# Patient Record
Sex: Female | Born: 1945
Health system: Southern US, Community
[De-identification: ages and names within clinical notes are randomized; demographics above are authoritative.]

## PROBLEM LIST (undated history)

## (undated) DIAGNOSIS — H409 Unspecified glaucoma: Secondary | ICD-10-CM

## (undated) DIAGNOSIS — T7840XA Allergy, unspecified, initial encounter: Secondary | ICD-10-CM

## (undated) DIAGNOSIS — E785 Hyperlipidemia, unspecified: Secondary | ICD-10-CM

## (undated) DIAGNOSIS — E119 Type 2 diabetes mellitus without complications: Secondary | ICD-10-CM

## (undated) DIAGNOSIS — Z0120 Encounter for dental examination and cleaning without abnormal findings: Secondary | ICD-10-CM

## (undated) DIAGNOSIS — I10 Essential (primary) hypertension: Secondary | ICD-10-CM

## (undated) HISTORY — DX: Unspecified glaucoma: H40.9

## (undated) HISTORY — DX: Type 2 diabetes mellitus without complications: E11.9

## (undated) HISTORY — DX: Hyperlipidemia, unspecified: E78.5

## (undated) HISTORY — DX: Essential (primary) hypertension: I10

## (undated) HISTORY — DX: Allergy, unspecified, initial encounter: T78.40XA

---
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
## 1986-07-29 HISTORY — PX: ABDOMINAL HYSTERECTOMY: SHX81

## 2007-03-02 ENCOUNTER — Ambulatory Visit: Payer: Self-pay

## 2008-03-10 ENCOUNTER — Ambulatory Visit: Payer: Self-pay

## 2009-01-26 HISTORY — PX: COLONOSCOPY: SHX174

## 2009-02-17 ENCOUNTER — Ambulatory Visit: Payer: Self-pay | Admitting: General Surgery

## 2009-03-16 ENCOUNTER — Ambulatory Visit: Payer: Self-pay

## 2010-03-08 ENCOUNTER — Ambulatory Visit: Payer: Self-pay

## 2011-02-28 ENCOUNTER — Ambulatory Visit: Payer: Self-pay

## 2012-03-05 ENCOUNTER — Ambulatory Visit: Payer: Self-pay | Admitting: Internal Medicine

## 2012-04-01 ENCOUNTER — Ambulatory Visit: Payer: Self-pay | Admitting: Family Medicine

## 2013-03-29 LAB — HM PAP SMEAR: HM Pap smear: NORMAL

## 2013-04-08 ENCOUNTER — Ambulatory Visit: Payer: Self-pay | Admitting: Internal Medicine

## 2013-04-27 LAB — HM MAMMOGRAPHY: HM MAMMO: NORMAL

## 2014-04-27 ENCOUNTER — Ambulatory Visit: Payer: Self-pay | Admitting: Internal Medicine

## 2015-01-11 DIAGNOSIS — T7840XA Allergy, unspecified, initial encounter: Secondary | ICD-10-CM | POA: Insufficient documentation

## 2015-01-11 DIAGNOSIS — R600 Localized edema: Secondary | ICD-10-CM | POA: Insufficient documentation

## 2015-01-11 DIAGNOSIS — I10 Essential (primary) hypertension: Secondary | ICD-10-CM | POA: Insufficient documentation

## 2015-01-13 ENCOUNTER — Encounter: Payer: Self-pay | Admitting: Internal Medicine

## 2015-01-13 ENCOUNTER — Ambulatory Visit (INDEPENDENT_AMBULATORY_CARE_PROVIDER_SITE_OTHER): Payer: 59 | Admitting: Internal Medicine

## 2015-01-13 ENCOUNTER — Encounter (INDEPENDENT_AMBULATORY_CARE_PROVIDER_SITE_OTHER): Payer: Self-pay

## 2015-01-13 VITALS — BP 142/88 | HR 64 | Ht 61.0 in | Wt 150.6 lb

## 2015-01-13 DIAGNOSIS — I10 Essential (primary) hypertension: Secondary | ICD-10-CM | POA: Diagnosis not present

## 2015-01-13 DIAGNOSIS — M5432 Sciatica, left side: Secondary | ICD-10-CM | POA: Diagnosis not present

## 2015-01-13 NOTE — Progress Notes (Signed)
Date:  01/13/2015   Name:  Bethany Martinez   DOB:  10-Mar-1946   MRN:  [id number]   Chief Complaint: Numbness HPI - had tingling in left foot and toes one day last week.  It started after lifting a pressure washer.  She had some low back discomfort as well but after taking an aspirin she has had no further issues.   Review of Systems:  Review of Systems  Respiratory: Negative for apnea, cough and shortness of breath.   Cardiovascular: Positive for leg swelling (for a few days). Negative for chest pain and palpitations.  Gastrointestinal: Negative for abdominal pain.  Genitourinary: Negative for dysuria, urgency and pelvic pain.  Musculoskeletal: Negative for back pain, joint swelling and gait problem.  Neurological: Negative for syncope, light-headedness, numbness and headaches.    Patient Active Problem List   Diagnosis Date Noted  . Dyslipidemia 01/11/2015  . Edema extremities 01/11/2015  . Allergic state 01/11/2015  . Essential (primary) hypertension 01/11/2015    Prior to Admission medications   Medication Sig Start Date End Date Taking? Authorizing Provider  amLODipine (NORVASC) 10 MG tablet Take 1 tablet by mouth daily. 06/08/14  Yes Historical Provider, MD  atorvastatin (LIPITOR) 20 MG tablet Take 1 tablet by mouth daily. 08/09/14  Yes Historical Provider, MD  hydrochlorothiazide (HYDRODIURIL) 25 MG tablet Take 1 tablet by mouth daily. 06/08/14  Yes Historical Provider, MD    No Known Allergies  Past Surgical History  Procedure Laterality Date  . Abdominal hysterectomy  1988    partial    History  Substance Use Topics  . Smoking status: Never Smoker   . Smokeless tobacco: Not on file  . Alcohol Use: No     Medication list has been reviewed and updated.  Physical Examination:  Physical Exam  Constitutional: She is oriented to person, place, and time. She appears well-developed and well-nourished.  Eyes: Pupils are equal, round, and reactive to light.   Neck: Neck supple. No thyromegaly present.  Cardiovascular: Normal rate, regular rhythm and normal heart sounds.   Pulses:      Dorsalis pedis pulses are 1+ on the right side, and 1+ on the left side.       Posterior tibial pulses are 1+ on the right side, and 1+ on the left side.  Pulmonary/Chest: Breath sounds normal. She has no wheezes.  Musculoskeletal: She exhibits no edema (trace ankle edema bilaterally).       Right hip: Normal.       Left hip: Normal.       Lumbar back: She exhibits no tenderness, no swelling and no edema.  Straight leg raise negative on left  Lymphadenopathy:    She has no cervical adenopathy.  Neurological: She is alert and oriented to person, place, and time. She has normal reflexes.  Skin: Skin is warm and dry.  Psychiatric: She has a normal mood and affect.    BP 142/88 mmHg  Pulse 64  Ht 5\' 1"  (1.549 m)  Wt 150 lb 9.6 oz (68.312 kg)  BMI 28.47 kg/m2  Assessment and Plan: 1. Essential (primary) hypertension Controlled - continue current therapy  2. Sciatica, left Now resolved - if recurrent may take aleve or aspirin Avoid heavy lifting   Halina Maidens, MD [facility]  01/13/2015

## 2015-04-12 ENCOUNTER — Encounter: Payer: Self-pay | Admitting: Internal Medicine

## 2015-04-12 ENCOUNTER — Ambulatory Visit (INDEPENDENT_AMBULATORY_CARE_PROVIDER_SITE_OTHER): Payer: 59 | Admitting: Internal Medicine

## 2015-04-12 VITALS — BP 142/78 | HR 76 | Ht 62.0 in | Wt 150.8 lb

## 2015-04-12 DIAGNOSIS — R21 Rash and other nonspecific skin eruption: Secondary | ICD-10-CM

## 2015-04-12 NOTE — Progress Notes (Signed)
Date:  04/12/2015   Name:  Bethany Martinez   DOB:  1946-02-17   MRN:  [id number]   Chief Complaint: Rash  patient developed a rash on the right side of her neck about 2 months ago. It was not symptomatic but she began applying cortisone cream. The rash is not improved or changed with regard to symptoms. The PA in urgent care told her to stop applying the cream and have it looked at. She denies any other rash on other parts of her body and no history of similar rash. She has not been seen by dermatology. Review of Systems:  Review of Systems  Constitutional: Negative.   Respiratory: Negative for chest tightness, shortness of breath and wheezing.   Cardiovascular: Negative for chest pain and palpitations.  Musculoskeletal: Negative for myalgias, joint swelling and arthralgias.  Skin: Positive for color change and rash.    Patient Active Problem List   Diagnosis Date Noted  . Dyslipidemia 01/11/2015  . Edema extremities 01/11/2015  . Allergic state 01/11/2015  . Essential (primary) hypertension 01/11/2015    Prior to Admission medications   Medication Sig Start Date End Date Taking? Authorizing Provider  amLODipine (NORVASC) 10 MG tablet Take 1 tablet by mouth daily. 06/08/14  Yes Historical Provider, MD  atorvastatin (LIPITOR) 20 MG tablet Take 1 tablet by mouth daily. 08/09/14  Yes Historical Provider, MD  brimonidine-timolol (COMBIGAN) 0.2-0.5 % ophthalmic solution Place 1 drop into both eyes every 12 (twelve) hours.   Yes Historical Provider, MD  hydrochlorothiazide (HYDRODIURIL) 25 MG tablet Take 1 tablet by mouth daily. 06/08/14  Yes Historical Provider, MD    No Known Allergies  Past Surgical History  Procedure Laterality Date  . Abdominal hysterectomy  1988    partial    Social History  Substance Use Topics  . Smoking status: Never Smoker   . Smokeless tobacco: None  . Alcohol Use: No     Medication list has been reviewed and updated.  Physical  Examination:  Physical Exam  Constitutional: She appears well-developed and well-nourished.  Neck: Normal range of motion. Neck supple. No thyromegaly present.  Cardiovascular: Normal rate, regular rhythm and normal heart sounds.   Pulmonary/Chest: Effort normal and breath sounds normal.  Lymphadenopathy:    She has no cervical adenopathy.  Skin: Skin is warm and dry. Rash noted. Rash is macular.  Multiple tiny hyperpigmented macules are noted over the right side of the neck and just anterior to the ear. These are nontender and noninflamed.  Nursing note and vitals reviewed.   BP 142/78 mmHg  Pulse 76  Ht 5\' 2"  (1.575 m)  Wt 150 lb 12.8 oz (68.402 kg)  BMI 27.57 kg/m2  Assessment and Plan: 1. Rash of neck Discontinue cortisone cream topically Schedule consultation with dermatology   Halina Maidens, MD [facility]  04/12/2015

## 2015-05-09 ENCOUNTER — Other Ambulatory Visit: Payer: Self-pay | Admitting: Internal Medicine

## 2015-05-09 DIAGNOSIS — Z1231 Encounter for screening mammogram for malignant neoplasm of breast: Secondary | ICD-10-CM

## 2015-05-23 ENCOUNTER — Ambulatory Visit
Admission: RE | Admit: 2015-05-23 | Discharge: 2015-05-23 | Disposition: A | Discharge status: Home / Self Care | Payer: 59 | Source: Ambulatory Visit | Attending: Internal Medicine | Admitting: Internal Medicine

## 2015-05-23 DIAGNOSIS — Z1231 Encounter for screening mammogram for malignant neoplasm of breast: Secondary | ICD-10-CM | POA: Insufficient documentation

## 2015-05-25 ENCOUNTER — Other Ambulatory Visit: Payer: Self-pay | Admitting: Internal Medicine

## 2015-06-07 ENCOUNTER — Ambulatory Visit (INDEPENDENT_AMBULATORY_CARE_PROVIDER_SITE_OTHER): Payer: 59 | Admitting: Internal Medicine

## 2015-06-07 ENCOUNTER — Encounter: Payer: Self-pay | Admitting: Internal Medicine

## 2015-06-07 VITALS — BP 136/76 | HR 68 | Ht 62.0 in | Wt 147.2 lb

## 2015-06-07 DIAGNOSIS — R7303 Prediabetes: Secondary | ICD-10-CM | POA: Diagnosis not present

## 2015-06-07 DIAGNOSIS — E785 Hyperlipidemia, unspecified: Secondary | ICD-10-CM

## 2015-06-07 DIAGNOSIS — Z23 Encounter for immunization: Secondary | ICD-10-CM | POA: Diagnosis not present

## 2015-06-07 DIAGNOSIS — R609 Edema, unspecified: Secondary | ICD-10-CM | POA: Diagnosis not present

## 2015-06-07 DIAGNOSIS — I10 Essential (primary) hypertension: Secondary | ICD-10-CM | POA: Diagnosis not present

## 2015-06-07 DIAGNOSIS — R600 Localized edema: Secondary | ICD-10-CM

## 2015-06-07 DIAGNOSIS — E118 Type 2 diabetes mellitus with unspecified complications: Secondary | ICD-10-CM | POA: Insufficient documentation

## 2015-06-07 DIAGNOSIS — E119 Type 2 diabetes mellitus without complications: Secondary | ICD-10-CM

## 2015-06-07 MED ORDER — ATORVASTATIN CALCIUM 20 MG PO TABS: 20.0000 mg | ORAL_TABLET | Freq: Every day | ORAL | Status: DC

## 2015-06-07 NOTE — Patient Instructions (Addendum)
Breast Self-Awareness Practicing breast self-awareness may pick up problems early, prevent significant medical complications, and possibly save your life. By practicing breast self-awareness, you can become familiar with how your breasts look and feel and if your breasts are changing. This allows you to notice changes early. It can also offer you some reassurance that your breast health is good. One way to learn what is normal for your breasts and whether your breasts are changing is to do a breast self-exam. If you find a lump or something that was not present in the past, it is best to contact your caregiver right away. Other findings that should be evaluated by your caregiver include nipple discharge, especially if it is bloody; skin changes or reddening; areas where the skin seems to be pulled in (retracted); or new lumps and bumps. Breast pain is seldom associated with cancer (malignancy), but should also be evaluated by a caregiver. HOW TO PERFORM A BREAST SELF-EXAM The best time to examine your breasts is 5-7 days after your menstrual period is over. During menstruation, the breasts are lumpier, and it may be more difficult to pick up changes. If you do not menstruate, have reached menopause, or had your uterus removed (hysterectomy), you should examine your breasts at regular intervals, such as monthly. If you are breastfeeding, examine your breasts after a feeding or after using a breast pump. Breast implants do not decrease the risk for lumps or tumors, so continue to perform breast self-exams as recommended. Talk to your caregiver about how to determine the difference between the implant and breast tissue. Also, talk about the amount of pressure you should use during the exam. Over time, you will become more familiar with the variations of your breasts and more comfortable with the exam. A breast self-exam requires you to remove all your clothes above the waist.  Look at your breasts and nipples.  Stand in front of a mirror in a room with good lighting. With your hands on your hips, push your hands firmly downward. Look for a difference in shape, contour, and size from one breast to the other (asymmetry). Asymmetry includes puckers, dips, or bumps. Also, look for skin changes, such as reddened or scaly areas on the breasts. Look for nipple changes, such as discharge, dimpling, repositioning, or redness.  Carefully feel your breasts. This is best done either in the shower or tub while using soapy water or when flat on your back. Place the arm (on the side of the breast you are examining) above your head. Use the pads (not the fingertips) of your three middle fingers on your opposite hand to feel your breasts. Start in the underarm area and use  inch (2 cm) overlapping circles to feel your breast. Use 3 different levels of pressure (light, medium, and firm pressure) at each circle before moving to the next circle. The light pressure is needed to feel the tissue closest to the skin. The medium pressure will help to feel breast tissue a little deeper, while the firm pressure is needed to feel the tissue close to the ribs. Continue the overlapping circles, moving downward over the breast until you feel your ribs below your breast. Then, move one finger-width towards the center of the body. Continue to use the  inch (2 cm) overlapping circles to feel your breast as you move slowly up toward the collar bone (clavicle) near the base of the neck. Continue the up and down exam using all 3 pressures until you reach the   middle of the chest. Do this with each breast, carefully feeling for lumps or changes.   Keep a written record with breast changes or normal findings for each breast. By writing this information down, you do not need to depend only on memory for size, tenderness, or location. Write down where you are in your menstrual cycle, if you are still menstruating. Breast tissue can have some lumps or thick  tissue. However, see your caregiver if you find anything that concerns you.  SEEK MEDICAL CARE IF:  You see a change in shape, contour, or size of your breasts or nipples.   You see skin changes, such as reddened or scaly areas on the breasts or nipples.   You have an unusual discharge from your nipples.   You feel a new lump or unusually thick areas.    This information is not intended to replace advice given to you by your health care provider. Make sure you discuss any questions you have with your health care provider.   Document Released: 07/15/2005 Document Revised: 07/01/2012 Document Reviewed: 10/30/2011 Elsevier Interactive Patient Education 2016 Elsevier Inc[employer] Pneumococcal Conjugate Vaccine (PCV13)  1. Why get vaccinated? Vaccination can protect both children and adults from pneumococcal disease. Pneumococcal disease is caused by bacteria that can spread from person to person through close contact. It can cause ear infections, and it can also lead to more serious infections of the:  Lungs (pneumonia),  Blood (bacteremia), and  Covering of the brain and spinal cord (meningitis). Pneumococcal pneumonia is most common among adults. Pneumococcal meningitis can cause deafness and brain damage, and it kills about 1 child in 10 who get it. Anyone can get pneumococcal disease, but children under 2 years of age and adults 65 years and older, people with certain medical conditions, and cigarette smokers are at the highest risk. Before there was a vaccine, the [place] saw:  more than 700 cases of meningitis,  about 13,000 blood infections,  about 5 million ear infections, and  about 200 deaths in children under 5 each year from pneumococcal disease. Since vaccine became available, severe pneumococcal disease in these children has fallen by 88%. About 18,000 older adults die of pneumococcal disease each year in the [place]. Treatment of pneumococcal infections with  penicillin and other drugs is not as effective as it used to be, because some strains of the disease have become resistant to these drugs. This makes prevention of the disease, through vaccination, even more important. 2. PCV13 vaccine Pneumococcal conjugate vaccine (called PCV13) protects against 13 types of pneumococcal bacteria. PCV13 is routinely given to children at 2, 4, 6, and 12-15 months of age. It is also recommended for children and adults 2 to 64 years of age with certain health conditions, and for all adults 65 years of age and older. Your doctor can give you details. 3. Some people should not get this vaccine Anyone who has ever had a life-threatening allergic reaction to a dose of this vaccine, to an earlier pneumococcal vaccine called PCV7, or to any vaccine containing diphtheria toxoid (for example, DTaP), should not get PCV13. Anyone with a severe allergy to any component of PCV13 should not get the vaccine. Tell your doctor if the person being vaccinated has any severe allergies. If the person scheduled for vaccination is not feeling well, your healthcare provider might decide to reschedule the shot on another day. 4. Risks of a vaccine reaction With any medicine, including vaccines, there is a chance of   reactions. These are usually mild and go away on their own, but serious reactions are also possible. Problems reported following PCV13 varied by age and dose in the series. The most common problems reported among children were:  About half became drowsy after the shot, had a temporary loss of appetite, or had redness or tenderness where the shot was given.  About 1 out of 3 had swelling where the shot was given.  About 1 out of 3 had a mild fever, and about 1 in 20 had a fever over 102.2F.  Up to about 8 out of 10 became fussy or irritable. Adults have reported pain, redness, and swelling where the shot was given; also mild fever, fatigue, headache, chills, or muscle  pain. Young children who get PCV13 along with inactivated flu vaccine at the same time may be at increased risk for seizures caused by fever. Ask your doctor for more information. Problems that could happen after any vaccine:  People sometimes faint after a medical procedure, including vaccination. Sitting or lying down for about 15 minutes can help prevent fainting, and injuries caused by a fall. Tell your doctor if you feel dizzy, or have vision changes or ringing in the ears.  Some older children and adults get severe pain in the shoulder and have difficulty moving the arm where a shot was given. This happens very rarely.  Any medication can cause a severe allergic reaction. Such reactions from a vaccine are very rare, estimated at about 1 in a million doses, and would happen within a few minutes to a few hours after the vaccination. As with any medicine, there is a very small chance of a vaccine causing a serious injury or death. The safety of vaccines is always being monitored. For more information, visit: www.cdc.gov/vaccinesafety/ 5. What if there is a serious reaction? What should I look for?  Look for anything that concerns you, such as signs of a severe allergic reaction, very high fever, or unusual behavior. Signs of a severe allergic reaction can include hives, swelling of the face and throat, difficulty breathing, a fast heartbeat, dizziness, and weakness-usually within a few minutes to a few hours after the vaccination. What should I do?  If you think it is a severe allergic reaction or other emergency that can't wait, call 9-1-1 or get the person to the nearest hospital. Otherwise, call your doctor. Reactions should be reported to the Vaccine Adverse Event Reporting System (VAERS). Your doctor should file this report, or you can do it yourself through the VAERS web site at www.vaers.hhs.gov, or by calling 1-800-822-7967. VAERS does not give medical advice. 6. The National Vaccine  Injury Compensation Program The National Vaccine Injury Compensation Program (VICP) is a federal program that was created to compensate people who may have been injured by certain vaccines. Persons who believe they may have been injured by a vaccine can learn about the program and about filing a claim by calling 1-800-338-2382 or visiting the VICP website at www.hrsa.gov/vaccinecompensation. There is a time limit to file a claim for compensation. 7. How can I learn more?  Ask your healthcare provider. He or she can give you the vaccine package insert or suggest other sources of information.  Call your local or state health department.  Contact the Centers for Disease Control and Prevention (CDC):  Call 1-800-232-4636 (1-800-CDC-INFO) or  Visit CDC's website at www.cdc.gov/vaccines Vaccine Information Statement PCV13 Vaccine (06/02/2014)   This information is not intended to replace advice given to you   by your health care provider. Make sure you discuss any questions you have with your health care provider.   Document Released: 05/12/2006 Document Revised: 08/05/2014 Document Reviewed: 06/09/2014 Elsevier Interactive Patient Education 2016 Elsevier Inc.  

## 2015-06-07 NOTE — Progress Notes (Signed)
Date:  06/07/2015   Name:  Bethany Martinez   DOB:  [birth year]   MRN:  [id number]   Chief Complaint: Hyperlipidemia and Hypertension Hyperlipidemia This is a chronic problem. The current episode started more than 1 year ago. The problem is controlled. Recent lipid tests were reviewed and are normal. Pertinent negatives include no chest pain or shortness of breath. Current antihyperlipidemic treatment includes statins. The current treatment provides significant improvement of lipids. There are no compliance problems.  Risk factors for coronary artery disease include dyslipidemia.  Hypertension This is a chronic problem. The current episode started more than 1 year ago. The problem is unchanged. The problem is controlled. Pertinent negatives include no blurred vision, chest pain, headaches, palpitations or shortness of breath. There are no associated agents to hypertension. The current treatment provides significant improvement. There are no compliance problems.   Diabetes She presents for her follow-up diabetic visit. Diabetes type: prediabetes. The initial diagnosis of diabetes was made 1 year ago. Disease course: A1C 6.3 last year. Pertinent negatives for hypoglycemia include no dizziness or headaches. Pertinent negatives for diabetes include no blurred vision, no chest pain, no fatigue, no polyphagia, no polyuria, no visual change and no weakness. Current diabetic treatment includes diet. Her weight is decreasing steadily (working with diet and exercise).     Review of Systems  Constitutional: Negative for chills, diaphoresis, fatigue and unexpected weight change.  HENT: Negative for sore throat, trouble swallowing and voice change.   Eyes: Negative for blurred vision.  Respiratory: Negative for cough, shortness of breath and wheezing.   Cardiovascular: Negative for chest pain, palpitations and leg swelling.  Gastrointestinal: Negative for abdominal pain.  Endocrine: Positive for cold  intolerance (has hx of anemia; not taking any vitamin or mineral supplements). Negative for polyphagia and polyuria.  Genitourinary: Negative for dysuria.  Musculoskeletal: Negative for back pain and joint swelling.  Neurological: Negative for dizziness, weakness, light-headedness and headaches.  Psychiatric/Behavioral: Negative for dysphoric mood.    Patient Active Problem List   Diagnosis Date Noted  . Dyslipidemia 01/11/2015  . Edema extremities 01/11/2015  . Allergic state 01/11/2015  . Essential (primary) hypertension 01/11/2015    Prior to Admission medications   Medication Sig Start Date End Date Taking? Authorizing Provider  amLODipine (NORVASC) 10 MG tablet TAKE 1 TABLET BY MOUTH ONCE DAILY 05/25/15  Yes Glean Hess, MD  atorvastatin (LIPITOR) 20 MG tablet Take 1 tablet by mouth daily. 08/09/14  Yes Historical Provider, MD  brimonidine-timolol (COMBIGAN) 0.2-0.5 % ophthalmic solution Place 1 drop into both eyes every 12 (twelve) hours.   Yes Historical Provider, MD  hydrochlorothiazide (HYDRODIURIL) 25 MG tablet TAKE 1 TABLET BY MOUTH ONCE DAILY 05/25/15  Yes Glean Hess, MD    No Known Allergies  Past Surgical History  Procedure Laterality Date  . Abdominal hysterectomy  1988    partial    Social History  Substance Use Topics  . Smoking status: Never Smoker   . Smokeless tobacco: None  . Alcohol Use: No      Medication list has been reviewed and updated.    Physical Exam  Constitutional: She is oriented to person, place, and time. She appears well-developed. No distress.  HENT:  Head: Normocephalic and atraumatic.  Eyes: Conjunctivae are normal. Right eye exhibits no discharge. Left eye exhibits no discharge. No scleral icterus.  Neck: Carotid bruit is not present.  Cardiovascular: Normal rate, regular rhythm and normal heart sounds.   Pulmonary/Chest: Effort normal and breath  sounds normal. No respiratory distress.  Abdominal: Soft. Normal  appearance and bowel sounds are normal.  Musculoskeletal: Normal range of motion.  Lymphadenopathy:    She has no cervical adenopathy.  Neurological: She is alert and oriented to person, place, and time.  Skin: Skin is warm and dry. No rash noted.  Psychiatric: She has a normal mood and affect. Her behavior is normal. Thought content normal.  Nursing note and vitals reviewed.   BP 136/76 mmHg  Pulse 68  Ht 5\' 2"  (1.575 m)  Wt 147 lb 3.2 oz (66.769 kg)  BMI 26.92 kg/m2  Assessment and Plan: 1. Essential (primary) hypertension Well-controlled on current regimen - CBC with Differential/Platelet - Comprehensive metabolic panel  2. Dyslipidemia Doing well on statin therapy - atorvastatin (LIPITOR) 20 MG tablet; Take 1 tablet (20 mg total) by mouth daily.  Dispense: 90 tablet; Refill: 3 - Lipid panel  3. Edema extremities Controlled with low dose diuretic - TSH  4. Pre-diabetes Continue slow weight loss with diet and exercise - Hemoglobin A1c  5. Need for pneumococcal vaccination - Pneumococcal conjugate vaccine 13-valent IM   Halina Maidens, MD [facility]  06/07/2015

## 2015-06-08 LAB — COMPREHENSIVE METABOLIC PANEL
ALBUMIN: 4.2 g/dL (ref 3.6–4.8)
ALK PHOS: 84 IU/L (ref 39–117)
ALT: 26 IU/L (ref 0–32)
AST: 23 IU/L (ref 0–40)
Albumin/Globulin Ratio: 1.3 (ref 1.1–2.5)
BUN / CREAT RATIO: 14 (ref 11–26)
BUN: 10 mg/dL (ref 8–27)
Bilirubin Total: 0.7 mg/dL (ref 0.0–1.2)
CO2: 26 mmol/L (ref 18–29)
CREATININE: 0.71 mg/dL (ref 0.57–1.00)
Calcium: 9.5 mg/dL (ref 8.7–10.3)
Chloride: 94 mmol/L — ABNORMAL LOW (ref 97–106)
GFR calc Af Amer: 100 mL/min/{1.73_m2} (ref >59)
GFR calc non Af Amer: 87 mL/min/{1.73_m2} (ref >59)
GLUCOSE: 106 mg/dL — AB (ref 65–99)
Globulin, Total: 3.2 g/dL (ref 1.5–4.5)
Potassium: 3.5 mmol/L (ref 3.5–5.2)
Sodium: 139 mmol/L (ref 136–144)
TOTAL PROTEIN: 7.4 g/dL (ref 6.0–8.5)

## 2015-06-08 LAB — CBC WITH DIFFERENTIAL/PLATELET
BASOS ABS: 0 10*3/uL (ref 0.0–0.2)
Basos: 0 %
EOS (ABSOLUTE): 0.1 10*3/uL (ref 0.0–0.4)
Eos: 1 %
HEMOGLOBIN: 12.7 g/dL (ref 11.1–15.9)
Hematocrit: 38.2 % (ref 34.0–46.6)
IMMATURE GRANS (ABS): 0 10*3/uL (ref 0.0–0.1)
Immature Granulocytes: 0 %
LYMPHS: 34 %
Lymphocytes Absolute: 1.9 10*3/uL (ref 0.7–3.1)
MCH: 27.7 pg (ref 26.6–33.0)
MCHC: 33.2 g/dL (ref 31.5–35.7)
MCV: 83 fL (ref 79–97)
MONOCYTES: 13 %
Monocytes Absolute: 0.7 10*3/uL (ref 0.1–0.9)
NEUTROS PCT: 52 %
Neutrophils Absolute: 2.9 10*3/uL (ref 1.4–7.0)
Platelets: 270 10*3/uL (ref 150–379)
RBC: 4.58 x10E6/uL (ref 3.77–5.28)
RDW: 15.3 % (ref 12.3–15.4)
WBC: 5.5 10*3/uL (ref 3.4–10.8)

## 2015-06-08 LAB — LIPID PANEL
CHOL/HDL RATIO: 3.6 ratio (ref 0.0–4.4)
CHOLESTEROL TOTAL: 171 mg/dL (ref 100–199)
HDL: 47 mg/dL (ref >39)
LDL Calculated: 103 mg/dL — ABNORMAL HIGH (ref 0–99)
TRIGLYCERIDES: 107 mg/dL (ref 0–149)
VLDL Cholesterol Cal: 21 mg/dL (ref 5–40)

## 2015-06-08 LAB — HEMOGLOBIN A1C
Est. average glucose Bld gHb Est-mCnc: 140 mg/dL
HEMOGLOBIN A1C: 6.5 % — AB (ref 4.8–5.6)

## 2015-06-08 LAB — TSH: TSH: 1.97 u[IU]/mL (ref 0.450–4.500)

## 2015-11-17 DIAGNOSIS — H40003 Preglaucoma, unspecified, bilateral: Secondary | ICD-10-CM | POA: Diagnosis not present

## 2015-12-05 ENCOUNTER — Encounter: Payer: Self-pay | Admitting: Internal Medicine

## 2015-12-05 ENCOUNTER — Other Ambulatory Visit: Payer: Self-pay | Admitting: Internal Medicine

## 2015-12-05 ENCOUNTER — Ambulatory Visit (INDEPENDENT_AMBULATORY_CARE_PROVIDER_SITE_OTHER): Payer: 59 | Admitting: Internal Medicine

## 2015-12-05 VITALS — BP 122/80 | HR 70 | Temp 97.7°F | Resp 16 | Ht 61.0 in | Wt 146.0 lb

## 2015-12-05 DIAGNOSIS — E785 Hyperlipidemia, unspecified: Secondary | ICD-10-CM

## 2015-12-05 DIAGNOSIS — E119 Type 2 diabetes mellitus without complications: Secondary | ICD-10-CM | POA: Diagnosis not present

## 2015-12-05 DIAGNOSIS — I10 Essential (primary) hypertension: Secondary | ICD-10-CM

## 2015-12-05 DIAGNOSIS — Z1159 Encounter for screening for other viral diseases: Secondary | ICD-10-CM | POA: Diagnosis not present

## 2015-12-05 DIAGNOSIS — E2839 Other primary ovarian failure: Secondary | ICD-10-CM | POA: Diagnosis not present

## 2015-12-05 DIAGNOSIS — E782 Mixed hyperlipidemia: Secondary | ICD-10-CM

## 2015-12-05 DIAGNOSIS — Z0000 Encounter for general adult medical examination without abnormal findings: Secondary | ICD-10-CM | POA: Diagnosis not present

## 2015-12-05 DIAGNOSIS — E1169 Type 2 diabetes mellitus with other specified complication: Secondary | ICD-10-CM | POA: Insufficient documentation

## 2015-12-05 LAB — POCT URINALYSIS DIPSTICK
Bilirubin, UA: NEGATIVE
Blood, UA: NEGATIVE
GLUCOSE UA: NEGATIVE
KETONES UA: NEGATIVE
Leukocytes, UA: NEGATIVE
Nitrite, UA: NEGATIVE
PROTEIN UA: NEGATIVE
SPEC GRAV UA: 1.01
Urobilinogen, UA: 0.2
pH, UA: 6

## 2015-12-05 MED ORDER — ATORVASTATIN CALCIUM 20 MG PO TABS: 20.0000 mg | ORAL_TABLET | Freq: Every day | ORAL | Status: DC

## 2015-12-05 NOTE — Patient Instructions (Signed)
DASH Eating Plan  DASH stands for "Dietary Approaches to Stop Hypertension." The DASH eating plan is a healthy eating plan that has been shown to reduce high blood pressure (hypertension). Additional health benefits may include reducing the risk of type 2 diabetes mellitus, heart disease, and stroke. The DASH eating plan may also help with weight loss.  WHAT DO I NEED TO KNOW ABOUT THE DASH EATING PLAN?  For the DASH eating plan, you will follow these general guidelines:  · Choose foods with a percent daily value for sodium of less than 5% (as listed on the food label).  · Use salt-free seasonings or herbs instead of table salt or sea salt.  · Check with your health care provider or pharmacist before using salt substitutes.  · Eat lower-sodium products, often labeled as "lower sodium" or "no salt added."  · Eat fresh foods.  · Eat more vegetables, fruits, and low-fat dairy products.  · Choose whole grains. Look for the word "whole" as the first word in the ingredient list.  · Choose fish and skinless chicken or turkey more often than red meat. Limit fish, poultry, and meat to 6 oz (170 g) each day.  · Limit sweets, desserts, sugars, and sugary drinks.  · Choose heart-healthy fats.  · Limit cheese to 1 oz (28 g) per day.  · Eat more home-cooked food and less restaurant, buffet, and fast food.  · Limit fried foods.  · Cook foods using methods other than frying.  · Limit canned vegetables. If you do use them, rinse them well to decrease the sodium.  · When eating at a restaurant, ask that your food be prepared with less salt, or no salt if possible.  WHAT FOODS CAN I EAT?  Seek help from a dietitian for individual calorie needs.  Grains  Whole grain or whole wheat bread. Brown rice. Whole grain or whole wheat pasta. Quinoa, bulgur, and whole grain cereals. Low-sodium cereals. Corn or whole wheat flour tortillas. Whole grain cornbread. Whole grain crackers. Low-sodium crackers.  Vegetables  Fresh or frozen vegetables  (raw, steamed, roasted, or grilled). Low-sodium or reduced-sodium tomato and vegetable juices. Low-sodium or reduced-sodium tomato sauce and paste. Low-sodium or reduced-sodium canned vegetables.   Fruits  All fresh, canned (in natural juice), or frozen fruits.  Meat and Other Protein Products  Ground beef (85% or leaner), grass-fed beef, or beef trimmed of fat. Skinless chicken or turkey. Ground chicken or turkey. Pork trimmed of fat. All fish and seafood. Eggs. Dried beans, peas, or lentils. Unsalted nuts and seeds. Unsalted canned beans.  Dairy  Low-fat dairy products, such as skim or 1% milk, 2% or reduced-fat cheeses, low-fat ricotta or cottage cheese, or plain low-fat yogurt. Low-sodium or reduced-sodium cheeses.  Fats and Oils  Tub margarines without trans fats. Light or reduced-fat mayonnaise and salad dressings (reduced sodium). Avocado. Safflower, olive, or canola oils. Natural peanut or almond butter.  Other  Unsalted popcorn and pretzels.  The items listed above may not be a complete list of recommended foods or beverages. Contact your dietitian for more options.  WHAT FOODS ARE NOT RECOMMENDED?  Grains  White bread. White pasta. White rice. Refined cornbread. Bagels and croissants. Crackers that contain trans fat.  Vegetables  Creamed or fried vegetables. Vegetables in a cheese sauce. Regular canned vegetables. Regular canned tomato sauce and paste. Regular tomato and vegetable juices.  Fruits  Dried fruits. Canned fruit in light or heavy syrup. Fruit juice.  Meat and Other Protein   Products  Fatty cuts of meat. Ribs, chicken wings, bacon, sausage, bologna, salami, chitterlings, fatback, hot dogs, bratwurst, and packaged luncheon meats. Salted nuts and seeds. Canned beans with salt.  Dairy  Whole or 2% milk, cream, half-and-half, and cream cheese. Whole-fat or sweetened yogurt. Full-fat cheeses or blue cheese. Nondairy creamers and whipped toppings. Processed cheese, cheese spreads, or cheese  curds.  Condiments  Onion and garlic salt, seasoned salt, table salt, and sea salt. Canned and packaged gravies. Worcestershire sauce. Tartar sauce. Barbecue sauce. Teriyaki sauce. Soy sauce, including reduced sodium. Steak sauce. Fish sauce. Oyster sauce. Cocktail sauce. Horseradish. Ketchup and mustard. Meat flavorings and tenderizers. Bouillon cubes. Hot sauce. Tabasco sauce. Marinades. Taco seasonings. Relishes.  Fats and Oils  Butter, stick margarine, lard, shortening, ghee, and bacon fat. Coconut, palm kernel, or palm oils. Regular salad dressings.  Other  Pickles and olives. Salted popcorn and pretzels.  The items listed above may not be a complete list of foods and beverages to avoid. Contact your dietitian for more information.  WHERE CAN I FIND MORE INFORMATION?  National Heart, Lung, and [employer] Institute: www.nhlbi.nih.gov/health/health-topics/topics/dash/     This information is not intended to replace advice given to you by your health care provider. Make sure you discuss any questions you have with your health care provider.     Document Released: 07/04/2011 Document Revised: 08/05/2014 Document Reviewed: 05/19/2013  Elsevier Interactive Patient Education ©2016 Else[employer] Inc.

## 2015-12-05 NOTE — Progress Notes (Signed)
Date:  12/05/2015   Name:  Bethany Martinez   DOB:  [birth year]   MRN:  [id number]   Chief Complaint: Annual Exam Bethany Martinez is a 70 y.o. female who presents today for her Complete Annual Exam. She feels well. She reports exercising walking daily. She reports she is sleeping fairly well.   Hypertension This is a chronic problem. The current episode started more than 1 year ago. The problem is unchanged. The problem is controlled. Pertinent negatives include no chest pain, headaches, palpitations or shortness of breath. Past treatments include calcium channel blockers and diuretics. The current treatment provides significant improvement. There are no compliance problems.   Hyperlipidemia This is a chronic problem. The current episode started more than 1 year ago. The problem is controlled. Recent lipid tests were reviewed and are normal. Pertinent negatives include no chest pain, focal weakness, myalgias or shortness of breath. Current antihyperlipidemic treatment includes statins. The current treatment provides significant improvement of lipids. There are no compliance problems.   Diabetes She presents for her follow-up diabetic visit. She has type 2 diabetes mellitus. The initial diagnosis of diabetes was made 6 months (last A1C 6.5) ago. Pertinent negatives for hypoglycemia include no dizziness, headaches, nervousness/anxiousness or tremors. Pertinent negatives for diabetes include no chest pain, no fatigue, no polydipsia and no polyuria. Symptoms are stable.  She was referred to [facility] but decided to work on diet and exercise instead.  Lab Results  Component Value Date   HGBA1C 6.5* 06/07/2015   Lab Results  Component Value Date   CREATININE 0.71 06/07/2015   Lab Results  Component Value Date   CHOL 171 06/07/2015   HDL 47 06/07/2015   LDLCALC 103* 06/07/2015   TRIG 107 06/07/2015   CHOLHDL 3.6 06/07/2015     Review of Systems  Constitutional: Negative  for fever, chills and fatigue.  HENT: Negative for congestion, hearing loss, tinnitus, trouble swallowing and voice change.   Eyes: Negative for visual disturbance.  Respiratory: Negative for cough, chest tightness, shortness of breath and wheezing.   Cardiovascular: Negative for chest pain, palpitations and leg swelling.  Gastrointestinal: Negative for vomiting, abdominal pain, diarrhea and constipation.  Endocrine: Negative for polydipsia and polyuria.  Genitourinary: Negative for dysuria, frequency, vaginal bleeding, vaginal discharge and genital sores.  Musculoskeletal: Negative for myalgias, joint swelling, arthralgias and gait problem.  Skin: Negative for color change and rash.  Neurological: Negative for dizziness, tremors, focal weakness, light-headedness and headaches.  Hematological: Negative for adenopathy. Does not bruise/bleed easily.  Psychiatric/Behavioral: Negative for sleep disturbance and dysphoric mood. The patient is not nervous/anxious.     Patient Active Problem List   Diagnosis Date Noted  . Type 2 diabetes mellitus without complication, without long-term current use of insulin ([place]) 12/05/2015  . Dyslipidemia 01/11/2015  . Edema extremities 01/11/2015  . Allergic state 01/11/2015  . Essential (primary) hypertension 01/11/2015    Prior to Admission medications   Medication Sig Start Date End Date Taking? Authorizing Provider  amLODipine (NORVASC) 10 MG tablet TAKE 1 TABLET BY MOUTH ONCE DAILY 05/25/15  Yes Glean Hess, MD  atorvastatin (LIPITOR) 20 MG tablet Take 1 tablet (20 mg total) by mouth daily. 06/07/15  Yes Glean Hess, MD  Calcium-Phosphorus-Vitamin D (CALCIUM/D3 ADULT GUMMIES PO) Take by mouth.   Yes Historical Provider, MD  hydrochlorothiazide (HYDRODIURIL) 25 MG tablet TAKE 1 TABLET BY MOUTH ONCE DAILY 05/25/15  Yes Glean Hess, MD    No Known Allergies  Past Surgical History  Procedure Laterality Date  . Abdominal hysterectomy   1988    partial    Social History  Substance Use Topics  . Smoking status: Never Smoker   . Smokeless tobacco: None  . Alcohol Use: No    Medication list has been reviewed and updated.  Physical Exam  Constitutional: She is oriented to person, place, and time. She appears well-developed and well-nourished. No distress.  HENT:  Head: Normocephalic and atraumatic.  Right Ear: Tympanic membrane and ear canal normal.  Left Ear: Tympanic membrane and ear canal normal.  Nose: Right sinus exhibits no maxillary sinus tenderness. Left sinus exhibits no maxillary sinus tenderness.  Mouth/Throat: Uvula is midline and oropharynx is clear and moist.  Eyes: Conjunctivae and EOM are normal. Right eye exhibits no discharge. Left eye exhibits no discharge. No scleral icterus.  Neck: Normal range of motion. Carotid bruit is not present. No erythema present. No thyromegaly present.  Cardiovascular: Normal rate, regular rhythm, normal heart sounds and normal pulses.   Pulmonary/Chest: Effort normal. No respiratory distress. She has no wheezes. Right breast exhibits no mass, no nipple discharge, no skin change and no tenderness. Left breast exhibits no mass, no nipple discharge, no skin change and no tenderness.  Abdominal: Soft. Bowel sounds are normal. There is no hepatosplenomegaly. There is no tenderness. There is no CVA tenderness.  Musculoskeletal: Normal range of motion.  Lymphadenopathy:    She has no cervical adenopathy.    She has no axillary adenopathy.  Neurological: She is alert and oriented to person, place, and time. She has normal reflexes. No cranial nerve deficit or sensory deficit.  Foot exam - normal pulses, sensation, skin and nails  Skin: Skin is warm, dry and intact. No rash noted.  Psychiatric: She has a normal mood and affect. Her speech is normal and behavior is normal. Thought content normal.  Nursing note and vitals reviewed.   BP 122/80 mmHg  Pulse 70  Temp(Src) 97.7 F  (36.5 C) (Oral)  Resp 16  Ht 5\' 1"  (1.549 m)  Wt 146 lb (66.225 kg)  BMI 27.60 kg/m2  SpO2 100%  Assessment and Plan: 1. Preventative health care Up to date  2. Essential (primary) hypertension controlled - POCT urinalysis dipstick - CBC with Differential/Platelet  3. Dyslipidemia On statin therapy - Lipid panel - atorvastatin (LIPITOR) 20 MG tablet; Take 1 tablet (20 mg total) by mouth daily.  Dispense: 90 tablet; Refill: 3  4. Type 2 diabetes mellitus without complication, without long-term current use of insulin (HCC) Check labs - if A1C higher, will refer to [facility] - Comprehensive metabolic panel - Hemoglobin A1c - Microalbumin / creatinine urine ratio - TSH  5. Need for hepatitis C screening test - Hepatitis C antibody  6. Menopause ovarian failure - DG Bone Density; Future   Halina Maidens, MD [facility]  12/05/2015

## 2015-12-06 LAB — HEMOGLOBIN A1C
Est. average glucose Bld gHb Est-mCnc: 140 mg/dL
HEMOGLOBIN A1C: 6.5 % — AB (ref 4.8–5.6)

## 2015-12-06 LAB — HEPATITIS C ANTIBODY: HEP C VIRUS AB: 0.1 {s_co_ratio} (ref 0.0–0.9)

## 2015-12-06 LAB — COMPREHENSIVE METABOLIC PANEL
A/G RATIO: 1.4 (ref 1.2–2.2)
ALBUMIN: 4.6 g/dL (ref 3.6–4.8)
ALK PHOS: 81 IU/L (ref 39–117)
ALT: 30 IU/L (ref 0–32)
AST: 25 IU/L (ref 0–40)
BILIRUBIN TOTAL: 0.9 mg/dL (ref 0.0–1.2)
BUN / CREAT RATIO: 17 (ref 12–28)
BUN: 11 mg/dL (ref 8–27)
CALCIUM: 10 mg/dL (ref 8.7–10.3)
CHLORIDE: 92 mmol/L — AB (ref 96–106)
CO2: 22 mmol/L (ref 18–29)
Creatinine, Ser: 0.66 mg/dL (ref 0.57–1.00)
GFR calc non Af Amer: 90 mL/min/{1.73_m2} (ref >59)
GFR, EST AFRICAN AMERICAN: 104 mL/min/{1.73_m2} (ref >59)
Globulin, Total: 3.2 g/dL (ref 1.5–4.5)
Glucose: 93 mg/dL (ref 65–99)
POTASSIUM: 3.2 mmol/L — AB (ref 3.5–5.2)
Sodium: 142 mmol/L (ref 134–144)
Total Protein: 7.8 g/dL (ref 6.0–8.5)

## 2015-12-06 LAB — CBC WITH DIFFERENTIAL/PLATELET
Basophils Absolute: 0 10*3/uL (ref 0.0–0.2)
Basos: 0 %
EOS (ABSOLUTE): 0 10*3/uL (ref 0.0–0.4)
EOS: 1 %
HEMATOCRIT: 38.2 % (ref 34.0–46.6)
HEMOGLOBIN: 13.3 g/dL (ref 11.1–15.9)
IMMATURE GRANS (ABS): 0 10*3/uL (ref 0.0–0.1)
IMMATURE GRANULOCYTES: 0 %
LYMPHS: 27 %
Lymphocytes Absolute: 1.7 10*3/uL (ref 0.7–3.1)
MCH: 28.6 pg (ref 26.6–33.0)
MCHC: 34.8 g/dL (ref 31.5–35.7)
MCV: 82 fL (ref 79–97)
MONOCYTES: 9 %
MONOS ABS: 0.6 10*3/uL (ref 0.1–0.9)
NEUTROS PCT: 63 %
Neutrophils Absolute: 4.1 10*3/uL (ref 1.4–7.0)
Platelets: 346 10*3/uL (ref 150–379)
RBC: 4.65 x10E6/uL (ref 3.77–5.28)
RDW: 14.7 % (ref 12.3–15.4)
WBC: 6.5 10*3/uL (ref 3.4–10.8)

## 2015-12-06 LAB — LIPID PANEL
Chol/HDL Ratio: 3.2 ratio units (ref 0.0–4.4)
Cholesterol, Total: 185 mg/dL (ref 100–199)
HDL: 58 mg/dL (ref >39)
LDL Calculated: 105 mg/dL — ABNORMAL HIGH (ref 0–99)
TRIGLYCERIDES: 112 mg/dL (ref 0–149)
VLDL Cholesterol Cal: 22 mg/dL (ref 5–40)

## 2015-12-06 LAB — TSH: TSH: 1.6 u[IU]/mL (ref 0.450–4.500)

## 2015-12-07 LAB — MICROALBUMIN / CREATININE URINE RATIO
Creatinine, Urine: 72.1 mg/dL
MICROALB/CREAT RATIO: 5.4 mg/g{creat} (ref 0.0–30.0)
Microalbumin, Urine: 3.9 ug/mL

## 2015-12-22 DIAGNOSIS — H40003 Preglaucoma, unspecified, bilateral: Secondary | ICD-10-CM | POA: Diagnosis not present

## 2016-04-15 ENCOUNTER — Ambulatory Visit
Admission: RE | Admit: 2016-04-15 | Discharge: 2016-04-15 | Disposition: A | Discharge status: Home / Self Care | Payer: 59 | Source: Ambulatory Visit | Attending: Internal Medicine | Admitting: Internal Medicine

## 2016-04-15 DIAGNOSIS — M810 Age-related osteoporosis without current pathological fracture: Secondary | ICD-10-CM | POA: Diagnosis not present

## 2016-04-15 DIAGNOSIS — E2839 Other primary ovarian failure: Secondary | ICD-10-CM | POA: Diagnosis not present

## 2016-04-15 DIAGNOSIS — M85852 Other specified disorders of bone density and structure, left thigh: Secondary | ICD-10-CM | POA: Diagnosis not present

## 2016-04-16 ENCOUNTER — Encounter: Payer: Self-pay | Admitting: Internal Medicine

## 2016-05-08 ENCOUNTER — Other Ambulatory Visit: Payer: Self-pay | Admitting: Internal Medicine

## 2016-05-08 DIAGNOSIS — Z1231 Encounter for screening mammogram for malignant neoplasm of breast: Secondary | ICD-10-CM

## 2016-05-21 DIAGNOSIS — H40003 Preglaucoma, unspecified, bilateral: Secondary | ICD-10-CM | POA: Diagnosis not present

## 2016-05-23 ENCOUNTER — Ambulatory Visit
Admission: RE | Admit: 2016-05-23 | Discharge: 2016-05-23 | Disposition: A | Discharge status: Home / Self Care | Payer: 59 | Source: Ambulatory Visit | Attending: Internal Medicine | Admitting: Internal Medicine

## 2016-05-23 DIAGNOSIS — Z1231 Encounter for screening mammogram for malignant neoplasm of breast: Secondary | ICD-10-CM | POA: Insufficient documentation

## 2016-08-23 ENCOUNTER — Other Ambulatory Visit: Payer: Self-pay | Admitting: Internal Medicine

## 2016-08-23 NOTE — Telephone Encounter (Signed)
pts coming in 01/16/17 for her cpe

## 2016-11-22 DIAGNOSIS — H40003 Preglaucoma, unspecified, bilateral: Secondary | ICD-10-CM | POA: Diagnosis not present

## 2016-11-22 LAB — HM DIABETES EYE EXAM

## 2017-01-16 ENCOUNTER — Ambulatory Visit (INDEPENDENT_AMBULATORY_CARE_PROVIDER_SITE_OTHER): Payer: Medicare Other | Admitting: Internal Medicine

## 2017-01-16 ENCOUNTER — Encounter: Payer: Self-pay | Admitting: Internal Medicine

## 2017-01-16 ENCOUNTER — Other Ambulatory Visit: Payer: Self-pay | Admitting: Internal Medicine

## 2017-01-16 VITALS — BP 132/66 | HR 88 | Ht 61.0 in | Wt 148.4 lb

## 2017-01-16 DIAGNOSIS — I10 Essential (primary) hypertension: Secondary | ICD-10-CM | POA: Diagnosis not present

## 2017-01-16 DIAGNOSIS — Z1231 Encounter for screening mammogram for malignant neoplasm of breast: Secondary | ICD-10-CM | POA: Diagnosis not present

## 2017-01-16 DIAGNOSIS — E119 Type 2 diabetes mellitus without complications: Secondary | ICD-10-CM

## 2017-01-16 DIAGNOSIS — Z0000 Encounter for general adult medical examination without abnormal findings: Secondary | ICD-10-CM | POA: Diagnosis not present

## 2017-01-16 DIAGNOSIS — E785 Hyperlipidemia, unspecified: Secondary | ICD-10-CM | POA: Diagnosis not present

## 2017-01-16 LAB — POCT URINALYSIS DIPSTICK
Glucose, UA: NEGATIVE
KETONES UA: NEGATIVE
Nitrite, UA: NEGATIVE
PH UA: 7 (ref 5.0–8.0)
Protein, UA: NEGATIVE
RBC UA: NEGATIVE
Urobilinogen, UA: 0.2 E.U./dL

## 2017-01-16 MED ORDER — AMLODIPINE BESYLATE 10 MG PO TABS: 10.0000 mg | ORAL_TABLET | Freq: Every day | ORAL | 3 refills | Status: DC

## 2017-01-16 MED ORDER — ATORVASTATIN CALCIUM 20 MG PO TABS: 20.0000 mg | ORAL_TABLET | Freq: Every day | ORAL | 3 refills | Status: DC

## 2017-01-16 MED ORDER — HYDROCHLOROTHIAZIDE 25 MG PO TABS: 25.0000 mg | ORAL_TABLET | Freq: Every day | ORAL | 3 refills | Status: DC

## 2017-01-16 MED ORDER — TRIAMCINOLONE ACETONIDE 0.1 % EX CREA: 1.0000 "application " | TOPICAL_CREAM | Freq: Two times a day (BID) | CUTANEOUS | 0 refills | Status: DC

## 2017-01-16 NOTE — Patient Instructions (Addendum)
Health Maintenance  Topic Date Due  . HEMOGLOBIN A1C  06/06/2016  . OPHTHALMOLOGY EXAM  11/19/2016  . FOOT EXAM  12/04/2016  . URINE MICROALBUMIN  12/04/2016  . INFLUENZA VACCINE  02/26/2017  . MAMMOGRAM  05/23/2018  . COLONOSCOPY  02/18/2019  . TETANUS/TDAP  11/28/2019  . DEXA SCAN  Completed  . Hepatitis C Screening  Completed  . PNA vac Low Risk Adult  Completed   Consider getting the new Shingles Vaccine - Shingrix

## 2017-01-16 NOTE — Progress Notes (Signed)
Patient: Bethany Martinez, Female    DOB: Mar 03, 1946, 72 y.o.   MRN: [id number] Visit Date: 01/16/2017  Today's Provider: Halina Maidens, MD   Chief Complaint  Patient presents with  . Medicare Wellness    pt is fasting. Breast Exam.    Subjective:    Annual wellness visit Bethany Martinez is a 71 y.o. female who presents today for her Subsequent Annual Wellness Visit. She feels well. She reports exercising regularly. She reports she is sleeping well. She denies breast problems.  Mammogram is due in October.  She retired in January and is enjoying doing yard work and helping care for her elderly sister.  ----------------------------------------------------------- Hypertension  This is a chronic problem. The problem is controlled (125/66). Pertinent negatives include no blurred vision, chest pain, headaches, palpitations or shortness of breath. Past treatments include calcium channel blockers and diuretics. The current treatment provides significant improvement.  Hyperlipidemia  This is a chronic problem. The problem is controlled. Pertinent negatives include no chest pain or shortness of breath. Current antihyperlipidemic treatment includes statins (taking 1/2 tablet daily).  Diabetes  She presents for her follow-up diabetic visit. She has type 2 diabetes mellitus. Her disease course has been stable. Pertinent negatives for hypoglycemia include no dizziness, headaches, nervousness/anxiousness or tremors. Pertinent negatives for diabetes include no blurred vision, no chest pain, no fatigue, no foot paresthesias, no polydipsia, no polyuria and no visual change. Current diabetic treatment includes diet. She is compliant with treatment most of the time (she does not check her BS).   Lab Results  Component Value Date   HGBA1C 6.5 (H) 12/05/2015   Lab Results  Component Value Date   CHOL 185 12/05/2015   HDL 58 12/05/2015   LDLCALC 105 (H) 12/05/2015   TRIG 112 12/05/2015   CHOLHDL 3.2 12/05/2015   Lab Results  Component Value Date   CREATININE 0.66 12/05/2015    Review of Systems  Constitutional: Negative for chills, fatigue and fever.  HENT: Negative for congestion, hearing loss, tinnitus, trouble swallowing and voice change.   Eyes: Negative for blurred vision and visual disturbance.  Respiratory: Negative for cough, chest tightness, shortness of breath and wheezing.   Cardiovascular: Negative for chest pain, palpitations and leg swelling.  Gastrointestinal: Negative for abdominal pain, constipation, diarrhea and vomiting.  Endocrine: Negative for polydipsia and polyuria.  Genitourinary: Negative for dysuria, frequency, genital sores, vaginal bleeding and vaginal discharge.  Musculoskeletal: Negative for arthralgias, gait problem and joint swelling.  Skin: Negative for color change and rash.  Neurological: Negative for dizziness, tremors, light-headedness and headaches.  Hematological: Negative for adenopathy. Does not bruise/bleed easily.  Psychiatric/Behavioral: Negative for dysphoric mood and sleep disturbance. The patient is not nervous/anxious.     Social History   Social History  . Marital status: Single    Spouse name: N/A  . Number of children: N/A  . Years of education: N/A   Occupational History  . Not on file.   Social History Main Topics  . Smoking status: Never Smoker  . Smokeless tobacco: Never Used  . Alcohol use No  . Drug use: No  . Sexual activity: Not on file   Other Topics Concern  . Not on file   Social History Narrative  . No narrative on file    Patient Active Problem List   Diagnosis Date Noted  . Type 2 diabetes mellitus without complication, without long-term current use of insulin ([place]) 12/05/2015  . Dyslipidemia 01/11/2015  . Edema extremities 01/11/2015  .  Allergic state 01/11/2015  . Essential (primary) hypertension 01/11/2015    Past Surgical History:  Procedure Laterality Date  . ABDOMINAL  HYSTERECTOMY  1988   partial  . COLONOSCOPY  01/2009   Dr. Jamal Collin    Her family history includes Breast cancer (age of onset: 32) in her paternal aunt; Breast cancer (age of onset: 55) in her sister.     Previous Medications   AMLODIPINE (NORVASC) 10 MG TABLET    TAKE 1 TABLET BY MOUTH ONCE DAILY   ASPIRIN EC 81 MG TABLET    Take 81 mg by mouth daily.   ATORVASTATIN (LIPITOR) 20 MG TABLET    Take 1 tablet (20 mg total) by mouth daily.   CALCIUM-PHOSPHORUS-VITAMIN D (CALCIUM/D3 ADULT GUMMIES PO)    Take by mouth.   HYDROCHLOROTHIAZIDE (HYDRODIURIL) 25 MG TABLET    TAKE 1 TABLET BY MOUTH ONCE DAILY   LATANOPROST (XALATAN) 0.005 % OPHTHALMIC SOLUTION    1 drop at bedtime.    Patient Care Team: Glean Hess, MD as PCP - General (Family Medicine)      Objective:   Vitals: BP 132/66   Pulse 88   Ht 5\' 1"  (1.549 m)   Wt 148 lb 6.4 oz (67.3 kg)   SpO2 100%   BMI 28.04 kg/m   Physical Exam  Constitutional: She is oriented to person, place, and time. She appears well-developed and well-nourished. No distress.  HENT:  Head: Normocephalic and atraumatic.  Right Ear: Tympanic membrane and ear canal normal.  Left Ear: Tympanic membrane and ear canal normal.  Nose: Right sinus exhibits no maxillary sinus tenderness. Left sinus exhibits no maxillary sinus tenderness.  Mouth/Throat: Uvula is midline and oropharynx is clear and moist.  Eyes: Conjunctivae and EOM are normal. Right eye exhibits no discharge. Left eye exhibits no discharge. No scleral icterus.  Neck: Normal range of motion. Carotid bruit is not present. No erythema present. No thyromegaly present.  Cardiovascular: Normal rate, regular rhythm, normal heart sounds and normal pulses.   Pulmonary/Chest: Effort normal. No respiratory distress. She has no wheezes. Right breast exhibits no mass, no nipple discharge, no skin change and no tenderness. Left breast exhibits no mass, no nipple discharge, no skin change and no  tenderness.  Abdominal: Soft. Bowel sounds are normal. There is no hepatosplenomegaly. There is no tenderness. There is no CVA tenderness.  Musculoskeletal: Normal range of motion. She exhibits no edema or tenderness.  Lymphadenopathy:    She has no cervical adenopathy.    She has no axillary adenopathy.  Neurological: She is alert and oriented to person, place, and time. She has normal reflexes. No cranial nerve deficit or sensory deficit.  Skin: Skin is warm, dry and intact. No rash noted.  Psychiatric: She has a normal mood and affect. Her speech is normal and behavior is normal. Thought content normal. Cognition and memory are normal.  Nursing note and vitals reviewed.   Activities of Daily Living In your present state of health, do you have any difficulty performing the following activities: 01/16/2017 01/16/2017  Hearing? N N  Vision? N N  Difficulty concentrating or making decisions? N N  Walking or climbing stairs? N N  Dressing or bathing? N N  Doing errands, shopping? N N  Preparing Food and eating ? N -  Using the Toilet? N -  In the past six months, have you accidently leaked urine? N -  Do you have problems with loss of bowel control? N -  Managing your Medications? N -  Managing your Finances? N -  Housekeeping or managing your Housekeeping? N -  Some recent data might be hidden    Fall Risk Assessment Fall Risk  01/16/2017 01/16/2017 12/05/2015 01/13/2015  Falls in the past year? No No No No    Depression Screen PHQ 2/9 Scores 01/16/2017 01/16/2017 12/05/2015 01/13/2015  PHQ - 2 Score 0 0 0 0   6CIT Screen 01/16/2017  What Year? 0 points  What month? 0 points  What time? 0 points  Count back from 20 0 points  Months in reverse 0 points  Repeat phrase 0 points  Total Score 0    Medicare Annual Wellness Visit Summary:  Reviewed patient's Family Medical History Reviewed and updated list of patient's medical providers Assessment of cognitive impairment was  done Assessed patient's functional ability Established a written schedule for health screening [place] Completed and Reviewed  Exercise Activities and Dietary recommendations Goals    None      Immunization History  Administered Date(s) Administered  . Influenza-Unspecified 05/11/2015  . Pneumococcal Conjugate-13 06/07/2015  . Pneumococcal-Unspecified 12/21/2012  . Tdap 11/27/2009  . Zoster 01/11/2011    Health Maintenance  Topic Date Due  . HEMOGLOBIN A1C  06/06/2016  . OPHTHALMOLOGY EXAM  11/19/2016  . FOOT EXAM  12/04/2016  . URINE MICROALBUMIN  12/04/2016  . INFLUENZA VACCINE  02/26/2017  . MAMMOGRAM  05/23/2018  . COLONOSCOPY  02/18/2019  . TETANUS/TDAP  11/28/2019  . DEXA SCAN  Completed  . Hepatitis C Screening  Completed  . PNA vac Low Risk Adult  Completed    Discussed health benefits of physical activity, and encouraged her to engage in regular exercise appropriate for her age and condition.    ------------------------------------------------------------------------------------------------------------  Assessment & Plan:   1. Medicare annual wellness visit, subsequent Measures satisfied  2. Essential (primary) hypertension controlled - hydrochlorothiazide (HYDRODIURIL) 25 MG tablet; Take 1 tablet (25 mg total) by mouth daily.  Dispense: 90 tablet; Refill: 3 - amLODipine (NORVASC) 10 MG tablet; Take 1 tablet (10 mg total) by mouth daily.  Dispense: 90 tablet; Refill: 3 - CBC with Differential/Platelet - POCT urinalysis dipstick  3. Type 2 diabetes mellitus without complication, without long-term current use of insulin (HCC) Continue carb restricted diet - Comprehensive metabolic panel - Hemoglobin A1c - Microalbumin / creatinine urine ratio - TSH  4. Dyslipidemia Continue 1/2 lipitor (10 mg) - atorvastatin (LIPITOR) 20 MG tablet; Take 1 tablet (20 mg total) by mouth daily.  Dispense: 90 tablet; Refill: 3 - Lipid panel  5.  Encounter for screening mammogram for breast cancer - MM DIGITAL SCREENING BILATERAL; Future   Meds ordered this encounter  Medications  . triamcinolone cream (KENALOG) 0.1 %    Sig: Apply 1 application topically 2 (two) times daily.    Dispense:  30 g    Refill:  0  . hydrochlorothiazide (HYDRODIURIL) 25 MG tablet    Sig: Take 1 tablet (25 mg total) by mouth daily.    Dispense:  90 tablet    Refill:  3  . atorvastatin (LIPITOR) 20 MG tablet    Sig: Take 1 tablet (20 mg total) by mouth daily.    Dispense:  90 tablet    Refill:  3  . amLODipine (NORVASC) 10 MG tablet    Sig: Take 1 tablet (10 mg total) by mouth daily.    Dispense:  90 tablet    Refill:  3    Mickel Baas  Army Melia, [facility]  01/16/2017

## 2017-01-17 ENCOUNTER — Encounter: Payer: Self-pay | Admitting: Internal Medicine

## 2017-01-17 LAB — HEMOGLOBIN A1C
Est. average glucose Bld gHb Est-mCnc: 128 mg/dL
Hgb A1c MFr Bld: 6.1 % — ABNORMAL HIGH (ref 4.8–5.6)

## 2017-01-17 LAB — MICROALBUMIN / CREATININE URINE RATIO
Creatinine, Urine: 133.7 mg/dL
MICROALB/CREAT RATIO: 4.6 mg/g{creat} (ref 0.0–30.0)
MICROALBUM., U, RANDOM: 6.2 ug/mL

## 2017-01-17 LAB — CBC WITH DIFFERENTIAL/PLATELET
BASOS ABS: 0 10*3/uL (ref 0.0–0.2)
BASOS: 0 %
EOS (ABSOLUTE): 0 10*3/uL (ref 0.0–0.4)
Eos: 1 %
HEMOGLOBIN: 13.5 g/dL (ref 11.1–15.9)
Hematocrit: 41.2 % (ref 34.0–46.6)
IMMATURE GRANS (ABS): 0 10*3/uL (ref 0.0–0.1)
Immature Granulocytes: 0 %
LYMPHS: 30 %
Lymphocytes Absolute: 1.8 10*3/uL (ref 0.7–3.1)
MCH: 27.9 pg (ref 26.6–33.0)
MCHC: 32.8 g/dL (ref 31.5–35.7)
MCV: 85 fL (ref 79–97)
Monocytes Absolute: 0.3 10*3/uL (ref 0.1–0.9)
Monocytes: 5 %
NEUTROS ABS: 4 10*3/uL (ref 1.4–7.0)
Neutrophils: 64 %
Platelets: 312 10*3/uL (ref 150–379)
RBC: 4.84 x10E6/uL (ref 3.77–5.28)
RDW: 15.4 % (ref 12.3–15.4)
WBC: 6.2 10*3/uL (ref 3.4–10.8)

## 2017-01-17 LAB — COMPREHENSIVE METABOLIC PANEL
ALBUMIN: 4.7 g/dL (ref 3.5–4.8)
ALT: 29 IU/L (ref 0–32)
AST: 25 IU/L (ref 0–40)
Albumin/Globulin Ratio: 1.5 (ref 1.2–2.2)
Alkaline Phosphatase: 81 IU/L (ref 39–117)
BILIRUBIN TOTAL: 0.8 mg/dL (ref 0.0–1.2)
BUN / CREAT RATIO: 13 (ref 12–28)
BUN: 11 mg/dL (ref 8–27)
CHLORIDE: 96 mmol/L (ref 96–106)
CO2: 26 mmol/L (ref 20–29)
Calcium: 10.3 mg/dL (ref 8.7–10.3)
Creatinine, Ser: 0.88 mg/dL (ref 0.57–1.00)
GFR calc Af Amer: 77 mL/min/{1.73_m2} (ref >59)
GFR calc non Af Amer: 67 mL/min/{1.73_m2} (ref >59)
GLUCOSE: 100 mg/dL — AB (ref 65–99)
Globulin, Total: 3.2 g/dL (ref 1.5–4.5)
Potassium: 3.4 mmol/L — ABNORMAL LOW (ref 3.5–5.2)
Sodium: 141 mmol/L (ref 134–144)
TOTAL PROTEIN: 7.9 g/dL (ref 6.0–8.5)

## 2017-01-17 LAB — LIPID PANEL
Chol/HDL Ratio: 3.5 ratio (ref 0.0–4.4)
Cholesterol, Total: 208 mg/dL — ABNORMAL HIGH (ref 100–199)
HDL: 60 mg/dL (ref >39)
LDL Calculated: 122 mg/dL — ABNORMAL HIGH (ref 0–99)
Triglycerides: 132 mg/dL (ref 0–149)
VLDL CHOLESTEROL CAL: 26 mg/dL (ref 5–40)

## 2017-01-17 LAB — TSH: TSH: 1.42 u[IU]/mL (ref 0.450–4.500)

## 2017-01-23 IMAGING — MG MM DIGITAL SCREENING BILAT W/ CAD
1 series · 4 of 4 positions shown · non-contrast
Comparison: Previous exam(s).

CLINICAL DATA: Screening.

EXAM:
DIGITAL SCREENING BILATERAL MAMMOGRAM WITH CAD

[R CC · right · 4 of 4 slices shown]
[im 1/4]
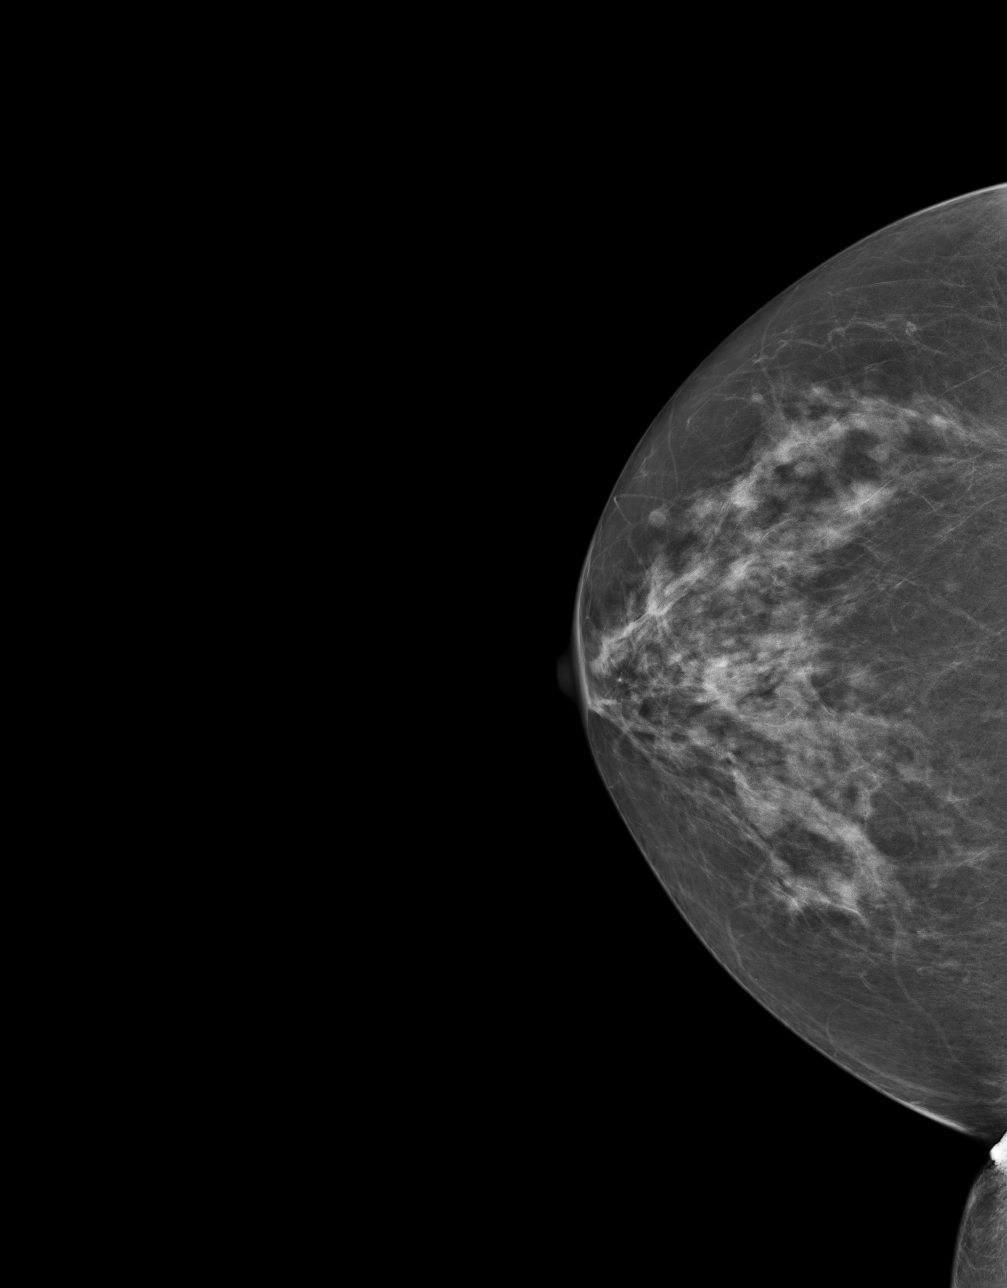
[im 2/4]
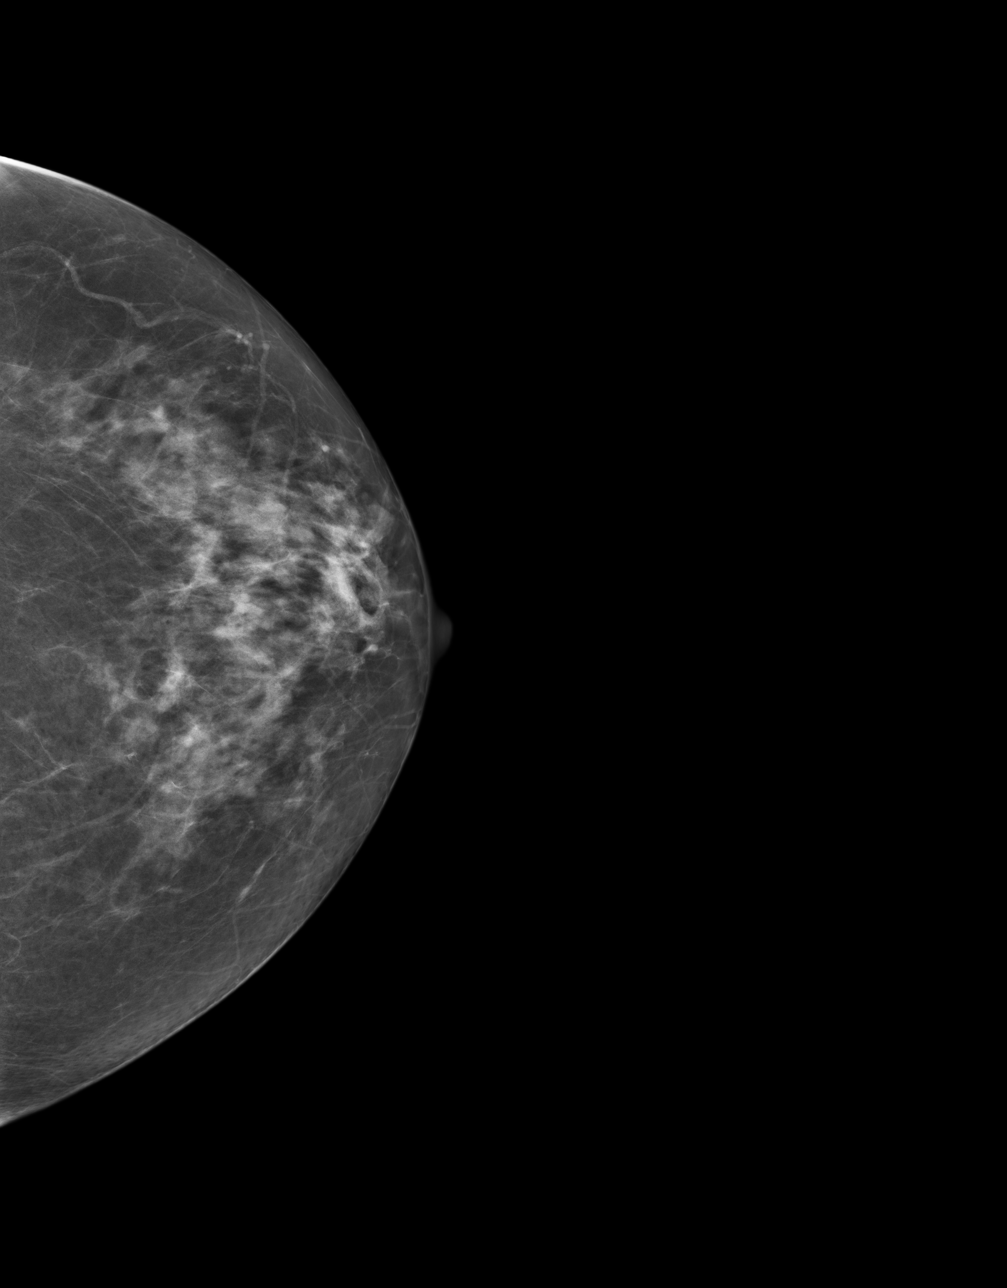
[im 3/4]
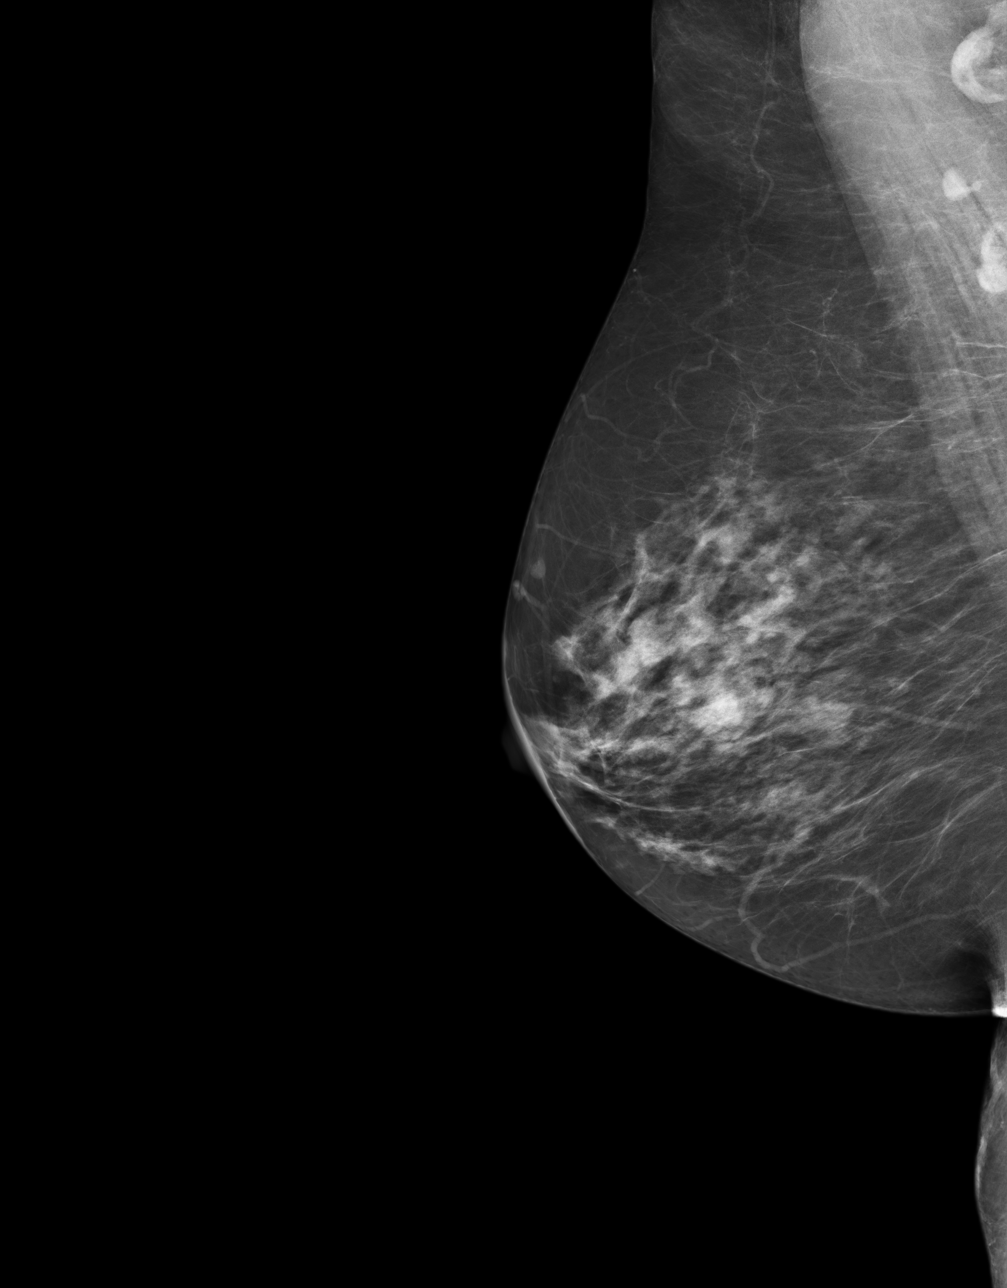
[im 4/4]
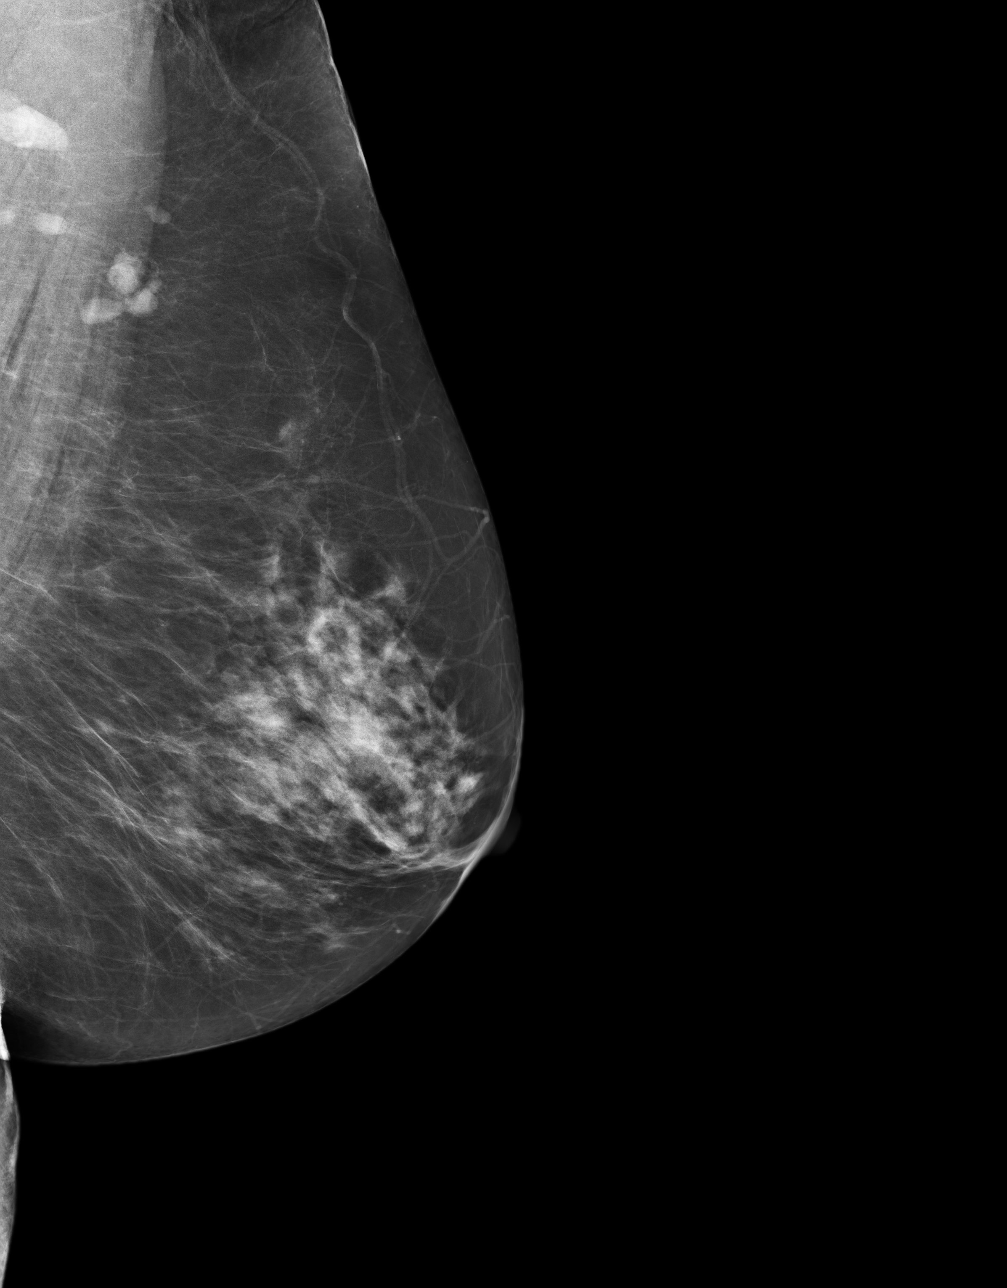

[4 of 4 positions shown; findings below may reference images not displayed]

ACR Breast Density Category c: The breast tissue is heterogeneously
dense, which may obscure small masses.
FINDINGS: There are no findings suspicious for malignancy. Images were
processed with CAD.
IMPRESSION: No mammographic evidence of malignancy. A result letter of this
screening mammogram will be mailed directly to the patient.

RECOMMENDATION:
Screening mammogram in one year. (Code:YJ-[id number])

BI-RADS CATEGORY  1: Negative.

## 2017-05-23 DIAGNOSIS — Z23 Encounter for immunization: Secondary | ICD-10-CM | POA: Diagnosis not present

## 2017-05-27 DIAGNOSIS — H40003 Preglaucoma, unspecified, bilateral: Secondary | ICD-10-CM | POA: Diagnosis not present

## 2017-05-28 ENCOUNTER — Ambulatory Visit
Admission: RE | Admit: 2017-05-28 | Discharge: 2017-05-28 | Disposition: A | Discharge status: Home / Self Care | Payer: Medicare Other | Source: Ambulatory Visit | Attending: Internal Medicine | Admitting: Internal Medicine

## 2017-05-28 DIAGNOSIS — Z1231 Encounter for screening mammogram for malignant neoplasm of breast: Secondary | ICD-10-CM | POA: Insufficient documentation

## 2017-05-30 DIAGNOSIS — H2513 Age-related nuclear cataract, bilateral: Secondary | ICD-10-CM | POA: Diagnosis not present

## 2017-07-18 ENCOUNTER — Encounter: Payer: Self-pay | Admitting: Internal Medicine

## 2017-07-18 ENCOUNTER — Ambulatory Visit (INDEPENDENT_AMBULATORY_CARE_PROVIDER_SITE_OTHER): Payer: Medicare Other | Admitting: Internal Medicine

## 2017-07-18 VITALS — BP 130/78 | HR 91 | Ht 61.0 in | Wt 147.0 lb

## 2017-07-18 DIAGNOSIS — L309 Dermatitis, unspecified: Secondary | ICD-10-CM | POA: Diagnosis not present

## 2017-07-18 DIAGNOSIS — I10 Essential (primary) hypertension: Secondary | ICD-10-CM | POA: Diagnosis not present

## 2017-07-18 DIAGNOSIS — E119 Type 2 diabetes mellitus without complications: Secondary | ICD-10-CM | POA: Diagnosis not present

## 2017-07-18 MED ORDER — TRIAMCINOLONE ACETONIDE 0.1 % EX OINT: 1.0000 "application " | TOPICAL_OINTMENT | Freq: Two times a day (BID) | CUTANEOUS | 0 refills | Status: DC

## 2017-07-18 NOTE — Progress Notes (Signed)
Date:  07/18/2017   Name:  Bethany Martinez   DOB:  11-Jul-1946   MRN:  [id number]   Chief Complaint: Hypertension and Diabetes Hypertension  This is a chronic problem. The problem is controlled. Pertinent negatives include no chest pain, headaches, palpitations or shortness of breath. The current treatment provides significant improvement.  Diabetes  She presents for her follow-up diabetic visit. She has type 2 diabetes mellitus. Her disease course has been stable. Pertinent negatives for hypoglycemia include no headaches or tremors. Pertinent negatives for diabetes include no chest pain, no fatigue, no polydipsia and no polyuria. Current diabetic treatment includes diet. She is compliant with treatment most of the time. An ACE inhibitor/angiotensin II receptor blocker is not being taken. Eye exam is current.  Rash  This is a recurrent problem. The problem has been waxing and waning since onset. The affected locations include the left elbow and right elbow. The rash is characterized by scaling, redness and itchiness. Pertinent negatives include no cough, fatigue, fever or shortness of breath.   Lab Results  Component Value Date   HGBA1C 6.1 (H) 01/16/2017    Review of Systems  Constitutional: Negative for appetite change, fatigue, fever and unexpected weight change.  HENT: Negative for tinnitus and trouble swallowing.   Eyes: Negative for visual disturbance.  Respiratory: Negative for cough, chest tightness and shortness of breath.   Cardiovascular: Negative for chest pain, palpitations and leg swelling.  Gastrointestinal: Negative for abdominal pain.  Endocrine: Negative for polydipsia and polyuria.  Genitourinary: Negative for dysuria and hematuria.  Musculoskeletal: Negative for arthralgias.  Skin: Positive for rash.  Neurological: Negative for tremors, numbness and headaches.  Psychiatric/Behavioral: Negative for dysphoric mood.    Patient Active Problem List   Diagnosis Date Noted  . Type 2 diabetes mellitus without complication, without long-term current use of insulin ([place]) 12/05/2015  . Dyslipidemia 01/11/2015  . Edema extremities 01/11/2015  . Allergic state 01/11/2015  . Essential (primary) hypertension 01/11/2015    Prior to Admission medications   Medication Sig Start Date End Date Taking? Authorizing Provider  amLODipine (NORVASC) 10 MG tablet Take 1 tablet (10 mg total) by mouth daily. 01/16/17  Yes Glean Hess, MD  aspirin EC 81 MG tablet Take 81 mg by mouth daily.   Yes [provider]  atorvastatin (LIPITOR) 20 MG tablet Take 1 tablet (20 mg total) by mouth daily. 01/16/17  Yes Glean Hess, MD  Calcium-Phosphorus-Vitamin D (CALCIUM/D3 ADULT GUMMIES PO) Take by mouth.   Yes [provider]  hydrochlorothiazide (HYDRODIURIL) 25 MG tablet Take 1 tablet (25 mg total) by mouth daily. 01/16/17  Yes Glean Hess, MD  latanoprost (XALATAN) 0.005 % ophthalmic solution 1 drop at bedtime.   Yes [provider]  triamcinolone cream (KENALOG) 0.1 % Apply 1 application topically 2 (two) times daily. 01/16/17  Yes Glean Hess, MD    No Known Allergies  Past Surgical History:  Procedure Laterality Date  . ABDOMINAL HYSTERECTOMY  1988   partial  . COLONOSCOPY  01/2009   Dr. Jamal Collin    Social History   Tobacco Use  . Smoking status: Never Smoker  . Smokeless tobacco: Never Used  Substance Use Topics  . Alcohol use: No    Alcohol/week: 0.0 oz  . Drug use: No     Medication list has been reviewed and updated.  PHQ 2/9 Scores 01/16/2017 01/16/2017 12/05/2015 01/13/2015  PHQ - 2 Score 0 0 0 0  Physical Exam  Constitutional: She is oriented to person, place, and time. She appears well-developed. No distress.  HENT:  Head: Normocephalic and atraumatic.  Neck: Normal range of motion. Neck supple. No thyromegaly present.  Cardiovascular: Normal rate, regular rhythm and normal heart sounds.    Pulmonary/Chest: Effort normal and breath sounds normal. No respiratory distress. She has no wheezes.  Musculoskeletal: Normal range of motion. She exhibits no edema or tenderness.  Neurological: She is alert and oriented to person, place, and time.  Skin: Skin is warm and dry. Rash noted.  Eczematous rash on elbows  Psychiatric: She has a normal mood and affect. Her behavior is normal. Thought content normal.  Nursing note and vitals reviewed.   BP 130/78   Pulse 91   Ht 5\' 1"  (1.549 m)   Wt 147 lb (66.7 kg)   SpO2 100%   BMI 27.78 kg/m   Assessment and Plan: 1. Essential (primary) hypertension controlled  2. Type 2 diabetes mellitus without complication, without long-term current use of insulin (H[facility]) Doing well on diet - Hemoglobin A1c  3. Eczema, unspecified type - triamcinolone ointment (KENALOG) 0.1 %; Apply 1 application topically 2 (two) times daily.  Dispense: 30 g; Refill: 0   Meds ordered this encounter  Medications  . triamcinolone ointment (KENALOG) 0.1 %    Sig: Apply 1 application topically 2 (two) times daily.    Dispense:  30 g    Refill:  0    Partially dictated using Editor, commissioning. Any errors are unintentional.  Halina Maidens, MD [facility]  07/18/2017

## 2017-07-19 LAB — HEMOGLOBIN A1C
ESTIMATED AVERAGE GLUCOSE: 128 mg/dL
Hgb A1c MFr Bld: 6.1 % — ABNORMAL HIGH (ref 4.8–5.6)

## 2017-09-09 DIAGNOSIS — D239 Other benign neoplasm of skin, unspecified: Secondary | ICD-10-CM | POA: Diagnosis not present

## 2017-09-09 DIAGNOSIS — L821 Other seborrheic keratosis: Secondary | ICD-10-CM | POA: Diagnosis not present

## 2017-11-28 DIAGNOSIS — H40003 Preglaucoma, unspecified, bilateral: Secondary | ICD-10-CM | POA: Diagnosis not present

## 2017-11-28 LAB — HM DIABETES EYE EXAM

## 2017-12-31 ENCOUNTER — Ambulatory Visit: Payer: Medicare Other

## 2018-01-05 ENCOUNTER — Ambulatory Visit: Payer: Medicare Other | Admitting: Internal Medicine

## 2018-01-19 ENCOUNTER — Ambulatory Visit (INDEPENDENT_AMBULATORY_CARE_PROVIDER_SITE_OTHER): Payer: Medicare Other

## 2018-01-19 VITALS — BP 112/62 | HR 73 | Temp 98.5°F | Resp 12 | Ht 61.0 in | Wt 143.2 lb

## 2018-01-19 DIAGNOSIS — E2839 Other primary ovarian failure: Secondary | ICD-10-CM | POA: Diagnosis not present

## 2018-01-19 DIAGNOSIS — Z0000 Encounter for general adult medical examination without abnormal findings: Secondary | ICD-10-CM

## 2018-01-19 DIAGNOSIS — M810 Age-related osteoporosis without current pathological fracture: Secondary | ICD-10-CM | POA: Diagnosis not present

## 2018-01-19 DIAGNOSIS — Z1231 Encounter for screening mammogram for malignant neoplasm of breast: Secondary | ICD-10-CM | POA: Diagnosis not present

## 2018-01-19 DIAGNOSIS — Z1239 Encounter for other screening for malignant neoplasm of breast: Secondary | ICD-10-CM

## 2018-01-19 NOTE — Progress Notes (Signed)
Subjective:   Bethany Martinez is a 72 y.o. female who presents for Medicare Annual (Subsequent) preventive examination.  Review of Systems:  N/A Cardiac Risk Factors include: advanced age (>68men, >41 women);dyslipidemia;hypertension;diabetes mellitus;sedentary lifestyle     Objective:     Vitals: BP 112/62 (BP Location: Right Arm, Patient Position: Sitting, Cuff Size: Normal)   Pulse 73   Temp 98.5 F (36.9 C) (Oral)   Resp 12   Ht 5\' 1"  (1.549 m)   Wt 143 lb 3.2 oz (65 kg)   SpO2 99%   BMI 27.06 kg/m   Body mass index is 27.06 kg/m.  Advanced Directives 01/19/2018 12/05/2015  Does Patient Have a Medical Advance Directive? Yes Yes  Type of Paramedic of [place];Living will -  Copy of [place] in Chart? No - copy requested No - copy requested    Tobacco Social History   Tobacco Use  Smoking Status Never Smoker  Smokeless Tobacco Never Used  Tobacco Comment   smoking cessation materials not required     Counseling given: No Comment: smoking cessation materials not required  Clinical Intake:  Pre-visit preparation completed: Yes  Pain : No/denies pain   BMI - recorded: 27.06 Nutritional Status: BMI 25 -29 Overweight Nutritional Risks: None  Nutrition Risk Assessment: Has the patient had any N/V/D within the last 2 months?  No Does the patient have any non-healing wounds?  No Has the patient had any unintentional weight loss or weight gain?  No  Is the patient diabetic?  Yes If diabetic, was a CBG obtained today?  No Did the patient bring in their glucometer from home?  No Comments: Pt does not monitor CBG's. Denies any financial strains with the device or supplies.  Diabetic Exams: Diabetic Eye Exam: Completed 11/28/17. Diabetic Foot Exam: Completed 01/16/17. Pt has been advised about the importance in completing this exam. Advised to keep appt as scheduled with Dr. Army Melia. Verbalized acceptance and  understanding.  How often do you need to have someone help you when you read instructions, pamphlets, or other written materials from your doctor or pharmacy?: 1 - Never  Interpreter Needed?: No  Information entered by :: [place], LPN  History reviewed. No pertinent past medical history. Past Surgical History:  Procedure Laterality Date  . ABDOMINAL HYSTERECTOMY  1988   partial  . COLONOSCOPY  01/2009   Dr. Jamal Collin   Family History  Problem Relation Age of Onset  . Breast cancer Sister 66  . Cancer Sister        colon  . Dementia Sister   . Breast cancer Paternal Aunt 62  . Hypertension Mother   . Hypertension Father   . Heart disease Brother        CABG   Social History   Socioeconomic History  . Marital status: Divorced    Spouse name: Not on file  . Number of children: 2  . Years of education: some college  . Highest education level: 12th grade  Occupational History  . Occupation: Retired  Scientific laboratory technician  . Financial resource strain: Not hard at all  . Food insecurity:    Worry: Never true    Inability: Never true  . Transportation needs:    Medical: No    Non-medical: No  Tobacco Use  . Smoking status: Never Smoker  . Smokeless tobacco: Never Used  . Tobacco comment: smoking cessation materials not required  Substance and Sexual Activity  . Alcohol use: No  Alcohol/week: 0.0 oz  . Drug use: No  . Sexual activity: Not Currently  Lifestyle  . Physical activity:    Days per week: 0 days    Minutes per session: 0 min  . Stress: Not at all  Relationships  . Social connections:    Talks on phone: Patient refused    Gets together: Patient refused    Attends religious service: Patient refused    Active member of club or organization: Patient refused    Attends meetings of clubs or organizations: Patient refused    Relationship status: Divorced  Other Topics Concern  . Not on file  Social History Narrative  . Not on file    Outpatient Encounter  Medications as of 01/19/2018  Medication Sig  . amLODipine (NORVASC) 10 MG tablet Take 1 tablet (10 mg total) by mouth daily.  Marland Kitchen aspirin EC 81 MG tablet Take 81 mg by mouth daily.  Marland Kitchen atorvastatin (LIPITOR) 20 MG tablet Take 1 tablet (20 mg total) by mouth daily.  . Calcium-Phosphorus-Vitamin D (CALCIUM/D3 ADULT GUMMIES PO) Take by mouth.  . hydrochlorothiazide (HYDRODIURIL) 25 MG tablet Take 1 tablet (25 mg total) by mouth daily.  Marland Kitchen latanoprost (XALATAN) 0.005 % ophthalmic solution 1 drop at bedtime.  . triamcinolone ointment (KENALOG) 0.1 % Apply 1 application topically 2 (two) times daily.   No facility-administered encounter medications on file as of 01/19/2018.     Activities of Daily Living In your present state of health, do you have any difficulty performing the following activities: 01/19/2018  Hearing? N  Comment denies hearing aids  Vision? N  Comment wears eyeglasses, glaucoma  Difficulty concentrating or making decisions? N  Walking or climbing stairs? N  Dressing or bathing? N  Doing errands, shopping? N  Preparing Food and eating ? N  Comment denies dentures  Using the Toilet? N  In the past six months, have you accidently leaked urine? N  Do you have problems with loss of bowel control? N  Managing your Medications? N  Managing your Finances? N  Housekeeping or managing your Housekeeping? N  Some recent data might be hidden    Patient Care Team: Glean Hess, MD as PCP - General (Family Medicine)    Assessment:   This is a routine wellness examination for [place].  Exercise Activities and Dietary recommendations Current Exercise Habits: The patient does not participate in regular exercise at present, Exercise limited by: None identified  Goals    . DIET - INCREASE WATER INTAKE     Recommend to drink at least 6-8 8oz glasses of water per day.       Fall Risk Fall Risk  01/19/2018 01/16/2017 01/16/2017 12/05/2015 01/13/2015  Falls in the past year? No No No  No No  Risk for fall due to : Impaired vision - - - -  Risk for fall due to: Comment wears eyeglasses, glaucoma - - - -   FALL RISK PREVENTION PERTAINING TO HOME: Is your home free of loose throw rugs in walkways, pet beds, electrical cords, etc? Yes Is there adequate lighting in your home to reduce risk of falls?  Yes Are there stairs in or around your home WITH handrails? Yes  ASSISTIVE DEVICES UTILIZED TO PREVENT FALLS: Use of a cane, walker or w/c? No Grab bars in the bathroom? Yes  Shower chair or a place to sit while bathing? Yes An elevated toilet seat or a handicapped toilet? Yes  Timed Get Up and Go Performed: Yes.  Pt ambulated 10 feet within 7 sec. Gait stead-fast and without the use of an assistive device. No intervention required at this time. Fall risk prevention has been discussed.  Community Resource Referral:  [place] Referral not required at this time.  Depression Screen PHQ 2/9 Scores 01/19/2018 01/16/2017 01/16/2017 12/05/2015  PHQ - 2 Score 0 0 0 0  PHQ- 9 Score 0 - - -     Cognitive Function     6CIT Screen 01/19/2018 01/16/2017  What Year? 0 points 0 points  What month? 0 points 0 points  What time? 0 points 0 points  Count back from 20 0 points 0 points  Months in reverse 0 points 0 points  Repeat phrase 2 points 0 points  Total Score 2 0    Immunization History  Administered Date(s) Administered  . Influenza, High Dose Seasonal PF 05/23/2017  . Influenza-Unspecified 05/11/2015, 05/23/2017  . Pneumococcal Conjugate-13 06/07/2015  . Pneumococcal Polysaccharide-23 12/01/2012  . Pneumococcal-Unspecified 12/21/2012  . Tdap 11/27/2009  . Zoster 01/11/2011    Qualifies for Shingles Vaccine? Yes. Zostavax completed 01/11/11. Due for Shingrix. Education has been provided regarding the importance of this vaccine. Pt has been advised to call her insurance company to determine her out of pocket expense. Advised she may also receive this vaccine at her  local pharmacy or Health Dept. Verbalized acceptance and understanding.  Screening Tests Health Maintenance  Topic Date Due  . FOOT EXAM  01/16/2018  . HEMOGLOBIN A1C  01/16/2018  . URINE MICROALBUMIN  01/16/2018  . INFLUENZA VACCINE  02/26/2018  . MAMMOGRAM  05/28/2018  . OPHTHALMOLOGY EXAM  11/29/2018  . COLONOSCOPY  02/18/2019  . TETANUS/TDAP  11/28/2019  . DEXA SCAN  Completed  . Hepatitis C Screening  Completed  . PNA vac Low Risk Adult  Completed    Cancer Screenings: Lung: Low Dose CT Chest recommended if Age 36-80 years, 30 pack-year currently smoking OR have quit w/in 15years. Patient does not qualify. Breast:  Up to date on Mammogram? Yes. Completed 05/28/17. Repeat every year. Ordered today. Message sent to referral coordinator for scheduling purposes AFTER 05/28/18.   Up to date of Bone Density/Dexa? No. Completed 04/15/16. Repeat every 2 years due to osteoporosis. Ordered today. Message sent to referral coordinator for scheduling purposes. Colorectal: Completed 02/17/09. Repeat every 10 years.  Additional Screenings: Hepatitis C Screening: Completed 12/05/15    Plan:  I have personally reviewed and addressed the Medicare Annual Wellness questionnaire and have noted the following in the patient's chart:  A. Medical and social history B. Use of alcohol, tobacco or illicit drugs  C. Current medications and supplements D. Functional ability and status E.  Nutritional status F.  Physical activity G. Advance directives H. List of other physicians I.  Hospitalizations, surgeries, and ER visits in previous 12 months J.  [place] such as hearing and vision if needed, cognitive and depression L. Referrals and appointments  In addition, I have reviewed and discussed with patient certain preventive protocols, quality metrics, and best practice recommendations. A written personalized care plan for preventive services as well as general preventive health  recommendations were provided to patient.  Signed,  Aleatha Borer, LPN Nurse Health Advisor  MD Recommendations: Zostavax completed 01/11/11. Due for Shingrix. Education has been provided regarding the importance of this vaccine. Pt has been advised to call her insurance company to determine her out of pocket expense. Advised she may also receive this vaccine at her local pharmacy or Health  Dept. Verbalized acceptance and understanding.  Diabetic Foot Exam: Completed 01/16/17. Pt has been advised about the importance in completing this exam. Advised to keep appt as scheduled with Dr. Army Melia. Verbalized acceptance and understanding.  Breast:  Up to date on Mammogram? Yes. Completed 05/28/17. Repeat every year. Ordered today. Message sent to referral coordinator for scheduling purposes AFTER 05/28/18.    Up to date of Bone Density/Dexa? No. Completed 04/15/16. Repeat every 2 years due to osteoporosis. Ordered today. Message sent to referral coordinator for scheduling purposes.

## 2018-01-19 NOTE — Patient Instructions (Signed)
Bethany Martinez , Thank you for taking time to come for your Medicare Wellness Visit. I appreciate your ongoing commitment to your health goals. Please review the following plan we discussed and let me know if I can assist you in the future.   Screening recommendations/referrals: Colorectal Screening: Up to date Mammogram: Up to date Bone Density: You will receive a call from our office  Vision and Dental Exams: Recommended annual ophthalmology exams for early detection of glaucoma and other disorders of the eye Recommended annual dental exams for proper oral hygiene  Diabetic Exams: Recommended annual diabetic eye exams for early detection of retinopathy Recommended annual diabetic foot exams for early detection of peripheral neuropathy.  Diabetic Eye Exam: Up to date Diabetic Foot Exam: Please keep your appointment as scheduled with Dr. Army Melia  Vaccinations: Influenza vaccine: Up to date Pneumococcal vaccine: Up to date Tdap vaccine: Up to date Shingles vaccine: Please call your insurance company to determine your out of pocket expense for the Shingrix vaccine. You may also receive this vaccine at your local pharmacy or Health Dept.   Advanced directives: Please bring a copy of your POA (Power of Attorney) and/or Living Will to your next appointment.  Goals: Recommend to drink at least 6-8 8oz glasses of water per day.  Next appointment: Please schedule your Annual Wellness Visit with your Nurse Health Advisor in one year.  Preventive Care 72 Years and Older, Female Preventive care refers to lifestyle choices and visits with your health care provider that can promote health and wellness. What does preventive care include?  A yearly physical exam. This is also called an annual well check.  Dental exams once or twice a year.  Routine eye exams. Ask your health care provider how often you should have your eyes checked.  Personal lifestyle choices, including:  Daily care of your  teeth and gums.  Regular physical activity.  Eating a healthy diet.  Avoiding tobacco and drug use.  Limiting alcohol use.  Practicing safe sex.  Taking low-dose aspirin every day.  Taking vitamin and mineral supplements as recommended by your health care provider. What happens during an annual well check? The services and screenings done by your health care provider during your annual well check will depend on your age, overall health, lifestyle risk factors, and family history of disease. Counseling  Your health care provider may ask you questions about your:  Alcohol use.  Tobacco use.  Drug use.  Emotional well-being.  Home and relationship well-being.  Sexual activity.  Eating habits.  History of falls.  Memory and ability to understand (cognition).  Work and work Statistician.  Reproductive health. Screening  You may have the following tests or measurements:  Height, weight, and BMI.  Blood pressure.  Lipid and cholesterol levels. These may be checked every 5 years, or more frequently if you are over 38 years old.  Skin check.  Lung cancer screening. You may have this screening every year starting at age 2 if you have a 30-pack-year history of smoking and currently smoke or have quit within the past 15 years.  Fecal occult blood test (FOBT) of the stool. You may have this test every year starting at age 88.  Flexible sigmoidoscopy or colonoscopy. You may have a sigmoidoscopy every 5 years or a colonoscopy every 10 years starting at age 54.  Hepatitis C blood test.  Hepatitis B blood test.  Sexually transmitted disease (STD) testing.  Diabetes screening. This is done by checking your blood  sugar (glucose) after you have not eaten for a while (fasting). You may have this done every 1-3 years.  Bone density scan. This is done to screen for osteoporosis. You may have this done starting at age 29.  Mammogram. This may be done every 1-2 years. Talk  to your health care provider about how often you should have regular mammograms. Talk with your health care provider about your test results, treatment options, and if necessary, the need for more tests. Vaccines  Your health care provider may recommend certain vaccines, such as:  Influenza vaccine. This is recommended every year.  Tetanus, diphtheria, and acellular pertussis (Tdap, Td) vaccine. You may need a Td booster every 10 years.  Zoster vaccine. You may need this after age 33.  Pneumococcal 13-valent conjugate (PCV13) vaccine. One dose is recommended after age 39.  Pneumococcal polysaccharide (PPSV23) vaccine. One dose is recommended after age 46. Talk to your health care provider about which screenings and vaccines you need and how often you need them. This information is not intended to replace advice given to you by your health care provider. Make sure you discuss any questions you have with your health care provider. Document Released: 08/11/2015 Document Revised: 04/03/2016 Document Reviewed: 05/16/2015 Elsevier Interactive Patient Education  2017 [place] Prevention in the Home Falls can cause injuries. They can happen to people of all ages. There are many things you can do to make your home safe and to help prevent falls. What can I do on the outside of my home?  Regularly fix the edges of walkways and driveways and fix any cracks.  Remove anything that might make you trip as you walk through a door, such as a raised step or threshold.  Trim any bushes or trees on the path to your home.  Use bright outdoor lighting.  Clear any walking paths of anything that might make someone trip, such as rocks or tools.  Regularly check to see if handrails are loose or broken. Make sure that both sides of any steps have handrails.  Any raised decks and porches should have guardrails on the edges.  Have any leaves, snow, or ice cleared regularly.  Use sand or salt on  walking paths during winter.  Clean up any spills in your garage right away. This includes oil or grease spills. What can I do in the bathroom?  Use night lights.  Install grab bars by the toilet and in the tub and shower. Do not use towel bars as grab bars.  Use non-skid mats or decals in the tub or shower.  If you need to sit down in the shower, use a plastic, non-slip stool.  Keep the floor dry. Clean up any water that spills on the floor as soon as it happens.  Remove soap buildup in the tub or shower regularly.  Attach bath mats securely with double-sided non-slip rug tape.  Do not have throw rugs and other things on the floor that can make you trip. What can I do in the bedroom?  Use night lights.  Make sure that you have a light by your bed that is easy to reach.  Do not use any sheets or blankets that are too big for your bed. They should not hang down onto the floor.  Have a firm chair that has side arms. You can use this for support while you get dressed.  Do not have throw rugs and other things on the floor that can  make you trip. What can I do in the kitchen?  Clean up any spills right away.  Avoid walking on wet floors.  Keep items that you use a lot in easy-to-reach places.  If you need to reach something above you, use a strong step stool that has a grab bar.  Keep electrical cords out of the way.  Do not use floor polish or wax that makes floors slippery. If you must use wax, use non-skid floor wax.  Do not have throw rugs and other things on the floor that can make you trip. What can I do with my stairs?  Do not leave any items on the stairs.  Make sure that there are handrails on both sides of the stairs and use them. Fix handrails that are broken or loose. Make sure that handrails are as long as the stairways.  Check any carpeting to make sure that it is firmly attached to the stairs. Fix any carpet that is loose or worn.  Avoid having throw  rugs at the top or bottom of the stairs. If you do have throw rugs, attach them to the floor with carpet tape.  Make sure that you have a light switch at the top of the stairs and the bottom of the stairs. If you do not have them, ask someone to add them for you. What else can I do to help prevent falls?  Wear shoes that:  Do not have high heels.  Have rubber bottoms.  Are comfortable and fit you well.  Are closed at the toe. Do not wear sandals.  If you use a stepladder:  Make sure that it is fully opened. Do not climb a closed stepladder.  Make sure that both sides of the stepladder are locked into place.  Ask someone to hold it for you, if possible.  Clearly mark and make sure that you can see:  Any grab bars or handrails.  First and last steps.  Where the edge of each step is.  Use tools that help you move around (mobility aids) if they are needed. These include:  Canes.  Walkers.  Scooters.  Crutches.  Turn on the lights when you go into a dark area. Replace any light bulbs as soon as they burn out.  Set up your furniture so you have a clear path. Avoid moving your furniture around.  If any of your floors are uneven, fix them.  If there are any pets around you, be aware of where they are.  Review your medicines with your doctor. Some medicines can make you feel dizzy. This can increase your chance of falling. Ask your doctor what other things that you can do to help prevent falls. This information is not intended to replace advice given to you by your health care provider. Make sure you discuss any questions you have with your health care provider. Document Released: 05/11/2009 Document Revised: 12/21/2015 Document Reviewed: 08/19/2014 Elsevier Interactive Patient Education  2017 [employer].

## 2018-02-04 ENCOUNTER — Ambulatory Visit: Payer: Medicare Other

## 2018-02-04 ENCOUNTER — Other Ambulatory Visit: Payer: Medicare Other

## 2018-02-11 ENCOUNTER — Other Ambulatory Visit: Payer: Self-pay | Admitting: Internal Medicine

## 2018-02-11 DIAGNOSIS — I10 Essential (primary) hypertension: Secondary | ICD-10-CM

## 2018-03-31 DIAGNOSIS — L239 Allergic contact dermatitis, unspecified cause: Secondary | ICD-10-CM | POA: Diagnosis not present

## 2018-04-17 ENCOUNTER — Ambulatory Visit (INDEPENDENT_AMBULATORY_CARE_PROVIDER_SITE_OTHER): Payer: Medicare Other | Admitting: Internal Medicine

## 2018-04-17 ENCOUNTER — Other Ambulatory Visit: Payer: Self-pay | Admitting: Internal Medicine

## 2018-04-17 ENCOUNTER — Encounter: Payer: Self-pay | Admitting: Internal Medicine

## 2018-04-17 VITALS — BP 104/62 | HR 72 | Ht 61.0 in | Wt 142.0 lb

## 2018-04-17 DIAGNOSIS — E782 Mixed hyperlipidemia: Secondary | ICD-10-CM | POA: Diagnosis not present

## 2018-04-17 DIAGNOSIS — Z23 Encounter for immunization: Secondary | ICD-10-CM | POA: Diagnosis not present

## 2018-04-17 DIAGNOSIS — Z1239 Encounter for other screening for malignant neoplasm of breast: Secondary | ICD-10-CM

## 2018-04-17 DIAGNOSIS — Z0000 Encounter for general adult medical examination without abnormal findings: Secondary | ICD-10-CM

## 2018-04-17 DIAGNOSIS — I10 Essential (primary) hypertension: Secondary | ICD-10-CM | POA: Diagnosis not present

## 2018-04-17 DIAGNOSIS — R600 Localized edema: Secondary | ICD-10-CM | POA: Diagnosis not present

## 2018-04-17 DIAGNOSIS — E1169 Type 2 diabetes mellitus with other specified complication: Secondary | ICD-10-CM | POA: Diagnosis not present

## 2018-04-17 DIAGNOSIS — Z1231 Encounter for screening mammogram for malignant neoplasm of breast: Secondary | ICD-10-CM | POA: Diagnosis not present

## 2018-04-17 DIAGNOSIS — E785 Hyperlipidemia, unspecified: Secondary | ICD-10-CM | POA: Diagnosis not present

## 2018-04-17 LAB — POCT URINALYSIS DIPSTICK
Bilirubin, UA: NEGATIVE
GLUCOSE UA: NEGATIVE
Ketones, UA: NEGATIVE
LEUKOCYTES UA: NEGATIVE
Nitrite, UA: NEGATIVE
PH UA: 7 (ref 5.0–8.0)
Protein, UA: NEGATIVE
RBC UA: NEGATIVE
Spec Grav, UA: <=1.005 — AB (ref 1.010–1.025)
UROBILINOGEN UA: 0.2 U/dL

## 2018-04-17 MED ORDER — AMLODIPINE BESYLATE 10 MG PO TABS: 10.0000 mg | ORAL_TABLET | ORAL | 3 refills | Status: DC

## 2018-04-17 MED ORDER — HYDROCHLOROTHIAZIDE 25 MG PO TABS: 25.0000 mg | ORAL_TABLET | Freq: Every day | ORAL | 3 refills | Status: DC

## 2018-04-17 MED ORDER — ATORVASTATIN CALCIUM 10 MG PO TABS: 10.0000 mg | ORAL_TABLET | ORAL | 1 refills | Status: DC

## 2018-04-17 NOTE — Progress Notes (Signed)
Date:  04/17/2018   Name:  Bethany Martinez   DOB:  04-Sep-1945   MRN:  [id number]   Chief Complaint: Hypertension; Diabetes; and Immunizations (High Dose Flu) Bethany Martinez is a 72 y.o. female who presents today for her Complete Annual Exam. She feels well. She reports exercising regularly walking at least 10K steps per day. She reports she is sleeping well.  Mammogram is scheduled for next month. DEXA is scheduled as well.  Diabetes  She presents for her follow-up diabetic visit. She has type 2 diabetes mellitus. Pertinent negatives for hypoglycemia include no dizziness, headaches, nervousness/anxiousness or tremors. Pertinent negatives for diabetes include no chest pain, no fatigue, no polydipsia and no polyuria. Current diabetic treatment includes diet. She participates in exercise daily. An ACE inhibitor/angiotensin II receptor blocker is not being taken. Eye exam is current.  Hyperlipidemia  This is a chronic problem. Pertinent negatives include no chest pain or shortness of breath. Compliance problems include medication side effects (muscular back pain from lipitor).   Hypertension  Pertinent negatives include no chest pain, headaches, palpitations or shortness of breath. Past treatments include calcium channel blockers and diuretics. The current treatment provides significant improvement.   Lab Results  Component Value Date   HGBA1C 6.1 (H) 07/18/2017   Lab Results  Component Value Date   CREATININE 0.88 01/16/2017   BUN 11 01/16/2017   NA 141 01/16/2017   K 3.4 (L) 01/16/2017   CL 96 01/16/2017   CO2 26 01/16/2017   Lab Results  Component Value Date   CHOL 208 (H) 01/16/2017   HDL 60 01/16/2017   LDLCALC 122 (H) 01/16/2017   TRIG 132 01/16/2017   CHOLHDL 3.5 01/16/2017     Review of Systems  Constitutional: Negative for chills, fatigue and fever.  HENT: Negative for congestion, hearing loss, tinnitus, trouble swallowing and voice change.   Eyes:  Positive for visual disturbance.  Respiratory: Negative for cough, chest tightness, shortness of breath and wheezing.   Cardiovascular: Negative for chest pain, palpitations and leg swelling.  Gastrointestinal: Negative for abdominal pain, constipation, diarrhea and vomiting.  Endocrine: Negative for polydipsia and polyuria.  Genitourinary: Negative for dysuria, frequency, genital sores, vaginal bleeding and vaginal discharge.  Musculoskeletal: Negative for arthralgias, gait problem and joint swelling.  Skin: Negative for color change and rash.  Allergic/Immunologic: Positive for environmental allergies.  Neurological: Negative for dizziness, tremors, light-headedness and headaches.  Hematological: Negative for adenopathy. Does not bruise/bleed easily.  Psychiatric/Behavioral: Negative for dysphoric mood and sleep disturbance. The patient is not nervous/anxious.     Patient Active Problem List   Diagnosis Date Noted  . Eczema 07/18/2017  . DM type 2 with diabetic mixed hyperlipidemia ([place]) 12/05/2015  . Edema extremities 01/11/2015  . Allergic state 01/11/2015  . Essential (primary) hypertension 01/11/2015    No Known Allergies  Past Surgical History:  Procedure Laterality Date  . ABDOMINAL HYSTERECTOMY  1988   partial  . COLONOSCOPY  01/2009   Dr. Jamal Collin    Social History   Tobacco Use  . Smoking status: Never Smoker  . Smokeless tobacco: Never Used  . Tobacco comment: smoking cessation materials not required  Substance Use Topics  . Alcohol use: No    Alcohol/week: 0.0 standard drinks  . Drug use: No     Medication list has been reviewed and updated.  Current Meds  Medication Sig  . amLODipine (NORVASC) 10 MG tablet TAKE 1 TABLET BY MOUTH EVERY DAY  . aspirin  EC 81 MG tablet Take 81 mg by mouth daily.  Marland Kitchen atorvastatin (LIPITOR) 20 MG tablet Take 1 tablet (20 mg total) by mouth daily.  . Calcium-Phosphorus-Vitamin D (CALCIUM/D3 ADULT GUMMIES PO) Take by mouth.  .  hydrochlorothiazide (HYDRODIURIL) 25 MG tablet TAKE 1 TABLET BY MOUTH EVERY DAY  . latanoprost (XALATAN) 0.005 % ophthalmic solution 1 drop at bedtime.  . triamcinolone ointment (KENALOG) 0.1 % Apply 1 application topically 2 (two) times daily.    PHQ 2/9 Scores 04/17/2018 01/19/2018 01/16/2017 01/16/2017  PHQ - 2 Score 0 0 0 0  PHQ- 9 Score - 0 - -    Physical Exam  Constitutional: She is oriented to person, place, and time. She appears well-developed and well-nourished. No distress.  HENT:  Head: Normocephalic and atraumatic.  Right Ear: Tympanic membrane and ear canal normal.  Left Ear: Tympanic membrane and ear canal normal.  Nose: Right sinus exhibits no maxillary sinus tenderness. Left sinus exhibits no maxillary sinus tenderness.  Mouth/Throat: Uvula is midline and oropharynx is clear and moist.  Eyes: Conjunctivae and EOM are normal. Right eye exhibits no discharge. Left eye exhibits no discharge. No scleral icterus.  Neck: Normal range of motion. Carotid bruit is not present. No erythema present. No thyromegaly present.  Cardiovascular: Normal rate, regular rhythm, normal heart sounds and normal pulses.  Pulmonary/Chest: Effort normal. No respiratory distress. She has no wheezes. Right breast exhibits no mass, no nipple discharge, no skin change and no tenderness. Left breast exhibits no mass, no nipple discharge, no skin change and no tenderness.  Abdominal: Soft. Bowel sounds are normal. There is no hepatosplenomegaly. There is no tenderness. There is no CVA tenderness.  Musculoskeletal: Normal range of motion.  Lymphadenopathy:    She has no cervical adenopathy.    She has no axillary adenopathy.  Neurological: She is alert and oriented to person, place, and time. She has normal strength and normal reflexes. No cranial nerve deficit or sensory deficit. She displays a negative Romberg sign.  Skin: Skin is warm, dry and intact. No rash noted.  Psychiatric: She has a normal mood and  affect. Her speech is normal and behavior is normal. Judgment and thought content normal. Cognition and memory are normal.  Nursing note and vitals reviewed.   BP 104/62 (BP Location: Right Arm, Patient Position: Sitting, Cuff Size: Normal)   Pulse 72   Ht 5\' 1"  (1.549 m)   Wt 142 lb (64.4 kg)   SpO2 98%   BMI 26.83 kg/m   Assessment and Plan: 1. Annual physical exam Continue healthy diet and exercise Vaccinations up to date She will inquire at pharmacy about Shingrix DEXA scheduled - POCT urinalysis dipstick  2. Breast cancer screening Mammogram scheduled  3. Essential (primary) hypertension Controlled on CCB and diuretic - CBC with Differential/Platelet - TSH - amLODipine (NORVASC) 10 MG tablet; Take 1 tablet (10 mg total) by mouth 3 (three) times a week.  Dispense: 90 tablet; Refill: 3 - hydrochlorothiazide (HYDRODIURIL) 25 MG tablet; Take 1 tablet (25 mg total) by mouth daily.  Dispense: 90 tablet; Refill: 3  4. DM type 2 with diabetic mixed hyperlipidemia ([place]) Doing well on diet - check urine microalbumin Schedule DM eye exam - Comprehensive metabolic panel - Hemoglobin A1c - Lipid panel - Microalbumin / creatinine urine ratio  5. Edema extremities controlled - TSH  6. Dyslipidemia Try lower dose tiw - atorvastatin (LIPITOR) 10 MG tablet; Take 1 tablet (10 mg total) by mouth 3 (three) times a  week.  Dispense: 90 tablet; Refill: 1  7. Need for influenza vaccination - Flu vaccine HIGH DOSE PF   Partially dictated using Editor, commissioning. Any errors are unintentional.  Halina Maidens, MD [facility]  04/17/2018

## 2018-04-18 LAB — COMPREHENSIVE METABOLIC PANEL
A/G RATIO: 1.5 (ref 1.2–2.2)
ALT: 22 IU/L (ref 0–32)
AST: 18 IU/L (ref 0–40)
Albumin: 5 g/dL — ABNORMAL HIGH (ref 3.5–4.8)
Alkaline Phosphatase: 89 IU/L (ref 39–117)
BUN/Creatinine Ratio: 14 (ref 12–28)
BUN: 11 mg/dL (ref 8–27)
Bilirubin Total: 1 mg/dL (ref 0.0–1.2)
CALCIUM: 10 mg/dL (ref 8.7–10.3)
CHLORIDE: 94 mmol/L — AB (ref 96–106)
CO2: 23 mmol/L (ref 20–29)
Creatinine, Ser: 0.8 mg/dL (ref 0.57–1.00)
GFR calc Af Amer: 86 mL/min/{1.73_m2} (ref >59)
GFR, EST NON AFRICAN AMERICAN: 74 mL/min/{1.73_m2} (ref >59)
Globulin, Total: 3.3 g/dL (ref 1.5–4.5)
Glucose: 92 mg/dL (ref 65–99)
POTASSIUM: 3.4 mmol/L — AB (ref 3.5–5.2)
Sodium: 140 mmol/L (ref 134–144)
Total Protein: 8.3 g/dL (ref 6.0–8.5)

## 2018-04-18 LAB — CBC WITH DIFFERENTIAL/PLATELET
BASOS ABS: 0 10*3/uL (ref 0.0–0.2)
BASOS: 0 %
EOS (ABSOLUTE): 0 10*3/uL (ref 0.0–0.4)
Eos: 0 %
Hematocrit: 41.8 % (ref 34.0–46.6)
Hemoglobin: 13.8 g/dL (ref 11.1–15.9)
IMMATURE GRANULOCYTES: 1 %
Immature Grans (Abs): 0 10*3/uL (ref 0.0–0.1)
Lymphocytes Absolute: 1.9 10*3/uL (ref 0.7–3.1)
Lymphs: 29 %
MCH: 27.3 pg (ref 26.6–33.0)
MCHC: 33 g/dL (ref 31.5–35.7)
MCV: 83 fL (ref 79–97)
MONOS ABS: 0.5 10*3/uL (ref 0.1–0.9)
Monocytes: 8 %
NEUTROS PCT: 62 %
Neutrophils Absolute: 4 10*3/uL (ref 1.4–7.0)
PLATELETS: 333 10*3/uL (ref 150–450)
RBC: 5.05 x10E6/uL (ref 3.77–5.28)
RDW: 14.5 % (ref 12.3–15.4)
WBC: 6.4 10*3/uL (ref 3.4–10.8)

## 2018-04-18 LAB — LIPID PANEL
CHOLESTEROL TOTAL: 281 mg/dL — AB (ref 100–199)
Chol/HDL Ratio: 4.5 ratio — ABNORMAL HIGH (ref 0.0–4.4)
HDL: 63 mg/dL (ref >39)
LDL Calculated: 191 mg/dL — ABNORMAL HIGH (ref 0–99)
Triglycerides: 133 mg/dL (ref 0–149)
VLDL CHOLESTEROL CAL: 27 mg/dL (ref 5–40)

## 2018-04-18 LAB — TSH: TSH: 1.68 u[IU]/mL (ref 0.450–4.500)

## 2018-04-18 LAB — HEMOGLOBIN A1C
ESTIMATED AVERAGE GLUCOSE: 126 mg/dL
Hgb A1c MFr Bld: 6 % — ABNORMAL HIGH (ref 4.8–5.6)

## 2018-04-21 LAB — MICROALBUMIN / CREATININE URINE RATIO
Creatinine, Urine: 118.6 mg/dL
MICROALB/CREAT RATIO: 5.6 mg/g{creat} (ref 0.0–30.0)
MICROALBUM., U, RANDOM: 6.7 ug/mL

## 2018-05-18 DIAGNOSIS — Z0120 Encounter for dental examination and cleaning without abnormal findings: Secondary | ICD-10-CM | POA: Insufficient documentation

## 2018-05-18 HISTORY — DX: Encounter for dental examination and cleaning without abnormal findings: Z01.20

## 2018-05-19 DIAGNOSIS — H40003 Preglaucoma, unspecified, bilateral: Secondary | ICD-10-CM | POA: Diagnosis not present

## 2018-05-29 DIAGNOSIS — H40003 Preglaucoma, unspecified, bilateral: Secondary | ICD-10-CM | POA: Diagnosis not present

## 2018-06-01 ENCOUNTER — Ambulatory Visit
Admission: RE | Admit: 2018-06-01 | Discharge: 2018-06-01 | Disposition: A | Discharge status: Home / Self Care | Payer: Medicare Other | Source: Ambulatory Visit | Attending: Internal Medicine | Admitting: Internal Medicine

## 2018-06-01 DIAGNOSIS — Z1231 Encounter for screening mammogram for malignant neoplasm of breast: Secondary | ICD-10-CM | POA: Diagnosis not present

## 2018-06-01 DIAGNOSIS — Z780 Asymptomatic menopausal state: Secondary | ICD-10-CM | POA: Diagnosis not present

## 2018-06-01 DIAGNOSIS — Z1239 Encounter for other screening for malignant neoplasm of breast: Secondary | ICD-10-CM

## 2018-06-01 DIAGNOSIS — M810 Age-related osteoporosis without current pathological fracture: Secondary | ICD-10-CM | POA: Diagnosis not present

## 2018-06-01 DIAGNOSIS — Z13820 Encounter for screening for osteoporosis: Secondary | ICD-10-CM | POA: Diagnosis not present

## 2018-06-01 DIAGNOSIS — E2839 Other primary ovarian failure: Secondary | ICD-10-CM

## 2018-06-01 DIAGNOSIS — M85832 Other specified disorders of bone density and structure, left forearm: Secondary | ICD-10-CM | POA: Insufficient documentation

## 2018-06-01 DIAGNOSIS — M85852 Other specified disorders of bone density and structure, left thigh: Secondary | ICD-10-CM | POA: Diagnosis not present

## 2018-06-01 DIAGNOSIS — M8589 Other specified disorders of bone density and structure, multiple sites: Secondary | ICD-10-CM | POA: Diagnosis not present

## 2018-06-03 ENCOUNTER — Encounter: Payer: Self-pay | Admitting: Internal Medicine

## 2018-06-03 ENCOUNTER — Ambulatory Visit: Payer: Medicare Other | Admitting: Internal Medicine

## 2018-06-03 VITALS — BP 124/66 | HR 83 | Ht 61.0 in | Wt 144.0 lb

## 2018-06-03 DIAGNOSIS — L309 Dermatitis, unspecified: Secondary | ICD-10-CM

## 2018-06-03 DIAGNOSIS — B029 Zoster without complications: Secondary | ICD-10-CM

## 2018-06-03 MED ORDER — TRIAMCINOLONE ACETONIDE 0.1 % EX OINT: 1.0000 "application " | TOPICAL_OINTMENT | Freq: Two times a day (BID) | CUTANEOUS | 0 refills | Status: DC

## 2018-06-03 MED ORDER — VALACYCLOVIR HCL 1 G PO TABS: 1000.0000 mg | ORAL_TABLET | Freq: Three times a day (TID) | ORAL | 0 refills | Status: DC

## 2018-06-03 NOTE — Progress Notes (Signed)
Date:  06/03/2018   Name:  Bethany Martinez   DOB:  [birth year]   MRN:  [id number]   Chief Complaint: Cyst (Knot on the back of the neck. Noticed it over the weekend. Slightly sore. Does not move. Unsure where knot came from. )  Rash  This is a new problem. The current episode started in the past 7 days. The problem has been gradually improving since onset. The affected locations include the neck. The rash is characterized by blistering, burning, peeling and itchiness. Associated symptoms include congestion and fatigue. Pertinent negatives include no cough, diarrhea, fever, shortness of breath or vomiting. Past treatments include nothing.    Review of Systems  Constitutional: Positive for fatigue. Negative for chills, fever and unexpected weight change.  HENT: Positive for congestion.   Respiratory: Negative for cough, chest tightness and shortness of breath.   Cardiovascular: Negative for chest pain and palpitations.  Gastrointestinal: Negative for diarrhea and vomiting.  Skin: Positive for rash.  Allergic/Immunologic: Negative for environmental allergies.  Neurological: Negative for dizziness and headaches.    Patient Active Problem List   Diagnosis Date Noted  . Eczema 07/18/2017  . DM type 2 with diabetic mixed hyperlipidemia ([place]) 12/05/2015  . Edema extremities 01/11/2015  . Allergic state 01/11/2015  . Essential (primary) hypertension 01/11/2015    No Known Allergies  Past Surgical History:  Procedure Laterality Date  . ABDOMINAL HYSTERECTOMY  1988   partial  . COLONOSCOPY  01/2009   Dr. Jamal Collin    Social History   Tobacco Use  . Smoking status: Never Smoker  . Smokeless tobacco: Never Used  . Tobacco comment: smoking cessation materials not required  Substance Use Topics  . Alcohol use: No    Alcohol/week: 0.0 standard drinks  . Drug use: No     Medication list has been reviewed and updated.  Current Meds  Medication Sig  . amLODipine (NORVASC)  10 MG tablet Take 1 tablet (10 mg total) by mouth 3 (three) times a week.  Marland Kitchen aspirin EC 81 MG tablet Take 81 mg by mouth daily.  Marland Kitchen atorvastatin (LIPITOR) 10 MG tablet Take 1 tablet (10 mg total) by mouth 3 (three) times a week.  . Calcium-Phosphorus-Vitamin D (CALCIUM/D3 ADULT GUMMIES PO) Take by mouth.  . hydrochlorothiazide (HYDRODIURIL) 25 MG tablet Take 1 tablet (25 mg total) by mouth daily.  Marland Kitchen latanoprost (XALATAN) 0.005 % ophthalmic solution 1 drop at bedtime.  . triamcinolone ointment (KENALOG) 0.1 % Apply 1 application topically 2 (two) times daily.    PHQ 2/9 Scores 04/17/2018 01/19/2018 01/16/2017 01/16/2017  PHQ - 2 Score 0 0 0 0  PHQ- 9 Score - 0 - -    Physical Exam  Constitutional: She is oriented to person, place, and time. She appears well-developed. No distress.  HENT:  Head: Normocephalic and atraumatic.  Cardiovascular: Normal rate, regular rhythm and normal heart sounds.  Pulmonary/Chest: Effort normal and breath sounds normal. No respiratory distress.  Musculoskeletal: Normal range of motion.  Neurological: She is alert and oriented to person, place, and time.  Skin: Skin is warm and dry. No rash noted.     Psychiatric: She has a normal mood and affect. Her behavior is normal. Thought content normal.  Nursing note and vitals reviewed.   BP 124/66 (BP Location: Right Arm, Patient Position: Sitting, Cuff Size: Normal)   Pulse 83   Ht 5\' 1"  (1.549 m)   Wt 144 lb (65.3 kg)   SpO2 100%  BMI 27.21 kg/m   Assessment and Plan: 1. Eczema, unspecified type - triamcinolone ointment (KENALOG) 0.1 %; Apply 1 application topically 2 (two) times daily.  Dispense: 80 g; Refill: 0  2. Herpes zoster without complication Suspect mild case of zoster - will treat with Valtrex Shingrix vaccine next visit - valACYclovir (VALTREX) 1000 MG tablet; Take 1 tablet (1,000 mg total) by mouth 3 (three) times daily.  Dispense: 21 tablet; Refill: 0   Partially dictated using Radio producer. Any errors are unintentional.  Halina Maidens, MD [facility]  06/03/2018

## 2018-06-10 ENCOUNTER — Other Ambulatory Visit: Payer: Self-pay | Admitting: Internal Medicine

## 2018-06-10 DIAGNOSIS — E785 Hyperlipidemia, unspecified: Secondary | ICD-10-CM

## 2018-10-16 ENCOUNTER — Ambulatory Visit: Payer: Medicare Other | Admitting: Internal Medicine

## 2019-02-01 ENCOUNTER — Other Ambulatory Visit: Payer: Self-pay | Admitting: Internal Medicine

## 2019-02-01 DIAGNOSIS — I10 Essential (primary) hypertension: Secondary | ICD-10-CM

## 2019-04-13 ENCOUNTER — Other Ambulatory Visit: Payer: Self-pay | Admitting: Internal Medicine

## 2019-04-13 ENCOUNTER — Ambulatory Visit: Payer: Medicare Other | Admitting: Internal Medicine

## 2019-04-13 DIAGNOSIS — Z1231 Encounter for screening mammogram for malignant neoplasm of breast: Secondary | ICD-10-CM

## 2019-04-14 ENCOUNTER — Telehealth: Payer: Self-pay | Admitting: Internal Medicine

## 2019-04-14 NOTE — Telephone Encounter (Signed)
°  Called to schedule Medicare Annual Wellness Visit with Nurse Health Advisor, [place]. If patient returns call, please schedule AWV with [facility] Questions regarding scheduling, please call  [phone] or Skype > [email or web address]   [place]  ??[email or web address]   ??[id number]   1Skype

## 2019-04-29 DIAGNOSIS — H401131 Primary open-angle glaucoma, bilateral, mild stage: Secondary | ICD-10-CM | POA: Diagnosis not present

## 2019-04-29 LAB — HM DIABETES EYE EXAM

## 2019-05-10 ENCOUNTER — Ambulatory Visit (INDEPENDENT_AMBULATORY_CARE_PROVIDER_SITE_OTHER): Payer: Medicare Other

## 2019-05-10 ENCOUNTER — Other Ambulatory Visit: Payer: Self-pay

## 2019-05-10 VITALS — BP 142/86 | HR 99 | Temp 98.4°F | Resp 16 | Ht 61.0 in | Wt 153.8 lb

## 2019-05-10 DIAGNOSIS — Z23 Encounter for immunization: Secondary | ICD-10-CM | POA: Diagnosis not present

## 2019-05-10 DIAGNOSIS — Z0000 Encounter for general adult medical examination without abnormal findings: Secondary | ICD-10-CM | POA: Diagnosis not present

## 2019-05-10 NOTE — Patient Instructions (Signed)
[name] , Thank you for taking time to come for your Medicare Wellness Visit. I appreciate your ongoing commitment to your health goals. Please review the following plan we discussed and let me know if I can assist you in the future.   Screening recommendations/referrals: Colonoscopy: done 02/17/09. Pt requests to postpone. Mammogram: done 06/01/18. Scheduled for 06/03/19 Bone Density: done 06/01/18 Recommended yearly ophthalmology/optometry visit for glaucoma screening and checkup Recommended yearly dental visit for hygiene and checkup  Vaccinations: Influenza vaccine: done today Pneumococcal vaccine: done 06/07/15 Tdap vaccine: done 11/27/09 Shingles vaccine: Shingrix discussed. Please contact your pharmacy for coverage information.   Advanced directives: Please bring a copy of your health care power of attorney and living will to the office at your convenience once you have completed those documents.   Conditions/risks identified: Keep up the great work!  Next appointment: Please follow up in one year for your Medicare Annual Wellness visit.     Preventive Care 71 Years and Older, Female Preventive care refers to lifestyle choices and visits with your health care provider that can promote health and wellness. What does preventive care include?  A yearly physical exam. This is also called an annual well check.  Dental exams once or twice a year.  Routine eye exams. Ask your health care provider how often you should have your eyes checked.  Personal lifestyle choices, including:  Daily care of your teeth and gums.  Regular physical activity.  Eating a healthy diet.  Avoiding tobacco and drug use.  Limiting alcohol use.  Practicing safe sex.  Taking low-dose aspirin every day.  Taking vitamin and mineral supplements as recommended by your health care provider. What happens during an annual well check? The services and screenings done by your health care provider during  your annual well check will depend on your age, overall health, lifestyle risk factors, and family history of disease. Counseling  Your health care provider may ask you questions about your:  Alcohol use.  Tobacco use.  Drug use.  Emotional well-being.  Home and relationship well-being.  Sexual activity.  Eating habits.  History of falls.  Memory and ability to understand (cognition).  Work and work Statistician.  Reproductive health. Screening  You may have the following tests or measurements:  Height, weight, and BMI.  Blood pressure.  Lipid and cholesterol levels. These may be checked every 5 years, or more frequently if you are over 50 years old.  Skin check.  Lung cancer screening. You may have this screening every year starting at age 55 if you have a 30-pack-year history of smoking and currently smoke or have quit within the past 15 years.  Fecal occult blood test (FOBT) of the stool. You may have this test every year starting at age 4.  Flexible sigmoidoscopy or colonoscopy. You may have a sigmoidoscopy every 5 years or a colonoscopy every 10 years starting at age 8.  Hepatitis C blood test.  Hepatitis B blood test.  Sexually transmitted disease (STD) testing.  Diabetes screening. This is done by checking your blood sugar (glucose) after you have not eaten for a while (fasting). You may have this done every 1-3 years.  Bone density scan. This is done to screen for osteoporosis. You may have this done starting at age 25.  Mammogram. This may be done every 1-2 years. Talk to your health care provider about how often you should have regular mammograms. Talk with your health care provider about your test results, treatment options,  and if necessary, the need for more tests. Vaccines  Your health care provider may recommend certain vaccines, such as:  Influenza vaccine. This is recommended every year.  Tetanus, diphtheria, and acellular pertussis (Tdap,  Td) vaccine. You may need a Td booster every 10 years.  Zoster vaccine. You may need this after age 38.  Pneumococcal 13-valent conjugate (PCV13) vaccine. One dose is recommended after age 49.  Pneumococcal polysaccharide (PPSV23) vaccine. One dose is recommended after age 36. Talk to your health care provider about which screenings and vaccines you need and how often you need them. This information is not intended to replace advice given to you by your health care provider. Make sure you discuss any questions you have with your health care provider. Document Released: 08/11/2015 Document Revised: 04/03/2016 Document Reviewed: 05/16/2015 Elsevier Interactive Patient Education  2017 [employer] Prevention in the Home Falls can cause injuries. They can happen to people of all ages. There are many things you can do to make your home safe and to help prevent falls. What can I do on the outside of my home?  Regularly fix the edges of walkways and driveways and fix any cracks.  Remove anything that might make you trip as you walk through a door, such as a raised step or threshold.  Trim any bushes or trees on the path to your home.  Use bright outdoor lighting.  Clear any walking paths of anything that might make someone trip, such as rocks or tools.  Regularly check to see if handrails are loose or broken. Make sure that both sides of any steps have handrails.  Any raised decks and porches should have guardrails on the edges.  Have any leaves, snow, or ice cleared regularly.  Use sand or salt on walking paths during winter.  Clean up any spills in your garage right away. This includes oil or grease spills. What can I do in the bathroom?  Use night lights.  Install grab bars by the toilet and in the tub and shower. Do not use towel bars as grab bars.  Use non-skid mats or decals in the tub or shower.  If you need to sit down in the shower, use a plastic, non-slip stool.   Keep the floor dry. Clean up any water that spills on the floor as soon as it happens.  Remove soap buildup in the tub or shower regularly.  Attach bath mats securely with double-sided non-slip rug tape.  Do not have throw rugs and other things on the floor that can make you trip. What can I do in the bedroom?  Use night lights.  Make sure that you have a light by your bed that is easy to reach.  Do not use any sheets or blankets that are too big for your bed. They should not hang down onto the floor.  Have a firm chair that has side arms. You can use this for support while you get dressed.  Do not have throw rugs and other things on the floor that can make you trip. What can I do in the kitchen?  Clean up any spills right away.  Avoid walking on wet floors.  Keep items that you use a lot in easy-to-reach places.  If you need to reach something above you, use a strong step stool that has a grab bar.  Keep electrical cords out of the way.  Do not use floor polish or wax that makes floors slippery. If  you must use wax, use non-skid floor wax.  Do not have throw rugs and other things on the floor that can make you trip. What can I do with my stairs?  Do not leave any items on the stairs.  Make sure that there are handrails on both sides of the stairs and use them. Fix handrails that are broken or loose. Make sure that handrails are as long as the stairways.  Check any carpeting to make sure that it is firmly attached to the stairs. Fix any carpet that is loose or worn.  Avoid having throw rugs at the top or bottom of the stairs. If you do have throw rugs, attach them to the floor with carpet tape.  Make sure that you have a light switch at the top of the stairs and the bottom of the stairs. If you do not have them, ask someone to add them for you. What else can I do to help prevent falls?  Wear shoes that:  Do not have high heels.  Have rubber bottoms.  Are  comfortable and fit you well.  Are closed at the toe. Do not wear sandals.  If you use a stepladder:  Make sure that it is fully opened. Do not climb a closed stepladder.  Make sure that both sides of the stepladder are locked into place.  Ask someone to hold it for you, if possible.  Clearly mark and make sure that you can see:  Any grab bars or handrails.  First and last steps.  Where the edge of each step is.  Use tools that help you move around (mobility aids) if they are needed. These include:  Canes.  Walkers.  Scooters.  Crutches.  Turn on the lights when you go into a dark area. Replace any light bulbs as soon as they burn out.  Set up your furniture so you have a clear path. Avoid moving your furniture around.  If any of your floors are uneven, fix them.  If there are any pets around you, be aware of where they are.  Review your medicines with your doctor. Some medicines can make you feel dizzy. This can increase your chance of falling. Ask your doctor what other things that you can do to help prevent falls. This information is not intended to replace advice given to you by your health care provider. Make sure you discuss any questions you have with your health care provider. Document Released: 05/11/2009 Document Revised: 12/21/2015 Document Reviewed: 08/19/2014 Elsevier Interactive Patient Education  2017 [employer].

## 2019-05-10 NOTE — Progress Notes (Signed)
Subjective:   Bethany Martinez is a 73 y.o. female who presents for Medicare Annual (Subsequent) preventive examination.  Review of Systems:   Cardiac Risk Factors include: advanced age (>54men, >55 women);diabetes mellitus;dyslipidemia;hypertension     Objective:     Vitals: BP (!) 142/86 (BP Location: Left Arm, Patient Position: Sitting, Cuff Size: Normal)   Pulse 99   Temp 98.4 F (36.9 C) (Oral)   Resp 16   Ht 5\' 1"  (1.549 m)   Wt 153 lb 12.8 oz (69.8 kg)   SpO2 99%   BMI 29.06 kg/m   Body mass index is 29.06 kg/m.  Advanced Directives 05/10/2019 01/19/2018 12/05/2015  Does Patient Have a Medical Advance Directive? No Yes Yes  Type of Advance Directive - [place];Living will -  Copy of [place] in Chart? - No - copy requested No - copy requested  Would patient like information on creating a medical advance directive? No - Patient declined - -    Tobacco Social History   Tobacco Use  Smoking Status Never Smoker  Smokeless Tobacco Never Used  Tobacco Comment   smoking cessation materials not required     Counseling given: Not Answered Comment: smoking cessation materials not required   Clinical Intake:  Pre-visit preparation completed: Yes  Pain : No/denies pain     BMI - recorded: 29.06 Nutritional Status: BMI 25 -29 Overweight Nutritional Risks: None Diabetes: Yes CBG done?: No Did pt. bring in CBG monitor from home?: No   Nutrition Risk Assessment:  Has the patient had any N/V/D within the last 2 months?  No  Does the patient have any non-healing wounds?  No  Has the patient had any unintentional weight loss or weight gain?  No   Diabetes:  Is the patient diabetic?  Yes  If diabetic, was a CBG obtained today?  No  Did the patient bring in their glucometer from home?  No  How often do you monitor your CBG's? Pt does not check her blood sugar.   Financial Strains and Diabetes Management:  Are you  having any financial strains with the device, your supplies or your medication? No .  Does the patient want to be seen by Chronic Care Management for management of their diabetes?  No  Would the patient like to be referred to a Nutritionist or for Diabetic Management?  No   Diabetic Exams:  Diabetic Eye Exam: Completed 04/29/19 negative retinopathy, pt to request report when picking up glasses today.   Diabetic Foot Exam: Completed 04/17/18. Pt has been advised about the importance in completing this exam. Pt is scheduled for diabetic foot exam on 05/18/19.   How often do you need to have someone help you when you read instructions, pamphlets, or other written materials from your doctor or pharmacy?: 1 - Never  Interpreter Needed?: No  Information entered by :: Clemetine Marker LPN  Past Medical History:  Diagnosis Date  . Allergy   . Diabetes mellitus without complication ([place])   . Glaucoma   . Hyperlipidemia   . Hypertension    Past Surgical History:  Procedure Laterality Date  . ABDOMINAL HYSTERECTOMY  1988   partial  . COLONOSCOPY  01/2009   Dr. Jamal Collin   Family History  Problem Relation Age of Onset  . Breast cancer Sister 33  . Cancer Sister        colon  . Dementia Sister   . Breast cancer Paternal Aunt 29  .  Hypertension Mother   . Hypertension Father   . Heart disease Brother        CABG   Social History   Socioeconomic History  . Marital status: Divorced    Spouse name: Not on file  . Number of children: 2  . Years of education: some college  . Highest education level: 12th grade  Occupational History  . Occupation: Retired  Scientific laboratory technician  . Financial resource strain: Not hard at all  . Food insecurity    Worry: Never true    Inability: Never true  . Transportation needs    Medical: No    Non-medical: No  Tobacco Use  . Smoking status: Never Smoker  . Smokeless tobacco: Never Used  . Tobacco comment: smoking cessation materials not required  Substance  and Sexual Activity  . Alcohol use: No    Alcohol/week: 0.0 standard drinks  . Drug use: No  . Sexual activity: Not Currently  Lifestyle  . Physical activity    Days per week: 4 days    Minutes per session: 30 min  . Stress: Only a little  Relationships  . Social Herbalist on phone: Patient refused    Gets together: Patient refused    Attends religious service: Patient refused    Active member of club or organization: Patient refused    Attends meetings of clubs or organizations: Patient refused    Relationship status: Divorced  Other Topics Concern  . Not on file  Social History Narrative   Pt lives with her sister who has dementia and is her primary caregiver    Outpatient Encounter Medications as of 05/10/2019  Medication Sig  . amLODipine (NORVASC) 10 MG tablet TAKE 1 TABLET BY MOUTH EVERY DAY  . aspirin EC 81 MG tablet Take 81 mg by mouth daily. Patient taking 3 times per week  . atorvastatin (LIPITOR) 10 MG tablet TAKE 1 TABLET (10 MG TOTAL) BY MOUTH 3 (THREE) TIMES A WEEK.  Marland Kitchen azelastine (OPTIVAR) 0.05 % ophthalmic solution Place 1 drop into both eyes 2 (two) times daily.  . Calcium-Phosphorus-Vitamin D (CALCIUM/D3 ADULT GUMMIES PO) Take by mouth.  . hydrochlorothiazide (HYDRODIURIL) 25 MG tablet Take 1 tablet (25 mg total) by mouth daily.  Marland Kitchen latanoprost (XALATAN) 0.005 % ophthalmic solution 1 drop at bedtime.  . triamcinolone ointment (KENALOG) 0.1 % Apply 1 application topically 2 (two) times daily.  . TURMERIC CURCUMIN PO Take by mouth.  . [DISCONTINUED] valACYclovir (VALTREX) 1000 MG tablet Take 1 tablet (1,000 mg total) by mouth 3 (three) times daily.   No facility-administered encounter medications on file as of 05/10/2019.     Activities of Daily Living In your present state of health, do you have any difficulty performing the following activities: 05/10/2019  Hearing? N  Comment declines hearing aids  Vision? N  Difficulty concentrating or making  decisions? N  Walking or climbing stairs? N  Dressing or bathing? N  Doing errands, shopping? N  Preparing Food and eating ? N  Using the Toilet? N  In the past six months, have you accidently leaked urine? N  Do you have problems with loss of bowel control? N  Managing your Medications? N  Managing your Finances? N  Housekeeping or managing your Housekeeping? N  Some recent data might be hidden    Patient Care Team: Glean Hess, MD as PCP - General (Internal Medicine) Eulogio Bear, MD as Consulting Physician (Ophthalmology)    Assessment:  This is a routine wellness examination for [place].  Exercise Activities and Dietary recommendations Current Exercise Habits: Home exercise routine, Type of exercise: walking, Time (Minutes): 40, Frequency (Times/Week): 4, Weekly Exercise (Minutes/Week): 160, Intensity: Mild, Exercise limited by: None identified  Goals    . DIET - INCREASE WATER INTAKE     Recommend to drink at least 6-8 8oz glasses of water per day.    . Increase physical activity     Recommend increasing physical activity to 150 minutes per week       Fall Risk Fall Risk  05/10/2019 01/19/2018 01/16/2017 01/16/2017 12/05/2015  Falls in the past year? 0 No No No No  Number falls in past yr: 0 - - - -  Injury with Fall? 0 - - - -  Risk for fall due to : - Impaired vision - - -  Risk for fall due to: Comment - wears eyeglasses, glaucoma - - -  Follow up Falls prevention discussed - - - -   FALL RISK PREVENTION PERTAINING TO THE HOME:  Any stairs in or around the home? Yes  If so, do they handrails? Yes   Home free of loose throw rugs in walkways, pet beds, electrical cords, etc? Yes  Adequate lighting in your home to reduce risk of falls? Yes   ASSISTIVE DEVICES UTILIZED TO PREVENT FALLS:  Life alert? No  Use of a cane, walker or w/c? No  Grab bars in the bathroom? Yes  Shower chair or bench in shower? Yes  Elevated toilet seat or a handicapped toilet?  Yes   DME ORDERS:  DME order needed?  No   TIMED UP AND GO:  Was the test performed? Yes .  Length of time to ambulate 10 feet: 5 sec.   GAIT:  Appearance of gait: Gait stead-fast and without the use of an assistive device.    Education: Fall risk prevention has been discussed.  Intervention(s) required? No   Depression Screen PHQ 2/9 Scores 05/10/2019 04/17/2018 01/19/2018 01/16/2017  PHQ - 2 Score 0 0 0 0  PHQ- 9 Score - - 0 -     Cognitive Function     6CIT Screen 05/10/2019 01/19/2018 01/16/2017  What Year? 0 points 0 points 0 points  What month? 0 points 0 points 0 points  What time? 0 points 0 points 0 points  Count back from 20 0 points 0 points 0 points  Months in reverse 2 points 0 points 0 points  Repeat phrase 4 points 2 points 0 points  Total Score 6 2 0    Immunization History  Administered Date(s) Administered  . Fluad Quad(high Dose 65+) 05/10/2019  . Influenza, High Dose Seasonal PF 05/23/2017, 04/17/2018  . Influenza-Unspecified 05/11/2015, 05/23/2017  . Pneumococcal Conjugate-13 06/07/2015  . Pneumococcal Polysaccharide-23 12/01/2012  . Tdap 11/27/2009  . Zoster 01/11/2011    Qualifies for Shingles Vaccine? Yes  Zostavax completed 2012. Due for Shingrix. Education has been provided regarding the importance of this vaccine. Pt has been advised to call insurance company to determine out of pocket expense. Advised may also receive vaccine at local pharmacy or Health Dept. Verbalized acceptance and understanding.  Tdap: Up to date  Flu Vaccine: Due for Flu vaccine. Does the patient want to receive this vaccine today?  Yes . Education has been provided regarding the importance of this vaccine but still declined. Advised may receive this vaccine at local pharmacy or Health Dept. Aware to provide a copy of the vaccination record  if obtained from local pharmacy or Health Dept. Verbalized acceptance and understanding.  Pneumococcal Vaccine: Up to date    Screening Tests Health Maintenance  Topic Date Due  . HEMOGLOBIN A1C  10/16/2018  . OPHTHALMOLOGY EXAM  11/29/2018  . COLONOSCOPY  02/18/2019  . INFLUENZA VACCINE  02/27/2019  . FOOT EXAM  04/18/2019  . URINE MICROALBUMIN  04/18/2019  . MAMMOGRAM  06/02/2019  . TETANUS/TDAP  11/28/2019  . DEXA SCAN  Completed  . Hepatitis C Screening  Completed  . PNA vac Low Risk Adult  Completed    Cancer Screenings:  Colorectal Screening: Completed 02/17/09. Repeat every 10 years; Requested to postpone until next year.   Mammogram: Completed 06/01/18. Repeat every year; Scheduled for 06/03/19   Bone Density: Completed 06/01/18. Results reflect OSTEOPOROSIS. Repeat every 2 years.   Lung Cancer Screening: (Low Dose CT Chest recommended if Age 89-80 years, 30 pack-year currently smoking OR have quit w/in 15years.) does not qualify.   Additional Screening:  Hepatitis C Screening: does qualify; Completed 12/05/15  Vision Screening: Recommended annual ophthalmology exams for early detection of glaucoma and other disorders of the eye. Is the patient up to date with their annual eye exam?  Yes  Who is the provider or what is the name of the office in which the pt attends annual eye exams? Dr. Ellin Mayhew  Dental Screening: Recommended annual dental exams for proper oral hygiene  Community Resource Referral:  CRR required this visit?  No      Plan:     I have personally reviewed and addressed the Medicare Annual Wellness questionnaire and have noted the following in the patient's chart:  A. Medical and social history B. Use of alcohol, tobacco or illicit drugs  C. Current medications and supplements D. Functional ability and status E.  Nutritional status F.  Physical activity G. Advance directives H. List of other physicians I.  Hospitalizations, surgeries, and ER visits in previous 12 months J.  [place] such as hearing and vision if needed, cognitive and depression L. Referrals  and appointments   In addition, I have reviewed and discussed with patient certain preventive protocols, quality metrics, and best practice recommendations. A written personalized care plan for preventive services as well as general preventive health recommendations were provided to patient.   Signed,  Clemetine Marker, LPN Nurse Health Advisor   Nurse Notes: pt c/o swelling in ankles off an on during the summer but has recently started taking turmeric and thinks it may be helping with inflammation. Advised to discuss at CPE next week with Dr. Army Melia

## 2019-05-16 ENCOUNTER — Other Ambulatory Visit: Payer: Self-pay | Admitting: Internal Medicine

## 2019-05-16 DIAGNOSIS — I10 Essential (primary) hypertension: Secondary | ICD-10-CM

## 2019-05-18 ENCOUNTER — Other Ambulatory Visit: Payer: Self-pay

## 2019-05-18 ENCOUNTER — Encounter: Payer: Self-pay | Admitting: Internal Medicine

## 2019-05-18 ENCOUNTER — Other Ambulatory Visit: Payer: Self-pay | Admitting: Internal Medicine

## 2019-05-18 ENCOUNTER — Ambulatory Visit (INDEPENDENT_AMBULATORY_CARE_PROVIDER_SITE_OTHER): Payer: Medicare Other | Admitting: Internal Medicine

## 2019-05-18 VITALS — BP 132/80 | HR 103 | Ht 61.0 in | Wt 150.4 lb

## 2019-05-18 DIAGNOSIS — I10 Essential (primary) hypertension: Secondary | ICD-10-CM

## 2019-05-18 DIAGNOSIS — Z1231 Encounter for screening mammogram for malignant neoplasm of breast: Secondary | ICD-10-CM | POA: Diagnosis not present

## 2019-05-18 DIAGNOSIS — E782 Mixed hyperlipidemia: Secondary | ICD-10-CM | POA: Insufficient documentation

## 2019-05-18 DIAGNOSIS — Z23 Encounter for immunization: Secondary | ICD-10-CM

## 2019-05-18 DIAGNOSIS — E785 Hyperlipidemia, unspecified: Secondary | ICD-10-CM | POA: Diagnosis not present

## 2019-05-18 DIAGNOSIS — L309 Dermatitis, unspecified: Secondary | ICD-10-CM

## 2019-05-18 DIAGNOSIS — E1169 Type 2 diabetes mellitus with other specified complication: Secondary | ICD-10-CM

## 2019-05-18 DIAGNOSIS — Z0000 Encounter for general adult medical examination without abnormal findings: Secondary | ICD-10-CM

## 2019-05-18 DIAGNOSIS — Z1211 Encounter for screening for malignant neoplasm of colon: Secondary | ICD-10-CM | POA: Diagnosis not present

## 2019-05-18 DIAGNOSIS — E118 Type 2 diabetes mellitus with unspecified complications: Secondary | ICD-10-CM

## 2019-05-18 LAB — POCT URINALYSIS DIPSTICK
Bilirubin, UA: NEGATIVE
Blood, UA: NEGATIVE
Glucose, UA: NEGATIVE
Ketones, UA: NEGATIVE
Leukocytes, UA: NEGATIVE
Nitrite, UA: NEGATIVE
Protein, UA: NEGATIVE
Spec Grav, UA: 1.01 (ref 1.010–1.025)
Urobilinogen, UA: 0.2 E.U./dL
pH, UA: 6.5 (ref 5.0–8.0)

## 2019-05-18 MED ORDER — SHINGRIX 50 MCG/0.5ML IM SUSR: 0.5000 mL | Freq: Once | INTRAMUSCULAR | 1 refills | Status: AC

## 2019-05-18 MED ORDER — TRIAMCINOLONE ACETONIDE 0.1 % EX OINT: 1.0000 "application " | TOPICAL_OINTMENT | Freq: Two times a day (BID) | CUTANEOUS | 0 refills | Status: DC

## 2019-05-18 MED ORDER — IRBESARTAN 300 MG PO TABS: 300.0000 mg | ORAL_TABLET | Freq: Every day | ORAL | 1 refills | Status: DC

## 2019-05-18 MED ORDER — ATORVASTATIN CALCIUM 10 MG PO TABS: 10.0000 mg | ORAL_TABLET | ORAL | 3 refills | Status: DC

## 2019-05-18 NOTE — Patient Instructions (Signed)
Take Irbesartan 300 mg once a day Continue HCTZ daily Cut Amlodipine in half and take 5 mg daily - monitor BP and may be able stop after month.

## 2019-05-18 NOTE — Progress Notes (Signed)
Date:  05/18/2019   Name:  Bethany Martinez   DOB:  1946/03/17   MRN:  [id number]   Chief Complaint: Annual Exam (Just had MAW on 05/10/19 with [place] [facility].) and Diabetes (Document E11.69 for [facility]. Foot exam and micro.) Bethany Martinez is a 73 y.o. female who presents today for her Complete Annual Exam. She feels well. She reports exercising walking 4 days a week. She reports she is sleeping well.   Mammogram - 04/2018 - scheduled DEXA  2019 Immunizations UTD Colonoscopy - 2010 Dr. Jamal Collin  Diabetes She presents for her follow-up diabetic visit. She has type 2 diabetes mellitus. Her disease course has been stable. Pertinent negatives for hypoglycemia include no dizziness, headaches, nervousness/anxiousness or tremors. Pertinent negatives for diabetes include no chest pain, no fatigue, no polydipsia and no polyuria. Current diabetic treatment includes diet. Her weight is stable. She is following a diabetic diet. When asked about meal planning, she reported none. An ACE inhibitor/angiotensin II receptor blocker is not being taken. Eye exam is not current.  Hypertension This is a chronic problem. The problem is controlled (140/90 at home). Associated symptoms include peripheral edema. Pertinent negatives include no chest pain, headaches, palpitations or shortness of breath. There are no associated agents to hypertension. Past treatments include calcium channel blockers and diuretics. The current treatment provides significant improvement. There are no compliance problems.   Hyperlipidemia This is a chronic problem. The problem is uncontrolled. Pertinent negatives include no chest pain or shortness of breath. Current antihyperlipidemic treatment includes statins. The current treatment provides significant improvement of lipids. Compliance problems include medication side effects (last year suggested three times a week rather than daily dosing and tolerating it well).    Lab Results  Component  Value Date   HGBA1C 6.0 (H) 04/17/2018   Lab Results  Component Value Date   CREATININE 0.80 04/17/2018   BUN 11 04/17/2018   NA 140 04/17/2018   K 3.4 (L) 04/17/2018   CL 94 (L) 04/17/2018   CO2 23 04/17/2018   Lab Results  Component Value Date   CHOL 281 (H) 04/17/2018   HDL 63 04/17/2018   LDLCALC 191 (H) 04/17/2018   TRIG 133 04/17/2018   CHOLHDL 4.5 (H) 04/17/2018     Review of Systems  Constitutional: Negative for chills, fatigue and fever.  HENT: Negative for congestion, hearing loss, tinnitus, trouble swallowing and voice change.   Eyes: Negative for visual disturbance.  Respiratory: Negative for cough, chest tightness, shortness of breath and wheezing.   Cardiovascular: Positive for leg swelling. Negative for chest pain and palpitations.  Gastrointestinal: Negative for abdominal pain, constipation, diarrhea and vomiting.  Endocrine: Negative for polydipsia and polyuria.  Genitourinary: Negative for dysuria, frequency, genital sores, vaginal bleeding and vaginal discharge.  Musculoskeletal: Negative for arthralgias, gait problem and joint swelling.  Skin: Negative for color change and rash.  Neurological: Negative for dizziness, tremors, light-headedness and headaches.  Hematological: Negative for adenopathy. Does not bruise/bleed easily.  Psychiatric/Behavioral: Negative for dysphoric mood and sleep disturbance. The patient is not nervous/anxious.     Patient Active Problem List   Diagnosis Date Noted  . Hyperlipidemia associated with type 2 diabetes mellitus ([place]) 05/18/2019  . Eczema 07/18/2017  . Type II diabetes mellitus with complication ([place]) 99991111  . Edema extremities 01/11/2015  . Allergic state 01/11/2015  . Essential (primary) hypertension 01/11/2015    No Known Allergies  Past Surgical History:  Procedure Laterality Date  . ABDOMINAL HYSTERECTOMY  1988  partial  . COLONOSCOPY  01/2009   Dr. Jamal Collin    Social History   Tobacco Use  .  Smoking status: Never Smoker  . Smokeless tobacco: Never Used  . Tobacco comment: smoking cessation materials not required  Substance Use Topics  . Alcohol use: No    Alcohol/week: 0.0 standard drinks  . Drug use: No     Medication list has been reviewed and updated.  Current Meds  Medication Sig  . amLODipine (NORVASC) 10 MG tablet TAKE 1 TABLET BY MOUTH EVERY DAY  . aspirin EC 81 MG tablet Take 81 mg by mouth daily. Patient taking 3 times per week  . [START ON 05/19/2019] atorvastatin (LIPITOR) 10 MG tablet Take 1 tablet (10 mg total) by mouth 3 (three) times a week.  Marland Kitchen azelastine (OPTIVAR) 0.05 % ophthalmic solution Place 1 drop into both eyes 2 (two) times daily.  . Calcium-Phosphorus-Vitamin D (CALCIUM/D3 ADULT GUMMIES PO) Take by mouth.  . hydrochlorothiazide (HYDRODIURIL) 25 MG tablet TAKE 1 TABLET BY MOUTH EVERY DAY  . latanoprost (XALATAN) 0.005 % ophthalmic solution 1 drop at bedtime.  . triamcinolone ointment (KENALOG) 0.1 % Apply 1 application topically 2 (two) times daily. To knees and elbows  . TURMERIC CURCUMIN PO Take by mouth.  . [DISCONTINUED] atorvastatin (LIPITOR) 10 MG tablet TAKE 1 TABLET (10 MG TOTAL) BY MOUTH 3 (THREE) TIMES A WEEK.  . [DISCONTINUED] triamcinolone ointment (KENALOG) 0.1 % Apply 1 application topically 2 (two) times daily.    PHQ 2/9 Scores 05/10/2019 04/17/2018 01/19/2018 01/16/2017  PHQ - 2 Score 0 0 0 0  PHQ- 9 Score - - 0 -    BP Readings from Last 3 Encounters:  05/18/19 132/80  05/10/19 (!) 142/86  06/03/18 124/66    Physical Exam Vitals signs and nursing note reviewed.  Constitutional:      General: She is not in acute distress.    Appearance: She is well-developed.  HENT:     Head: Normocephalic and atraumatic.     Right Ear: Tympanic membrane and ear canal normal.     Left Ear: Tympanic membrane and ear canal normal.     Nose:     Right Sinus: No maxillary sinus tenderness.     Left Sinus: No maxillary sinus tenderness.   Eyes:     General: No scleral icterus.       Right eye: No discharge.        Left eye: No discharge.     Conjunctiva/sclera: Conjunctivae normal.  Neck:     Musculoskeletal: Normal range of motion. No erythema.     Thyroid: No thyromegaly.     Vascular: No carotid bruit.  Cardiovascular:     Rate and Rhythm: Normal rate and regular rhythm.     Pulses: Normal pulses.     Heart sounds: Normal heart sounds. No murmur.  Pulmonary:     Effort: Pulmonary effort is normal. No respiratory distress.     Breath sounds: No wheezing.  Chest:     Breasts:        Right: No mass, nipple discharge, skin change or tenderness.        Left: No mass, nipple discharge, skin change or tenderness.  Abdominal:     General: Bowel sounds are normal.     Palpations: Abdomen is soft.     Tenderness: There is no abdominal tenderness.  Musculoskeletal: Normal range of motion.     Right lower leg: 1+ Pitting Edema present.  Left lower leg: 1+ Pitting Edema present.  Lymphadenopathy:     Cervical: No cervical adenopathy.  Skin:    General: Skin is warm and dry.     Capillary Refill: Capillary refill takes less than 2 seconds.     Findings: No rash.  Neurological:     General: No focal deficit present.     Mental Status: She is alert and oriented to person, place, and time.     Cranial Nerves: No cranial nerve deficit.     Sensory: No sensory deficit.     Deep Tendon Reflexes: Reflexes are normal and symmetric.  Psychiatric:        Attention and Perception: Attention normal.        Mood and Affect: Mood normal.        Speech: Speech normal.        Behavior: Behavior normal.        Thought Content: Thought content normal.        Cognition and Memory: Cognition normal.     Wt Readings from Last 3 Encounters:  05/18/19 150 lb 6.4 oz (68.2 kg)  05/10/19 153 lb 12.8 oz (69.8 kg)  06/03/18 144 lb (65.3 kg)    BP 132/80   Pulse (!) 103   Ht 5\' 1"  (1.549 m)   Wt 150 lb 6.4 oz (68.2 kg)   SpO2  100%   BMI 28.42 kg/m   Assessment and Plan: 1. Annual physical exam Normal exam Continue healthy diet and exercise - POCT urinalysis dipstick  2. Encounter for screening mammogram for breast cancer scheduled  3. Hyperlipidemia associated with type 2 diabetes mellitus ([place]) Previously not controlled but now on statin three times a week Will check labs and advise - Lipid panel  4. Type II diabetes mellitus with complication (HCC) Clinically stable by exam and report without s/s of hypoglycemia. DM complicated by HTN, lipids. Controlled with diet alone - Comprehensive metabolic panel - Hemoglobin A1c - Microalbumin / creatinine urine ratio  5. Essential (primary) hypertension Clinically stable exam with well controlled BP.   However, having edema likely secondary to amlodipine 10 mg. Will reduce amlodipine to 5 mg per day, add Avapro and continue HCTZ May be able to stop amlodipine completely after a month depending on BP readings. Pt to continue current regimen and low sodium diet; benefits of regular exercise as able discussed. - CBC with Differential/Platelet - TSH - irbesartan (AVAPRO) 300 MG tablet; Take 1 tablet (300 mg total) by mouth daily.  Dispense: 90 tablet; Refill: 1  6. Colon cancer screening Pt to schedule with General surgery  7. Need for shingles vaccine - Zoster Vaccine Adjuvanted [facility]) injection; Inject 0.5 mLs into the muscle once for 1 dose.  Dispense: 0.5 mL; Refill: 1  8. Eczema, unspecified type - triamcinolone ointment (KENALOG) 0.1 %; Apply 1 application topically 2 (two) times daily. To knees and elbows  Dispense: 80 g; Refill: 0   Partially dictated using Editor, commissioning. Any errors are unintentional.  Halina Maidens, MD [facility]  05/18/2019

## 2019-05-19 LAB — COMPREHENSIVE METABOLIC PANEL
ALT: 29 IU/L (ref 0–32)
AST: 23 IU/L (ref 0–40)
Albumin/Globulin Ratio: 1.4 (ref 1.2–2.2)
Albumin: 4.6 g/dL (ref 3.7–4.7)
Alkaline Phosphatase: 91 IU/L (ref 39–117)
BUN/Creatinine Ratio: 13 (ref 12–28)
BUN: 11 mg/dL (ref 8–27)
Bilirubin Total: 0.7 mg/dL (ref 0.0–1.2)
CO2: 29 mmol/L (ref 20–29)
Calcium: 9.8 mg/dL (ref 8.7–10.3)
Chloride: 95 mmol/L — ABNORMAL LOW (ref 96–106)
Creatinine, Ser: 0.87 mg/dL (ref 0.57–1.00)
GFR calc Af Amer: 77 mL/min/{1.73_m2} (ref >59)
GFR calc non Af Amer: 67 mL/min/{1.73_m2} (ref >59)
Globulin, Total: 3.2 g/dL (ref 1.5–4.5)
Glucose: 123 mg/dL — ABNORMAL HIGH (ref 65–99)
Potassium: 3 mmol/L — ABNORMAL LOW (ref 3.5–5.2)
Sodium: 137 mmol/L (ref 134–144)
Total Protein: 7.8 g/dL (ref 6.0–8.5)

## 2019-05-19 LAB — CBC WITH DIFFERENTIAL/PLATELET
Basophils Absolute: 0 10*3/uL (ref 0.0–0.2)
Basos: 0 %
EOS (ABSOLUTE): 0.1 10*3/uL (ref 0.0–0.4)
Eos: 1 %
Hematocrit: 40.2 % (ref 34.0–46.6)
Hemoglobin: 13.4 g/dL (ref 11.1–15.9)
Immature Grans (Abs): 0.1 10*3/uL (ref 0.0–0.1)
Immature Granulocytes: 1 %
Lymphocytes Absolute: 1.4 10*3/uL (ref 0.7–3.1)
Lymphs: 22 %
MCH: 27.4 pg (ref 26.6–33.0)
MCHC: 33.3 g/dL (ref 31.5–35.7)
MCV: 82 fL (ref 79–97)
Monocytes Absolute: 0.7 10*3/uL (ref 0.1–0.9)
Monocytes: 10 %
Neutrophils Absolute: 4.3 10*3/uL (ref 1.4–7.0)
Neutrophils: 66 %
Platelets: 335 10*3/uL (ref 150–450)
RBC: 4.89 x10E6/uL (ref 3.77–5.28)
RDW: 14.5 % (ref 11.7–15.4)
WBC: 6.5 10*3/uL (ref 3.4–10.8)

## 2019-05-19 LAB — LIPID PANEL
Chol/HDL Ratio: 4 ratio (ref 0.0–4.4)
Cholesterol, Total: 217 mg/dL — ABNORMAL HIGH (ref 100–199)
HDL: 54 mg/dL (ref >39)
LDL Chol Calc (NIH): 141 mg/dL — ABNORMAL HIGH (ref 0–99)
Triglycerides: 122 mg/dL (ref 0–149)
VLDL Cholesterol Cal: 22 mg/dL (ref 5–40)

## 2019-05-19 LAB — TSH: TSH: 2.18 u[IU]/mL (ref 0.450–4.500)

## 2019-05-19 LAB — HEMOGLOBIN A1C
Est. average glucose Bld gHb Est-mCnc: 126 mg/dL
Hgb A1c MFr Bld: 6 % — ABNORMAL HIGH (ref 4.8–5.6)

## 2019-05-20 LAB — MICROALBUMIN / CREATININE URINE RATIO
Creatinine, Urine: 61.8 mg/dL
Microalb/Creat Ratio: 14 mg/g creat (ref 0–29)
Microalbumin, Urine: 8.8 ug/mL

## 2019-05-25 ENCOUNTER — Encounter: Payer: Self-pay | Admitting: Internal Medicine

## 2019-06-03 ENCOUNTER — Other Ambulatory Visit: Payer: Self-pay

## 2019-06-03 ENCOUNTER — Ambulatory Visit
Admission: RE | Admit: 2019-06-03 | Discharge: 2019-06-03 | Disposition: A | Discharge status: Home / Self Care | Payer: Medicare Other | Source: Ambulatory Visit | Attending: Internal Medicine | Admitting: Internal Medicine

## 2019-06-03 DIAGNOSIS — Z1231 Encounter for screening mammogram for malignant neoplasm of breast: Secondary | ICD-10-CM | POA: Insufficient documentation

## 2019-06-09 ENCOUNTER — Telehealth: Payer: Self-pay

## 2019-06-09 NOTE — Telephone Encounter (Signed)
Pt called saying she was changed from amlodipine 10 mg to 5 mg, continue hctz, and added new medication for Irbesartan 300 mg daily starting 1 month ago.   She is experiencing symptoms of dizziness, lightheadedness, heartburn, and diarrhea.  Spoke with Dr. Army Melia and told patient to stop Irbesartan for 1 week. Continue HCTZ and 5 mg of amlodipine. Call me back in 1 week and report if she is feeling better or not.  Patient verbalized understanding and will call back in 1 week.  Benedict Needy, CMA

## 2019-06-17 ENCOUNTER — Telehealth: Payer: Self-pay

## 2019-06-17 NOTE — Telephone Encounter (Signed)
Patient called to follow up after 1 week without her BP medication Irbesartan, as told by our office. .   She WAS experiencing symptoms of dizziness, lightheadedness, heartburn, and diarrhea. Pt sxs have now gone away after stopping the medication.   Wants to know what she needs to do about blood pressure medication.  - Please advise.

## 2019-06-17 NOTE — Telephone Encounter (Signed)
If she is cutting the amlodipine in half for 5 mg and taking the HCTZ then try adding back 1/2 of the Avapro 300 mg.

## 2019-06-18 NOTE — Telephone Encounter (Signed)
Patient will do this.

## 2019-06-18 NOTE — Telephone Encounter (Signed)
Patient will callback in 1-2 weeks to follow up.

## 2019-07-14 ENCOUNTER — Telehealth: Payer: Self-pay

## 2019-07-14 NOTE — Telephone Encounter (Signed)
Called and spoke with pt to follow up and see if she has had a colonoscopy. She said she spoke with her insurance and they are mailing her out a FIT test and she will get this done as soon as possible.  Benedict Needy, CMA

## 2019-09-01 ENCOUNTER — Telehealth: Payer: Self-pay | Admitting: Internal Medicine

## 2019-09-01 NOTE — Telephone Encounter (Signed)
FIT test is negative.

## 2019-09-01 NOTE — Telephone Encounter (Signed)
Patient informed. 

## 2019-09-23 ENCOUNTER — Encounter: Payer: Self-pay | Admitting: Internal Medicine

## 2019-09-23 ENCOUNTER — Ambulatory Visit (INDEPENDENT_AMBULATORY_CARE_PROVIDER_SITE_OTHER): Payer: Medicare Other | Admitting: Internal Medicine

## 2019-09-23 ENCOUNTER — Other Ambulatory Visit: Payer: Self-pay

## 2019-09-23 VITALS — BP 132/78 | HR 84 | Temp 97.1°F | Ht 61.0 in | Wt 157.0 lb

## 2019-09-23 DIAGNOSIS — I10 Essential (primary) hypertension: Secondary | ICD-10-CM

## 2019-09-23 DIAGNOSIS — E118 Type 2 diabetes mellitus with unspecified complications: Secondary | ICD-10-CM

## 2019-09-23 MED ORDER — IRBESARTAN 150 MG PO TABS: 150.0000 mg | ORAL_TABLET | Freq: Every day | ORAL | 1 refills | Status: DC

## 2019-09-23 MED ORDER — AMLODIPINE BESYLATE 5 MG PO TABS: 5.0000 mg | ORAL_TABLET | Freq: Every day | ORAL | 3 refills | Status: DC

## 2019-09-23 NOTE — Progress Notes (Signed)
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  She has some help from her daughter but otherwise it is full time.  Diabetes She presents for her follow-up diabetic visit. She has type 2 diabetes mellitus. Her disease course has been stable. Pertinent negatives for hypoglycemia include no dizziness, headaches or tremors. Pertinent negatives for diabetes include no chest pain, no fatigue, no polydipsia and no polyuria. Current diabetic treatment includes diet. Her weight is stable. There is no compliance with monitoring of blood glucose. An ACE inhibitor/angiotensin II receptor blocker is being taken.  Hypertension This is a chronic problem. The problem is controlled (at home 130/80). Pertinent negatives include no chest pain, headaches, palpitations or shortness of breath. Past treatments include calcium channel blockers, angiotensin blockers and diuretics. The current treatment provides significant improvement.    Lab Results  Component Value Date   CREATININE 0.87 05/18/2019   BUN 11 05/18/2019   NA 137 05/18/2019   K 3.0 (L) 05/18/2019   CL 95 (L) 05/18/2019   CO2 29 05/18/2019   Lab Results  Component Value Date   CHOL 217 (H) 05/18/2019   HDL 54 05/18/2019   LDLCALC 141 (H) 05/18/2019   TRIG 122 05/18/2019   CHOLHDL 4.0 05/18/2019   Lab Results  Component Value Date   TSH 2.180 05/18/2019   Lab Results  Component Value Date   HGBA1C 6.0 (H) 05/18/2019     Review of Systems  Constitutional: Negative for appetite change, fatigue, fever and unexpected weight change (gained a few pounds).  HENT: Negative for tinnitus and trouble swallowing.   Eyes: Negative for visual disturbance.  Respiratory: Negative for cough, chest tightness and shortness of breath.   Cardiovascular: Negative for chest pain, palpitations and leg  swelling.  Gastrointestinal: Positive for diarrhea (three soft stools per day). Negative for abdominal pain.  Endocrine: Negative for polydipsia and polyuria.  Genitourinary: Negative for dysuria and hematuria.  Musculoskeletal: Negative for arthralgias.  Neurological: Negative for dizziness, tremors, numbness and headaches.  Psychiatric/Behavioral: Negative for dysphoric mood.    Patient Active Problem List   Diagnosis Date Noted  . Hyperlipidemia associated with type 2 diabetes mellitus ([place]) 05/18/2019  . Eczema 07/18/2017  . Type II diabetes mellitus with complication ([place]) 99991111  . Edema extremities 01/11/2015  . Allergic state 01/11/2015  . Essential (primary) hypertension 01/11/2015    No Known Allergies  Past Surgical History:  Procedure Laterality Date  . ABDOMINAL HYSTERECTOMY  1988   partial  . COLONOSCOPY  01/2009   Dr. Jamal Collin    Social History   Tobacco Use  . Smoking status: Never Smoker  . Smokeless tobacco: Never Used  . Tobacco comment: smoking cessation materials not required  Substance Use Topics  . Alcohol use: No    Alcohol/week: 0.0 standard drinks  . Drug use: No     Medication list has been reviewed and updated.  Current Meds  Medication Sig  . amLODipine (NORVASC) 10 MG tablet TAKE 1 TABLET BY MOUTH EVERY DAY  . aspirin EC 81 MG tablet Take 81 mg by mouth daily. Patient taking 3 times per week  . atorvastatin (LIPITOR) 10 MG tablet Take 1 tablet (10 mg total) by mouth 3 (three) times a week.  Marland Kitchen azelastine (OPTIVAR) 0.05 % ophthalmic solution Place 1 drop into both  eyes 2 (two) times daily.  . Calcium-Phosphorus-Vitamin D (CALCIUM/D3 ADULT GUMMIES PO) Take by mouth.  . hydrochlorothiazide (HYDRODIURIL) 25 MG tablet TAKE 1 TABLET BY MOUTH EVERY DAY  . irbesartan (AVAPRO) 300 MG tablet Take 1 tablet (300 mg total) by mouth daily. (Patient taking differently: Take 150 mg by mouth daily. )  . latanoprost (XALATAN) 0.005 % ophthalmic  solution 1 drop at bedtime.  . triamcinolone ointment (KENALOG) 0.1 % Apply 1 application topically 2 (two) times daily. To knees and elbows  . TURMERIC CURCUMIN PO Take by mouth.    PHQ 2/9 Scores 09/23/2019 05/10/2019 04/17/2018 01/19/2018  PHQ - 2 Score 0 0 0 0  PHQ- 9 Score 4 - - 0    BP Readings from Last 3 Encounters:  09/23/19 132/78  05/18/19 132/80  05/10/19 (!) 142/86    Physical Exam Vitals and nursing note reviewed.  Constitutional:      General: She is not in acute distress.    Appearance: Normal appearance. She is well-developed.  HENT:     Head: Normocephalic and atraumatic.  Neck:     Vascular: No carotid bruit.  Cardiovascular:     Rate and Rhythm: Normal rate and regular rhythm.     Pulses: Normal pulses.     Heart sounds: No murmur.  Pulmonary:     Effort: Pulmonary effort is normal. No respiratory distress.     Breath sounds: No wheezing or rhonchi.  Musculoskeletal:        General: Normal range of motion.     Cervical back: Normal range of motion.     Right lower leg: No edema.     Left lower leg: No edema.  Lymphadenopathy:     Cervical: No cervical adenopathy.  Skin:    General: Skin is warm and dry.     Capillary Refill: Capillary refill takes less than 2 seconds.     Findings: No rash.  Neurological:     General: No focal deficit present.     Mental Status: She is alert and oriented to person, place, and time.  Psychiatric:        Attention and Perception: Attention normal.        Mood and Affect: Mood normal.        Behavior: Behavior normal.        Thought Content: Thought content normal.     Wt Readings from Last 3 Encounters:  09/23/19 157 lb (71.2 kg)  05/18/19 150 lb 6.4 oz (68.2 kg)  05/10/19 153 lb 12.8 oz (69.8 kg)    BP 132/78   Pulse 84   Temp (!) 97.1 F (36.2 C) (Oral)   Ht 5\' 1"  (1.549 m)   Wt 157 lb (71.2 kg)   SpO2 100%   BMI 29.66 kg/m   Assessment and Plan: 1. Type II diabetes mellitus with complication  (HCC) Clinically stable by exam and report without s/s of hypoglycemia. DM complicated by htn. Resuming exercise; continue healthy diet without medications at this time - Hemoglobin A1c  2. Essential (primary) hypertension Clinically stable exam with well controlled BP on amlodipine 5 mg and Avapro 150 mg.. Tolerating medications without side effects at this time. Pt to continue current regimen and low sodium diet; benefits of regular exercise as able discussed. - irbesartan (AVAPRO) 150 MG tablet; Take 1 tablet (150 mg total) by mouth daily.  Dispense: 90 tablet; Refill: 1 - amLODipine (NORVASC) 5 MG tablet; Take 1 tablet (5 mg total) by mouth daily.  Dispense: 90 tablet; Refill: 3 - Basic metabolic panel   Partially dictated using Editor, commissioning. Any errors are unintentional.  Halina Maidens, MD [facility]  09/23/2019

## 2019-09-24 LAB — BASIC METABOLIC PANEL
BUN/Creatinine Ratio: 12 (ref 12–28)
BUN: 11 mg/dL (ref 8–27)
CO2: 25 mmol/L (ref 20–29)
Calcium: 9.8 mg/dL (ref 8.7–10.3)
Chloride: 99 mmol/L (ref 96–106)
Creatinine, Ser: 0.91 mg/dL (ref 0.57–1.00)
GFR calc Af Amer: 72 mL/min/{1.73_m2} (ref >59)
GFR calc non Af Amer: 63 mL/min/{1.73_m2} (ref >59)
Glucose: 82 mg/dL (ref 65–99)
Potassium: 3.8 mmol/L (ref 3.5–5.2)
Sodium: 140 mmol/L (ref 134–144)

## 2019-09-24 LAB — HEMOGLOBIN A1C
Est. average glucose Bld gHb Est-mCnc: 126 mg/dL
Hgb A1c MFr Bld: 6 % — ABNORMAL HIGH (ref 4.8–5.6)

## 2020-03-18 ENCOUNTER — Other Ambulatory Visit: Payer: Self-pay | Admitting: Internal Medicine

## 2020-03-18 DIAGNOSIS — I10 Essential (primary) hypertension: Secondary | ICD-10-CM

## 2020-03-18 NOTE — Telephone Encounter (Signed)
Requested Prescriptions  Pending Prescriptions Disp Refills   irbesartan (AVAPRO) 150 MG tablet [Pharmacy Med Name: IRBESARTAN 150 MG TABLET] 90 tablet 0    Sig: TAKE 1 TABLET BY MOUTH EVERY DAY     Cardiovascular:  Angiotensin Receptor Blockers Passed - 03/18/2020 12:46 AM      Passed - Cr in normal range and within 180 days    Creatinine, Ser  Date Value Ref Range Status  09/23/2019 0.91 0.57 - 1.00 mg/dL Final         Passed - K in normal range and within 180 days    Potassium  Date Value Ref Range Status  09/23/2019 3.8 3.5 - 5.2 mmol/L Final         Passed - Patient is not pregnant      Passed - Last BP in normal range    BP Readings from Last 1 Encounters:  09/23/19 132/78         Passed - Valid encounter within last 6 months    Recent Outpatient Visits          5 months ago Type II diabetes mellitus with complication [facility])   [facility] Glean Hess, MD   10 months ago Annual physical exam   [facility] Glean Hess, MD   1 year ago Herpes zoster without complication   [facility] Glean Hess, MD   1 year ago Annual physical exam   [facility] Glean Hess, MD   2 years ago Essential (primary) hypertension   [facility] Glean Hess, MD      Future Appointments            In 2 months Army Melia Jesse Sans, MD [facility], [facility]

## 2020-05-02 ENCOUNTER — Telehealth: Payer: Self-pay | Admitting: Internal Medicine

## 2020-05-02 DIAGNOSIS — H401131 Primary open-angle glaucoma, bilateral, mild stage: Secondary | ICD-10-CM | POA: Diagnosis not present

## 2020-05-02 DIAGNOSIS — H2513 Age-related nuclear cataract, bilateral: Secondary | ICD-10-CM | POA: Diagnosis not present

## 2020-05-02 DIAGNOSIS — H1045 Other chronic allergic conjunctivitis: Secondary | ICD-10-CM | POA: Diagnosis not present

## 2020-05-02 DIAGNOSIS — I10 Essential (primary) hypertension: Secondary | ICD-10-CM

## 2020-05-02 DIAGNOSIS — H40013 Open angle with borderline findings, low risk, bilateral: Secondary | ICD-10-CM | POA: Diagnosis not present

## 2020-05-02 NOTE — Telephone Encounter (Signed)
Requested Prescriptions  Pending Prescriptions Disp Refills  . hydrochlorothiazide (HYDRODIURIL) 25 MG tablet [Pharmacy Med Name: HYDROCHLOROTHIAZIDE 25 MG TAB] 30 tablet 0    Sig: TAKE 1 TABLET BY MOUTH EVERY DAY     Cardiovascular: Diuretics - Thiazide Failed - 05/02/2020  1:24 AM      Failed - Valid encounter within last 6 months    Recent Outpatient Visits          7 months ago Type II diabetes mellitus with complication [facility])   [facility] Glean Hess, MD   11 months ago Annual physical exam   [facility] Glean Hess, MD   1 year ago Herpes zoster without complication   [facility] Glean Hess, MD   2 years ago Annual physical exam   [facility] Glean Hess, MD   2 years ago Essential (primary) hypertension   [place], [place]      Future Appointments            In 2 weeks Glean Hess, MD [facility] [place] in normal range and within 360 days    Calcium  Date Value Ref Range Status  09/23/2019 9.8 8.7 - 10.3 mg/dL Final         Passed - Cr in normal range and within 360 days    Creatinine, Ser  Date Value Ref Range Status  09/23/2019 0.91 0.57 - 1.00 mg/dL Final         Passed - K in normal range and within 360 days    Potassium  Date Value Ref Range Status  09/23/2019 3.8 3.5 - 5.2 mmol/L Final         Passed - Na in normal range and within 360 days    Sodium  Date Value Ref Range Status  09/23/2019 140 134 - 144 mmol/L Final         Passed - Last BP in normal range    BP Readings from Last 1 Encounters:  09/23/19 132/78         One month courtesy refill provided along with a reminder for patient to schedule an appointment for an office visit.

## 2020-05-10 ENCOUNTER — Ambulatory Visit: Payer: Medicare Other | Admitting: Internal Medicine

## 2020-05-10 ENCOUNTER — Other Ambulatory Visit: Payer: Self-pay

## 2020-05-10 ENCOUNTER — Ambulatory Visit (INDEPENDENT_AMBULATORY_CARE_PROVIDER_SITE_OTHER): Payer: Medicare Other

## 2020-05-10 VITALS — BP 124/82 | HR 84 | Temp 98.1°F | Resp 16 | Ht 61.0 in | Wt 155.6 lb

## 2020-05-10 DIAGNOSIS — Z0000 Encounter for general adult medical examination without abnormal findings: Secondary | ICD-10-CM | POA: Diagnosis not present

## 2020-05-10 DIAGNOSIS — Z23 Encounter for immunization: Secondary | ICD-10-CM

## 2020-05-10 DIAGNOSIS — Z780 Asymptomatic menopausal state: Secondary | ICD-10-CM | POA: Diagnosis not present

## 2020-05-10 DIAGNOSIS — Z1231 Encounter for screening mammogram for malignant neoplasm of breast: Secondary | ICD-10-CM | POA: Diagnosis not present

## 2020-05-10 NOTE — Patient Instructions (Signed)
Bethany Martinez , Thank you for taking time to come for your Medicare Wellness Visit. I appreciate your ongoing commitment to your health goals. Please review the following plan we discussed and let me know if I can assist you in the future.   Screening recommendations/referrals: Colonoscopy: declined Mammogram: done 06/03/19. Please call [phone] to schedule your mammogram and bone density screening.  Bone Density: done 06/01/18 Recommended yearly ophthalmology/optometry visit for glaucoma screening and checkup Recommended yearly dental visit for hygiene and checkup  Vaccinations: Influenza vaccine: done today Pneumococcal vaccine: done 06/07/15 Tdap vaccine: due Shingles vaccine: done 06/22/19 & 01/27/20   Covid-19:done 09/20/19 & 10/18/19  Advanced directives: Please bring a copy of your health care power of attorney and living will to the office at your convenience.  Conditions/risks identified: Recommend increasing physical activity to 3 days per week  Next appointment: Follow up in one year for your annual wellness visit    Preventive Care 65 Years and Older, Female Preventive care refers to lifestyle choices and visits with your health care provider that can promote health and wellness. What does preventive care include?  A yearly physical exam. This is also called an annual well check.  Dental exams once or twice a year.  Routine eye exams. Ask your health care provider how often you should have your eyes checked.  Personal lifestyle choices, including:  Daily care of your teeth and gums.  Regular physical activity.  Eating a healthy diet.  Avoiding tobacco and drug use.  Limiting alcohol use.  Practicing safe sex.  Taking low-dose aspirin every day.  Taking vitamin and mineral supplements as recommended by your health care provider. What happens during an annual well check? The services and screenings done by your health care provider during your annual well check  will depend on your age, overall health, lifestyle risk factors, and family history of disease. Counseling  Your health care provider may ask you questions about your:  Alcohol use.  Tobacco use.  Drug use.  Emotional well-being.  Home and relationship well-being.  Sexual activity.  Eating habits.  History of falls.  Memory and ability to understand (cognition).  Work and work Statistician.  Reproductive health. Screening  You may have the following tests or measurements:  Height, weight, and BMI.  Blood pressure.  Lipid and cholesterol levels. These may be checked every 5 years, or more frequently if you are over 96 years old.  Skin check.  Lung cancer screening. You may have this screening every year starting at age 78 if you have a 30-pack-year history of smoking and currently smoke or have quit within the past 15 years.  Fecal occult blood test (FOBT) of the stool. You may have this test every year starting at age 33.  Flexible sigmoidoscopy or colonoscopy. You may have a sigmoidoscopy every 5 years or a colonoscopy every 10 years starting at age 52.  Hepatitis C blood test.  Hepatitis B blood test.  Sexually transmitted disease (STD) testing.  Diabetes screening. This is done by checking your blood sugar (glucose) after you have not eaten for a while (fasting). You may have this done every 1-3 years.  Bone density scan. This is done to screen for osteoporosis. You may have this done starting at age 37.  Mammogram. This may be done every 1-2 years. Talk to your health care provider about how often you should have regular mammograms. Talk with your health care provider about your test results, treatment options, and if necessary,  the need for more tests. Vaccines  Your health care provider may recommend certain vaccines, such as:  Influenza vaccine. This is recommended every year.  Tetanus, diphtheria, and acellular pertussis (Tdap, Td) vaccine. You may  need a Td booster every 10 years.  Zoster vaccine. You may need this after age 13.  Pneumococcal 13-valent conjugate (PCV13) vaccine. One dose is recommended after age 29.  Pneumococcal polysaccharide (PPSV23) vaccine. One dose is recommended after age 7. Talk to your health care provider about which screenings and vaccines you need and how often you need them. This information is not intended to replace advice given to you by your health care provider. Make sure you discuss any questions you have with your health care provider. Document Released: 08/11/2015 Document Revised: 04/03/2016 Document Reviewed: 05/16/2015 Elsevier Interactive Patient Education  2017 [employer] Prevention in the Home Falls can cause injuries. They can happen to people of all ages. There are many things you can do to make your home safe and to help prevent falls. What can I do on the outside of my home?  Regularly fix the edges of walkways and driveways and fix any cracks.  Remove anything that might make you trip as you walk through a door, such as a raised step or threshold.  Trim any bushes or trees on the path to your home.  Use bright outdoor lighting.  Clear any walking paths of anything that might make someone trip, such as rocks or tools.  Regularly check to see if handrails are loose or broken. Make sure that both sides of any steps have handrails.  Any raised decks and porches should have guardrails on the edges.  Have any leaves, snow, or ice cleared regularly.  Use sand or salt on walking paths during winter.  Clean up any spills in your garage right away. This includes oil or grease spills. What can I do in the bathroom?  Use night lights.  Install grab bars by the toilet and in the tub and shower. Do not use towel bars as grab bars.  Use non-skid mats or decals in the tub or shower.  If you need to sit down in the shower, use a plastic, non-slip stool.  Keep the floor  dry. Clean up any water that spills on the floor as soon as it happens.  Remove soap buildup in the tub or shower regularly.  Attach bath mats securely with double-sided non-slip rug tape.  Do not have throw rugs and other things on the floor that can make you trip. What can I do in the bedroom?  Use night lights.  Make sure that you have a light by your bed that is easy to reach.  Do not use any sheets or blankets that are too big for your bed. They should not hang down onto the floor.  Have a firm chair that has side arms. You can use this for support while you get dressed.  Do not have throw rugs and other things on the floor that can make you trip. What can I do in the kitchen?  Clean up any spills right away.  Avoid walking on wet floors.  Keep items that you use a lot in easy-to-reach places.  If you need to reach something above you, use a strong step stool that has a grab bar.  Keep electrical cords out of the way.  Do not use floor polish or wax that makes floors slippery. If you must use  wax, use non-skid floor wax.  Do not have throw rugs and other things on the floor that can make you trip. What can I do with my stairs?  Do not leave any items on the stairs.  Make sure that there are handrails on both sides of the stairs and use them. Fix handrails that are broken or loose. Make sure that handrails are as long as the stairways.  Check any carpeting to make sure that it is firmly attached to the stairs. Fix any carpet that is loose or worn.  Avoid having throw rugs at the top or bottom of the stairs. If you do have throw rugs, attach them to the floor with carpet tape.  Make sure that you have a light switch at the top of the stairs and the bottom of the stairs. If you do not have them, ask someone to add them for you. What else can I do to help prevent falls?  Wear shoes that:  Do not have high heels.  Have rubber bottoms.  Are comfortable and fit you  well.  Are closed at the toe. Do not wear sandals.  If you use a stepladder:  Make sure that it is fully opened. Do not climb a closed stepladder.  Make sure that both sides of the stepladder are locked into place.  Ask someone to hold it for you, if possible.  Clearly mark and make sure that you can see:  Any grab bars or handrails.  First and last steps.  Where the edge of each step is.  Use tools that help you move around (mobility aids) if they are needed. These include:  Canes.  Walkers.  Scooters.  Crutches.  Turn on the lights when you go into a dark area. Replace any light bulbs as soon as they burn out.  Set up your furniture so you have a clear path. Avoid moving your furniture around.  If any of your floors are uneven, fix them.  If there are any pets around you, be aware of where they are.  Review your medicines with your doctor. Some medicines can make you feel dizzy. This can increase your chance of falling. Ask your doctor what other things that you can do to help prevent falls. This information is not intended to replace advice given to you by your health care provider. Make sure you discuss any questions you have with your health care provider. Document Released: 05/11/2009 Document Revised: 12/21/2015 Document Reviewed: 08/19/2014 Elsevier Interactive Patient Education  2017 [employer].

## 2020-05-10 NOTE — Progress Notes (Deleted)
Date:  05/10/2020   Name:  Bethany Martinez   DOB:  1945/12/12   MRN:  [id number]   Chief Complaint: No chief complaint on file.  Bethany Martinez is a 74 y.o. female who presents today for her Complete Annual Exam. She feels {DESC; WELL/FAIRLY WELL/POORLY:18703}. She reports exercising ***. She reports she is sleeping {DESC; WELL/FAIRLY WELL/POORLY:18703}. Breast complaints ***.  Mammogram: 05/2019 DEXA: 05/2018 Pap smear: discontinued Colonoscopy: FIT 06/2019   Immunization History  Administered Date(s) Administered  . Fluad Quad(high Dose 65+) 05/10/2019  . Influenza, High Dose Seasonal PF 05/23/2017, 04/17/2018  . Influenza-Unspecified 05/11/2015, 05/23/2017, 06/22/2019  . Moderna SARS-COVID-2 Vaccination 09/20/2019  . Pneumococcal Conjugate-13 06/07/2015  . Pneumococcal Polysaccharide-23 12/01/2012  . Tdap 11/27/2009  . Zoster 01/11/2011  . Zoster Recombinat (Shingrix) 06/22/2019, 01/27/2020    Diabetes She presents for her follow-up diabetic visit. She has type 2 diabetes mellitus. Her disease course has been stable.  Hypertension This is a chronic problem. Past treatments include diuretics, angiotensin blockers and calcium channel blockers. The current treatment provides significant improvement. There are no compliance problems.   Hyperlipidemia This is a chronic problem. The problem is controlled. Current antihyperlipidemic treatment includes statins. The current treatment provides significant improvement of lipids.    Lab Results  Component Value Date   CREATININE 0.91 09/23/2019   BUN 11 09/23/2019   NA 140 09/23/2019   K 3.8 09/23/2019   CL 99 09/23/2019   CO2 25 09/23/2019   Lab Results  Component Value Date   CHOL 217 (H) 05/18/2019   HDL 54 05/18/2019   LDLCALC 141 (H) 05/18/2019   TRIG 122 05/18/2019   CHOLHDL 4.0 05/18/2019   Lab Results  Component Value Date   TSH 2.180 05/18/2019   Lab Results  Component Value Date   HGBA1C  6.0 (H) 09/23/2019   Lab Results  Component Value Date   WBC 6.5 05/18/2019   HGB 13.4 05/18/2019   HCT 40.2 05/18/2019   MCV 82 05/18/2019   PLT 335 05/18/2019   Lab Results  Component Value Date   ALT 29 05/18/2019   AST 23 05/18/2019   ALKPHOS 91 05/18/2019   BILITOT 0.7 05/18/2019     Review of Systems  Patient Active Problem List   Diagnosis Date Noted  . Hyperlipidemia associated with type 2 diabetes mellitus ([place]) 05/18/2019  . Eczema 07/18/2017  . Type II diabetes mellitus with complication ([place]) 62/83/1517  . Edema extremities 01/11/2015  . Allergic state 01/11/2015  . Essential (primary) hypertension 01/11/2015    No Known Allergies  Past Surgical History:  Procedure Laterality Date  . ABDOMINAL HYSTERECTOMY  1988   partial  . COLONOSCOPY  01/2009   Dr. Jamal Collin    Social History   Tobacco Use  . Smoking status: Never Smoker  . Smokeless tobacco: Never Used  . Tobacco comment: smoking cessation materials not required  Vaping Use  . Vaping Use: Never used  Substance Use Topics  . Alcohol use: No    Alcohol/week: 0.0 standard drinks  . Drug use: No     Medication list has been reviewed and updated.  No outpatient medications have been marked as taking for the 05/10/20 encounter (Appointment) with Glean Hess, MD.    [facility] 2/9 Scores 09/23/2019 05/10/2019 04/17/2018 01/19/2018  PHQ - 2 Score 0 0 0 0  PHQ- 9 Score 4 - - 0    No flowsheet data found.  BP Readings from Last 3 Encounters:  09/23/19 132/78  05/18/19 132/80  05/10/19 (!) 142/86    Physical Exam  Wt Readings from Last 3 Encounters:  09/23/19 157 lb (71.2 kg)  05/18/19 150 lb 6.4 oz (68.2 kg)  05/10/19 153 lb 12.8 oz (69.8 kg)    There were no vitals taken for this visit.  Assessment and Plan:

## 2020-05-10 NOTE — Progress Notes (Signed)
Subjective:   Bethany Martinez is a 74 y.o. female who presents for Medicare Annual (Subsequent) preventive examination.  Review of Systems     Cardiac Risk Factors include: advanced age (>12men, >8 women);diabetes mellitus;dyslipidemia;hypertension     Objective:    Today's Vitals   05/10/20 1053  BP: 124/82  Pulse: 84  Resp: 16  Temp: 98.1 F (36.7 C)  TempSrc: Oral  SpO2: 98%  Weight: 155 lb 9.6 oz (70.6 kg)  Height: 5\' 1"  (1.549 m)   Body mass index is 29.4 kg/m.  Advanced Directives 05/10/2020 05/10/2019 01/19/2018 12/05/2015  Does Patient Have a Medical Advance Directive? Yes No Yes Yes  Type of Paramedic of [place];Living will - [place];Living will -  Copy of [place] in Chart? No - copy requested - No - copy requested No - copy requested  Would patient like information on creating a medical advance directive? - No - Patient declined - -    Current Medications (verified) Outpatient Encounter Medications as of 05/10/2020  Medication Sig  . amLODipine (NORVASC) 5 MG tablet Take 1 tablet (5 mg total) by mouth daily.  Marland Kitchen aspirin EC 81 MG tablet Take 81 mg by mouth daily. Patient taking 3 times per week  . atorvastatin (LIPITOR) 10 MG tablet Take 1 tablet (10 mg total) by mouth 3 (three) times a week.  . Calcium-Phosphorus-Vitamin D (CALCIUM/D3 ADULT GUMMIES PO) Take by mouth.  . hydrochlorothiazide (HYDRODIURIL) 25 MG tablet TAKE 1 TABLET BY MOUTH EVERY DAY  . irbesartan (AVAPRO) 150 MG tablet TAKE 1 TABLET BY MOUTH EVERY DAY  . latanoprost (XALATAN) 0.005 % ophthalmic solution 1 drop at bedtime.  . triamcinolone ointment (KENALOG) 0.1 % Apply 1 application topically 2 (two) times daily. To knees and elbows  . TURMERIC CURCUMIN PO Take by mouth.  . [DISCONTINUED] azelastine (OPTIVAR) 0.05 % ophthalmic solution Place 1 drop into both eyes 2 (two) times daily. (Patient not taking: Reported on  05/10/2020)   No facility-administered encounter medications on file as of 05/10/2020.    Allergies (verified) Patient has no known allergies.   History: Past Medical History:  Diagnosis Date  . Allergy   . Diabetes mellitus without complication ([place])   . Encounter for dental exam and cleaning w/o abnormal findings 05/18/2018  . Glaucoma   . Hyperlipidemia   . Hypertension    Past Surgical History:  Procedure Laterality Date  . ABDOMINAL HYSTERECTOMY  1988   partial  . COLONOSCOPY  01/2009   Dr. Jamal Collin   Family History  Problem Relation Age of Onset  . Breast cancer Sister 67  . Cancer Sister        colon  . Dementia Sister   . Breast cancer Paternal Aunt 35  . Hypertension Mother   . Hypertension Father   . Heart disease Brother        CABG   Social History   Socioeconomic History  . Marital status: Divorced    Spouse name: Not on file  . Number of children: 2  . Years of education: some college  . Highest education level: 12th grade  Occupational History  . Occupation: Retired  Tobacco Use  . Smoking status: Never Smoker  . Smokeless tobacco: Never Used  . Tobacco comment: smoking cessation materials not required  Vaping Use  . Vaping Use: Never used  Substance and Sexual Activity  . Alcohol use: No    Alcohol/week: 0.0 standard drinks  .  Drug use: No  . Sexual activity: Not Currently  Other Topics Concern  . Not on file  Social History Narrative   Pt lives alone. Sister with dementia passed away 2020/02/26.    Social Determinants of Health   Financial Resource Strain: Low Risk   . Difficulty of Paying Living Expenses: Not hard at all  Food Insecurity: No Food Insecurity  . Worried About Charity fundraiser in the Last Year: Never true  . Ran Out of Food in the Last Year: Never true  Transportation Needs: No Transportation Needs  . Lack of Transportation (Medical): No  . Lack of Transportation (Non-Medical): No  Physical Activity: Inactive  .  Days of Exercise per Week: 0 days  . Minutes of Exercise per Session: 0 min  Stress: No Stress Concern Present  . Feeling of Stress : Not at all  Social Connections: Moderately Isolated  . Frequency of Communication with Friends and Family: More than three times a week  . Frequency of Social Gatherings with Friends and Family: Once a week  . Attends Religious Services: More than 4 times per year  . Active Member of Clubs or Organizations: No  . Attends Archivist Meetings: Never  . Marital Status: Divorced    Tobacco Counseling Counseling given: Not Answered Comment: smoking cessation materials not required   Clinical Intake:  Pre-visit preparation completed: Yes  Pain : No/denies pain     BMI - recorded: 29.4 Nutritional Status: BMI 25 -29 Overweight Nutritional Risks: None Diabetes: Yes CBG done?: No Did pt. bring in CBG monitor from home?: No  How often do you need to have someone help you when you read instructions, pamphlets, or other written materials from your doctor or pharmacy?: 1 - Never  Nutrition Risk Assessment:  Has the patient had any N/V/D within the last 2 months?  No  Does the patient have any non-healing wounds?  No  Has the patient had any unintentional weight loss or weight gain?  No   Diabetes:  Is the patient diabetic?  Yes  If diabetic, was a CBG obtained today?  No  Did the patient bring in their glucometer from home?  No  How often do you monitor your CBG's? Pt does not actively check blood sugar.   Financial Strains and Diabetes Management:  Are you having any financial strains with the device, your supplies or your medication? No .  Does the patient want to be seen by Chronic Care Management for management of their diabetes?  No  Would the patient like to be referred to a Nutritionist or for Diabetic Management?  No   Diabetic Exams:  Diabetic Eye Exam: Completed per patient; need to request records from [facility].     Diabetic Foot Exam: Completed 05/18/19.    Interpreter Needed?: No  Information entered by :: Clemetine Marker LPN   Activities of Daily Living In your present state of health, do you have any difficulty performing the following activities: 05/10/2020  Hearing? N  Comment declines hearing aids  Vision? N  Difficulty concentrating or making decisions? N  Walking or climbing stairs? N  Dressing or bathing? N  Doing errands, shopping? N  Preparing Food and eating ? N  Using the Toilet? N  In the past six months, have you accidently leaked urine? N  Do you have problems with loss of bowel control? N  Managing your Medications? N  Managing your Finances? N  Housekeeping or managing  your Housekeeping? N  Some recent data might be hidden    Patient Care Team: Glean Hess, MD as PCP - General (Internal Medicine) Eulogio Bear, MD as Consulting Physician (Ophthalmology)  Indicate any recent Medical Services you may have received from other than [facility] providers in the past year (date may be approximate).     Assessment:   This is a routine wellness examination for [place].  Hearing/Vision screen  Hearing Screening   125Hz  250Hz  500Hz  1000Hz  2000Hz  3000Hz  4000Hz  6000Hz  8000Hz   Right ear:           Left ear:           Comments: Pt denies hearing difficulty  Vision Screening Comments: Annual vision screenings at [place] issues and exercise activities discussed: Current Exercise Habits: Home exercise routine, Type of exercise: walking, Intensity: Mild, Exercise limited by: None identified  Goals    . DIET - INCREASE WATER INTAKE     Recommend to drink at least 6-8 8oz glasses of water per day.    . Increase physical activity     Recommend increasing physical activity to 150 minutes per week      Depression Screen PHQ 2/9 Scores 05/10/2020 09/23/2019 05/10/2019 04/17/2018 01/19/2018 01/16/2017 01/16/2017  PHQ - 2 Score 0 0 0 0 0 0 0  PHQ- 9 Score - 4  - - 0 - -    Fall Risk Fall Risk  05/10/2020 09/23/2019 05/10/2019 01/19/2018 01/16/2017  Falls in the past year? 0 0 0 No No  Number falls in past yr: 0 0 0 - -  Injury with Fall? 0 0 0 - -  Risk for fall due to : No Fall Risks - - Impaired vision -  Risk for fall due to: Comment - - - wears eyeglasses, glaucoma -  Follow up Falls prevention discussed Falls evaluation completed Falls prevention discussed - -    Any stairs in or around the home? Yes  If so, are there any without handrails? No  Home free of loose throw rugs in walkways, pet beds, electrical cords, etc? Yes  Adequate lighting in your home to reduce risk of falls? Yes   ASSISTIVE DEVICES UTILIZED TO PREVENT FALLS:  Life alert? No  Use of a cane, walker or w/c? No  Grab bars in the bathroom? Yes  Shower chair or bench in shower? Yes  Elevated toilet seat or a handicapped toilet? Yes   TIMED UP AND GO:  Was the test performed? Yes .  Length of time to ambulate 10 feet: 5 sec.   Gait steady and fast without use of assistive device  Cognitive Function:     6CIT Screen 05/10/2020 05/10/2019 01/19/2018 01/16/2017  What Year? 0 points 0 points 0 points 0 points  What month? 0 points 0 points 0 points 0 points  What time? 0 points 0 points 0 points 0 points  Count back from 20 0 points 0 points 0 points 0 points  Months in reverse 4 points 2 points 0 points 0 points  Repeat phrase 0 points 4 points 2 points 0 points  Total Score 4 6 2  0    Immunizations Immunization History  Administered Date(s) Administered  . Fluad Quad(high Dose 65+) 05/10/2019, 05/10/2020  . Influenza, High Dose Seasonal PF 05/23/2017, 04/17/2018  . Influenza-Unspecified 05/11/2015, 05/23/2017, 06/22/2019  . Moderna SARS-COVID-2 Vaccination 09/20/2019, 10/18/2019  . Pneumococcal Conjugate-13 06/07/2015  . Pneumococcal Polysaccharide-23 12/01/2012  . Tdap 11/27/2009  .  Zoster 01/11/2011  . Zoster Recombinat (Shingrix) 06/22/2019, 01/27/2020     TDAP status: Due, Education has been provided regarding the importance of this vaccine. Advised may receive this vaccine at local pharmacy or Health Dept. Aware to provide a copy of the vaccination record if obtained from local pharmacy or Health Dept. Verbalized acceptance and understanding.   Flu Vaccine status: Completed at today's visit   Pneumococcal vaccine status: Up to date   Covid-19 vaccine status: Completed vaccines  Qualifies for Shingles Vaccine? Yes   Zostavax completed Yes   Shingrix Completed?: Yes  Screening Tests Health Maintenance  Topic Date Due  . HEMOGLOBIN A1C  03/22/2020  . OPHTHALMOLOGY EXAM  04/28/2020  . TETANUS/TDAP  07/29/2020 (Originally 11/28/2019)  . FOOT EXAM  05/17/2020  . MAMMOGRAM  06/02/2020  . COLON CANCER SCREENING ANNUAL FOBT  07/18/2020  . INFLUENZA VACCINE  Completed  . DEXA SCAN  Completed  . COVID-19 Vaccine  Completed  . Hepatitis C Screening  Completed  . PNA vac Low Risk Adult  Completed  . COLONOSCOPY  Discontinued    Health Maintenance  Health Maintenance Due  Topic Date Due  . HEMOGLOBIN A1C  03/22/2020  . OPHTHALMOLOGY EXAM  04/28/2020   Colorectal cancer screening status: declined colonoscopy; plans for FOBT yearly  Mammogram status: Completed 06/03/19. Repeat every year   Bone Density status: Completed 06/01/18. Results reflect: Bone density results: OSTEOPOROSIS. Repeat every 2 years.  Lung Cancer Screening: (Low Dose CT Chest recommended if Age 75-80 years, 30 pack-year currently smoking OR have quit w/in 15years.) does not qualify.   Additional Screening:  Hepatitis C Screening: does qualify; Completed 12/05/15  Vision Screening: Recommended annual ophthalmology exams for early detection of glaucoma and other disorders of the eye. Is the patient up to date with their annual eye exam?  Yes  Who is the provider or what is the name of the office in which the patient attends annual eye exams? [place] Screening: Recommended annual dental exams for proper oral hygiene  Community Resource Referral / Chronic Care Management: CRR required this visit?  No   CCM required this visit?  No      Plan:     I have personally reviewed and noted the following in the patient's chart:   . Medical and social history . Use of alcohol, tobacco or illicit drugs  . Current medications and supplements . Functional ability and status . Nutritional status . Physical activity . Advanced directives . List of other physicians . Hospitalizations, surgeries, and ER visits in previous 12 months . Vitals . Screenings to include cognitive, depression, and falls . Referrals and appointments  In addition, I have reviewed and discussed with patient certain preventive protocols, quality metrics, and best practice recommendations. A written personalized care plan for preventive services as well as general preventive health recommendations were provided to patient.     Clemetine Marker, LPN   [id number]   Nurse Notes: none

## 2020-05-18 ENCOUNTER — Encounter: Payer: Self-pay | Admitting: Internal Medicine

## 2020-05-18 ENCOUNTER — Ambulatory Visit (INDEPENDENT_AMBULATORY_CARE_PROVIDER_SITE_OTHER): Payer: Medicare Other | Admitting: Internal Medicine

## 2020-05-18 ENCOUNTER — Other Ambulatory Visit: Payer: Self-pay

## 2020-05-18 VITALS — BP 138/70 | HR 70 | Temp 97.9°F | Ht 61.0 in | Wt 154.6 lb

## 2020-05-18 DIAGNOSIS — I10 Essential (primary) hypertension: Secondary | ICD-10-CM

## 2020-05-18 DIAGNOSIS — Z1211 Encounter for screening for malignant neoplasm of colon: Secondary | ICD-10-CM

## 2020-05-18 DIAGNOSIS — E782 Mixed hyperlipidemia: Secondary | ICD-10-CM | POA: Diagnosis not present

## 2020-05-18 DIAGNOSIS — R7303 Prediabetes: Secondary | ICD-10-CM | POA: Diagnosis not present

## 2020-05-18 DIAGNOSIS — Z1231 Encounter for screening mammogram for malignant neoplasm of breast: Secondary | ICD-10-CM

## 2020-05-18 DIAGNOSIS — M818 Other osteoporosis without current pathological fracture: Secondary | ICD-10-CM | POA: Diagnosis not present

## 2020-05-18 DIAGNOSIS — Z0000 Encounter for general adult medical examination without abnormal findings: Secondary | ICD-10-CM

## 2020-05-18 LAB — POCT URINALYSIS DIPSTICK
Bilirubin, UA: NEGATIVE
Blood, UA: NEGATIVE
Glucose, UA: NEGATIVE
Ketones, UA: NEGATIVE
Nitrite, UA: NEGATIVE
Protein, UA: NEGATIVE
Spec Grav, UA: 1.01 (ref 1.010–1.025)
Urobilinogen, UA: 0.2 E.U./dL
pH, UA: 6.5 (ref 5.0–8.0)

## 2020-05-18 MED ORDER — LOSARTAN POTASSIUM 100 MG PO TABS: 100.0000 mg | ORAL_TABLET | Freq: Every day | ORAL | 3 refills | Status: DC

## 2020-05-18 MED ORDER — HYDROCHLOROTHIAZIDE 25 MG PO TABS: 25.0000 mg | ORAL_TABLET | Freq: Every day | ORAL | 1 refills | Status: DC

## 2020-05-18 MED ORDER — ATORVASTATIN CALCIUM 10 MG PO TABS: 10.0000 mg | ORAL_TABLET | ORAL | 3 refills | Status: DC

## 2020-05-18 NOTE — Progress Notes (Signed)
Date:  05/18/2020   Name:  Bethany Martinez   DOB:  April 28, 1946   MRN:  [id number]   Chief Complaint: Annual Exam (Breast Exam. No pap- discontinued.)  Bethany Martinez is a 74 y.o. female who presents today for her Complete Annual Exam. She feels well. She reports exercising - walking 3 to 4 times weekly. She reports she is sleeping well. Breast complaints - none.  Mammogram: 05/2019 - scheduled for 06/13/20 DEXA: 05/2018 - osteoporosis - scheduled 06/13/20 Pap smear: discontinued Colonoscopy: FIT 06/2019  Immunization History  Administered Date(s) Administered  . Fluad Quad(high Dose 65+) 05/10/2019, 05/10/2020  . Influenza, High Dose Seasonal PF 05/23/2017, 04/17/2018  . Influenza-Unspecified 05/11/2015, 05/23/2017, 06/22/2019  . Moderna SARS-COVID-2 Vaccination 09/20/2019, 10/18/2019  . Pneumococcal Conjugate-13 06/07/2015  . Pneumococcal Polysaccharide-23 12/01/2012  . Tdap 11/27/2009  . Zoster 01/11/2011  . Zoster Recombinat (Shingrix) 06/22/2019, 01/27/2020    Diabetes She presents for her follow-up diabetic visit. Diabetes type: prediabetes. Her disease course has been stable. Pertinent negatives for hypoglycemia include no dizziness, headaches, nervousness/anxiousness or tremors. Pertinent negatives for diabetes include no chest pain, no fatigue, no polydipsia and no polyuria. Current diabetic treatment includes diet. She is compliant with treatment most of the time. Her weight is stable. An ACE inhibitor/angiotensin II receptor blocker is being taken.  Hypertension This is a chronic problem. The problem is controlled. Pertinent negatives include no chest pain, headaches, palpitations or shortness of breath. Past treatments include angiotensin blockers, beta blockers and diuretics. The current treatment provides significant improvement.  Hyperlipidemia This is a chronic problem. The problem is controlled. Pertinent negatives include no chest pain or shortness of  breath. Current antihyperlipidemic treatment includes statins. The current treatment provides significant improvement of lipids.    Lab Results  Component Value Date   CREATININE 0.91 09/23/2019   BUN 11 09/23/2019   NA 140 09/23/2019   K 3.8 09/23/2019   CL 99 09/23/2019   CO2 25 09/23/2019   Lab Results  Component Value Date   CHOL 217 (H) 05/18/2019   HDL 54 05/18/2019   LDLCALC 141 (H) 05/18/2019   TRIG 122 05/18/2019   CHOLHDL 4.0 05/18/2019   Lab Results  Component Value Date   TSH 2.180 05/18/2019   Lab Results  Component Value Date   HGBA1C 6.0 (H) 09/23/2019   Lab Results  Component Value Date   WBC 6.5 05/18/2019   HGB 13.4 05/18/2019   HCT 40.2 05/18/2019   MCV 82 05/18/2019   PLT 335 05/18/2019   Lab Results  Component Value Date   ALT 29 05/18/2019   AST 23 05/18/2019   ALKPHOS 91 05/18/2019   BILITOT 0.7 05/18/2019     Review of Systems  Constitutional: Negative for chills, fatigue and fever.  HENT: Negative for congestion, hearing loss, tinnitus, trouble swallowing and voice change.   Eyes: Negative for visual disturbance.  Respiratory: Negative for cough, chest tightness, shortness of breath and wheezing.   Cardiovascular: Negative for chest pain, palpitations and leg swelling.  Gastrointestinal: Negative for abdominal pain, constipation, diarrhea and vomiting.  Endocrine: Negative for polydipsia and polyuria.  Genitourinary: Negative for dysuria, frequency, genital sores, vaginal bleeding and vaginal discharge.  Musculoskeletal: Negative for arthralgias, gait problem and joint swelling.  Skin: Negative for color change and rash.  Neurological: Negative for dizziness, tremors, light-headedness and headaches.  Hematological: Negative for adenopathy. Does not bruise/bleed easily.  Psychiatric/Behavioral: Negative for dysphoric mood and sleep disturbance. The patient is not  nervous/anxious.     Patient Active Problem List   Diagnosis Date  Noted  . Age-related osteoporosis without fracture 05/18/2020  . Mixed hyperlipidemia 05/18/2019  . Eczema 07/18/2017  . Prediabetes 06/07/2015  . Edema extremities 01/11/2015  . Allergic state 01/11/2015  . Essential (primary) hypertension 01/11/2015    No Known Allergies  Past Surgical History:  Procedure Laterality Date  . ABDOMINAL HYSTERECTOMY  1988   partial  . COLONOSCOPY  01/2009   Dr. Jamal Collin    Social History   Tobacco Use  . Smoking status: Never Smoker  . Smokeless tobacco: Never Used  . Tobacco comment: smoking cessation materials not required  Vaping Use  . Vaping Use: Never used  Substance Use Topics  . Alcohol use: No    Alcohol/week: 0.0 standard drinks  . Drug use: No     Medication list has been reviewed and updated.  Current Meds  Medication Sig  . amLODipine (NORVASC) 5 MG tablet Take 1 tablet (5 mg total) by mouth daily.  Marland Kitchen aspirin EC 81 MG tablet Take 81 mg by mouth daily. Patient taking 3 times per week  . atorvastatin (LIPITOR) 10 MG tablet Take 1 tablet (10 mg total) by mouth 3 (three) times a week.  . Calcium-Phosphorus-Vitamin D (CALCIUM/D3 ADULT GUMMIES PO) Take by mouth.  . hydrochlorothiazide (HYDRODIURIL) 25 MG tablet TAKE 1 TABLET BY MOUTH EVERY DAY  . irbesartan (AVAPRO) 150 MG tablet TAKE 1 TABLET BY MOUTH EVERY DAY  . latanoprost (XALATAN) 0.005 % ophthalmic solution 1 drop at bedtime.  . triamcinolone ointment (KENALOG) 0.1 % Apply 1 application topically 2 (two) times daily. To knees and elbows  . TURMERIC CURCUMIN PO Take by mouth.    PHQ 2/9 Scores 05/18/2020 05/10/2020 09/23/2019 05/10/2019  PHQ - 2 Score 0 0 0 0  PHQ- 9 Score 0 - 4 -    GAD 7 : Generalized Anxiety Score 05/18/2020  Nervous, Anxious, on Edge 0  Control/stop worrying 0  Worry too much - different things 0  Trouble relaxing 0  Restless 0  Easily annoyed or irritable 0  Afraid - awful might happen 0  Total GAD 7 Score 0  Anxiety Difficulty Not  difficult at all    BP Readings from Last 3 Encounters:  05/18/20 138/70  05/10/20 124/82  09/23/19 132/78    Physical Exam Vitals and nursing note reviewed.  Constitutional:      General: She is not in acute distress.    Appearance: She is well-developed.  HENT:     Head: Normocephalic and atraumatic.     Right Ear: Tympanic membrane and ear canal normal.     Left Ear: Tympanic membrane and ear canal normal.     Nose:     Right Sinus: No maxillary sinus tenderness.     Left Sinus: No maxillary sinus tenderness.  Eyes:     General: No scleral icterus.       Right eye: No discharge.        Left eye: No discharge.     Conjunctiva/sclera: Conjunctivae normal.  Neck:     Thyroid: No thyromegaly.     Vascular: No carotid bruit.  Cardiovascular:     Rate and Rhythm: Normal rate and regular rhythm.     Pulses: Normal pulses.     Heart sounds: Normal heart sounds.  Pulmonary:     Effort: Pulmonary effort is normal. No respiratory distress.     Breath sounds: No wheezing.  Chest:  Breasts:        Right: No mass, nipple discharge, skin change or tenderness.        Left: No mass, nipple discharge, skin change or tenderness.  Abdominal:     General: Bowel sounds are normal.     Palpations: Abdomen is soft.     Tenderness: There is no abdominal tenderness.  Musculoskeletal:     Cervical back: Normal range of motion. No erythema.     Right lower leg: No edema.     Left lower leg: No edema.  Lymphadenopathy:     Cervical: No cervical adenopathy.  Skin:    General: Skin is warm and dry.     Findings: No rash.  Neurological:     Mental Status: She is alert and oriented to person, place, and time.     Cranial Nerves: No cranial nerve deficit.     Sensory: No sensory deficit.     Deep Tendon Reflexes: Reflexes are normal and symmetric.  Psychiatric:        Attention and Perception: Attention normal.        Mood and Affect: Mood normal.     Wt Readings from Last 3  Encounters:  05/18/20 154 lb 9.6 oz (70.1 kg)  05/10/20 155 lb 9.6 oz (70.6 kg)  09/23/19 157 lb (71.2 kg)    BP 138/70   Pulse 70   Temp 97.9 F (36.6 C) (Oral)   Ht 5\' 1"  (1.549 m)   Wt 154 lb 9.6 oz (70.1 kg)   SpO2 97%   BMI 29.21 kg/m   Assessment and Plan: 1. Annual physical exam Normal exam Continue healthy diet, regular exercise - POCT urinalysis dipstick  2. Encounter for screening mammogram for breast cancer scheduled  3. Colon cancer screening Due for yearly FIT - Fecal occult blood, imunochemical  4. Essential (primary) hypertension Clinically stable exam with well controlled BP but with diarrhea to avapro. Tolerating medications without side effects at this time. Will stop avapro and begin losartan Pt to continue  low sodium diet; benefits of regular exercise as able discussed. - CBC with Differential/Platelet - Comprehensive metabolic panel - TSH - hydrochlorothiazide (HYDRODIURIL) 25 MG tablet; Take 1 tablet (25 mg total) by mouth daily.  Dispense: 90 tablet; Refill: 1 - losartan (COZAAR) 100 MG tablet; Take 1 tablet (100 mg total) by mouth daily.  Dispense: 90 tablet; Refill: 3  5. Mixed hyperlipidemia Cholesterol well controlled on three times a week statin without side effects - Lipid panel - atorvastatin (LIPITOR) 10 MG tablet; Take 1 tablet (10 mg total) by mouth 3 (three) times a week.  Dispense: 45 tablet; Refill: 3  6. Prediabetes Continue healthy diet and weight loss - Hemoglobin A1c  7. Age-related osteoporosis without fracture Check levels and advise - VITAMIN D 25 Hydroxy (Vit-D Deficiency, Fractures)     Partially dictated using Editor, commissioning. Any errors are unintentional.  Halina Maidens, MD [facility]  05/18/2020

## 2020-05-19 LAB — COMPREHENSIVE METABOLIC PANEL
ALT: 18 IU/L (ref 0–32)
AST: 21 IU/L (ref 0–40)
Albumin/Globulin Ratio: 1.7 (ref 1.2–2.2)
Albumin: 4.8 g/dL — ABNORMAL HIGH (ref 3.7–4.7)
Alkaline Phosphatase: 93 IU/L (ref 44–121)
BUN/Creatinine Ratio: 15 (ref 12–28)
BUN: 13 mg/dL (ref 8–27)
Bilirubin Total: 0.8 mg/dL (ref 0.0–1.2)
CO2: 23 mmol/L (ref 20–29)
Calcium: 9.7 mg/dL (ref 8.7–10.3)
Chloride: 94 mmol/L — ABNORMAL LOW (ref 96–106)
Creatinine, Ser: 0.86 mg/dL (ref 0.57–1.00)
GFR calc Af Amer: 78 mL/min/{1.73_m2} (ref >59)
GFR calc non Af Amer: 67 mL/min/{1.73_m2} (ref >59)
Globulin, Total: 2.8 g/dL (ref 1.5–4.5)
Glucose: 109 mg/dL — ABNORMAL HIGH (ref 65–99)
Potassium: 4.1 mmol/L (ref 3.5–5.2)
Sodium: 136 mmol/L (ref 134–144)
Total Protein: 7.6 g/dL (ref 6.0–8.5)

## 2020-05-19 LAB — HEMOGLOBIN A1C
Est. average glucose Bld gHb Est-mCnc: 128 mg/dL
Hgb A1c MFr Bld: 6.1 % — ABNORMAL HIGH (ref 4.8–5.6)

## 2020-05-19 LAB — CBC WITH DIFFERENTIAL/PLATELET
Basophils Absolute: 0 10*3/uL (ref 0.0–0.2)
Basos: 0 %
EOS (ABSOLUTE): 0.1 10*3/uL (ref 0.0–0.4)
Eos: 1 %
Hematocrit: 41.2 % (ref 34.0–46.6)
Hemoglobin: 13.5 g/dL (ref 11.1–15.9)
Immature Grans (Abs): 0 10*3/uL (ref 0.0–0.1)
Immature Granulocytes: 0 %
Lymphocytes Absolute: 1.6 10*3/uL (ref 0.7–3.1)
Lymphs: 26 %
MCH: 28.1 pg (ref 26.6–33.0)
MCHC: 32.8 g/dL (ref 31.5–35.7)
MCV: 86 fL (ref 79–97)
Monocytes Absolute: 0.5 10*3/uL (ref 0.1–0.9)
Monocytes: 8 %
Neutrophils Absolute: 4.2 10*3/uL (ref 1.4–7.0)
Neutrophils: 65 %
Platelets: 318 10*3/uL (ref 150–450)
RBC: 4.8 x10E6/uL (ref 3.77–5.28)
RDW: 14.5 % (ref 11.7–15.4)
WBC: 6.4 10*3/uL (ref 3.4–10.8)

## 2020-05-19 LAB — LIPID PANEL
Chol/HDL Ratio: 4.6 ratio — ABNORMAL HIGH (ref 0.0–4.4)
Cholesterol, Total: 234 mg/dL — ABNORMAL HIGH (ref 100–199)
HDL: 51 mg/dL (ref >39)
LDL Chol Calc (NIH): 157 mg/dL — ABNORMAL HIGH (ref 0–99)
Triglycerides: 144 mg/dL (ref 0–149)
VLDL Cholesterol Cal: 26 mg/dL (ref 5–40)

## 2020-05-19 LAB — VITAMIN D 25 HYDROXY (VIT D DEFICIENCY, FRACTURES): Vit D, 25-Hydroxy: 23 ng/mL — ABNORMAL LOW (ref 30.0–100.0)

## 2020-05-19 LAB — TSH: TSH: 1.69 u[IU]/mL (ref 0.450–4.500)

## 2020-06-06 ENCOUNTER — Other Ambulatory Visit: Payer: Medicare Other

## 2020-06-06 ENCOUNTER — Ambulatory Visit: Payer: Medicare Other

## 2020-06-13 ENCOUNTER — Other Ambulatory Visit: Payer: Self-pay

## 2020-06-13 ENCOUNTER — Ambulatory Visit
Admission: RE | Admit: 2020-06-13 | Discharge: 2020-06-13 | Disposition: A | Discharge status: Home / Self Care | Payer: Medicare Other | Source: Ambulatory Visit | Attending: Internal Medicine | Admitting: Internal Medicine

## 2020-06-13 DIAGNOSIS — Z1231 Encounter for screening mammogram for malignant neoplasm of breast: Secondary | ICD-10-CM | POA: Diagnosis not present

## 2020-06-13 DIAGNOSIS — Z780 Asymptomatic menopausal state: Secondary | ICD-10-CM | POA: Insufficient documentation

## 2020-06-13 DIAGNOSIS — M85852 Other specified disorders of bone density and structure, left thigh: Secondary | ICD-10-CM | POA: Diagnosis not present

## 2020-06-26 DIAGNOSIS — Z1211 Encounter for screening for malignant neoplasm of colon: Secondary | ICD-10-CM | POA: Diagnosis not present

## 2020-07-05 LAB — FECAL OCCULT BLOOD, IMMUNOCHEMICAL: Fecal Occult Bld: NEGATIVE

## 2020-08-15 ENCOUNTER — Other Ambulatory Visit: Payer: Self-pay | Admitting: Internal Medicine

## 2020-08-15 DIAGNOSIS — I10 Essential (primary) hypertension: Secondary | ICD-10-CM

## 2020-10-09 DIAGNOSIS — H401131 Primary open-angle glaucoma, bilateral, mild stage: Secondary | ICD-10-CM | POA: Diagnosis not present

## 2020-10-09 DIAGNOSIS — H1045 Other chronic allergic conjunctivitis: Secondary | ICD-10-CM | POA: Diagnosis not present

## 2020-10-09 DIAGNOSIS — H2513 Age-related nuclear cataract, bilateral: Secondary | ICD-10-CM | POA: Diagnosis not present

## 2020-10-09 DIAGNOSIS — H40013 Open angle with borderline findings, low risk, bilateral: Secondary | ICD-10-CM | POA: Diagnosis not present

## 2020-11-21 ENCOUNTER — Other Ambulatory Visit: Payer: Self-pay

## 2020-11-21 ENCOUNTER — Ambulatory Visit (INDEPENDENT_AMBULATORY_CARE_PROVIDER_SITE_OTHER): Payer: Medicare Other | Admitting: Internal Medicine

## 2020-11-21 ENCOUNTER — Encounter: Payer: Self-pay | Admitting: Internal Medicine

## 2020-11-21 VITALS — BP 128/83 | HR 96 | Temp 98.5°F | Ht 61.0 in | Wt 158.0 lb

## 2020-11-21 DIAGNOSIS — I10 Essential (primary) hypertension: Secondary | ICD-10-CM | POA: Diagnosis not present

## 2020-11-21 DIAGNOSIS — E782 Mixed hyperlipidemia: Secondary | ICD-10-CM | POA: Diagnosis not present

## 2020-11-21 DIAGNOSIS — R7303 Prediabetes: Secondary | ICD-10-CM

## 2020-11-21 MED ORDER — ROSUVASTATIN CALCIUM 5 MG PO TABS: 5.0000 mg | ORAL_TABLET | ORAL | 1 refills | Status: DC

## 2020-11-21 MED ORDER — HYDROCHLOROTHIAZIDE 25 MG PO TABS: 25.0000 mg | ORAL_TABLET | Freq: Every day | ORAL | 1 refills | Status: DC

## 2020-11-21 NOTE — Progress Notes (Signed)
Date:  11/21/2020   Name:  Bethany Martinez   DOB:  12-Nov-1945   MRN:  [id number]   Chief Complaint: Hypertension and Hyperlipidemia (Has not been talking atorvastatin for 3 weeks due to shoulder pain )  Hypertension This is a chronic problem. The problem is controlled (127/70  at home). Pertinent negatives include no chest pain, headaches, palpitations or shortness of breath. Past treatments include calcium channel blockers, diuretics and angiotensin blockers. The current treatment provides significant improvement.  Hyperlipidemia This is a chronic problem. The problem is uncontrolled. Pertinent negatives include no chest pain or shortness of breath. Current antihyperlipidemic treatment includes statins. The current treatment provides moderate improvement of lipids. Compliance problems include medication side effects.     Lab Results  Component Value Date   CREATININE 0.86 05/18/2020   BUN 13 05/18/2020   NA 136 05/18/2020   K 4.1 05/18/2020   CL 94 (L) 05/18/2020   CO2 23 05/18/2020   Lab Results  Component Value Date   CHOL 234 (H) 05/18/2020   HDL 51 05/18/2020   LDLCALC 157 (H) 05/18/2020   TRIG 144 05/18/2020   CHOLHDL 4.6 (H) 05/18/2020   Lab Results  Component Value Date   TSH 1.690 05/18/2020   Lab Results  Component Value Date   HGBA1C 6.1 (H) 05/18/2020   Lab Results  Component Value Date   WBC 6.4 05/18/2020   HGB 13.5 05/18/2020   HCT 41.2 05/18/2020   MCV 86 05/18/2020   PLT 318 05/18/2020   Lab Results  Component Value Date   ALT 18 05/18/2020   AST 21 05/18/2020   ALKPHOS 93 05/18/2020   BILITOT 0.8 05/18/2020     Review of Systems  Constitutional: Negative for fatigue and unexpected weight change.  HENT: Negative for nosebleeds.   Eyes: Negative for visual disturbance.  Respiratory: Negative for cough, chest tightness, shortness of breath and wheezing.   Cardiovascular: Negative for chest pain, palpitations and leg swelling.   Gastrointestinal: Negative for abdominal pain, constipation and diarrhea.  Musculoskeletal: Positive for arthralgias (shoulder pain when taking atorvastatin even every other day). Negative for gait problem and joint swelling.  Neurological: Negative for dizziness, weakness, light-headedness and headaches.    Patient Active Problem List   Diagnosis Date Noted  . Age-related osteoporosis without fracture 05/18/2020  . Mixed hyperlipidemia 05/18/2019  . Eczema 07/18/2017  . Prediabetes 06/07/2015  . Edema extremities 01/11/2015  . Allergic state 01/11/2015  . Essential (primary) hypertension 01/11/2015    No Known Allergies  Past Surgical History:  Procedure Laterality Date  . ABDOMINAL HYSTERECTOMY  1988   partial  . COLONOSCOPY  01/2009   Dr. Jamal Collin    Social History   Tobacco Use  . Smoking status: Never Smoker  . Smokeless tobacco: Never Used  . Tobacco comment: smoking cessation materials not required  Vaping Use  . Vaping Use: Never used  Substance Use Topics  . Alcohol use: No    Alcohol/week: 0.0 standard drinks  . Drug use: No     Medication list has been reviewed and updated.  Current Meds  Medication Sig  . amLODipine (NORVASC) 5 MG tablet TAKE 1 TABLET BY MOUTH EVERY DAY  . aspirin EC 81 MG tablet Take 81 mg by mouth daily. Patient taking 3 times per week  . Calcium-Phosphorus-Vitamin D (CALCIUM/D3 ADULT GUMMIES PO) Take by mouth.  . latanoprost (XALATAN) 0.005 % ophthalmic solution 1 drop at bedtime.  Marland Kitchen losartan (COZAAR) 100 MG  tablet Take 1 tablet (100 mg total) by mouth daily.  . Multiple Vitamin (MULTI-VITAMIN PO) Take by mouth daily.  Derrill Memo ON 11/22/2020] rosuvastatin (CRESTOR) 5 MG tablet Take 1 tablet (5 mg total) by mouth 3 (three) times a week.  . triamcinolone ointment (KENALOG) 0.1 % Apply 1 application topically 2 (two) times daily. To knees and elbows  . TURMERIC CURCUMIN PO Take by mouth.  . [DISCONTINUED] hydrochlorothiazide  (HYDRODIURIL) 25 MG tablet Take 1 tablet (25 mg total) by mouth daily.    PHQ 2/9 Scores 11/21/2020 05/18/2020 05/10/2020 09/23/2019  PHQ - 2 Score 0 0 0 0  PHQ- 9 Score 0 0 - 4    GAD 7 : Generalized Anxiety Score 11/21/2020 05/18/2020  Nervous, Anxious, on Edge 0 0  Control/stop worrying 0 0  Worry too much - different things 0 0  Trouble relaxing 0 0  Restless 0 0  Easily annoyed or irritable 0 0  Afraid - awful might happen 0 0  Total GAD 7 Score 0 0  Anxiety Difficulty - Not difficult at all    BP Readings from Last 3 Encounters:  11/21/20 128/83  05/18/20 138/70  05/10/20 124/82    Physical Exam Vitals and nursing note reviewed.  Constitutional:      General: She is not in acute distress.    Appearance: Normal appearance. She is well-developed.  HENT:     Head: Normocephalic and atraumatic.  Cardiovascular:     Rate and Rhythm: Normal rate and regular rhythm.     Pulses: Normal pulses.     Heart sounds: No murmur heard.   Pulmonary:     Effort: Pulmonary effort is normal. No respiratory distress.     Breath sounds: No wheezing or rhonchi.  Musculoskeletal:        General: Normal range of motion.     Cervical back: Normal range of motion.     Right lower leg: No edema.     Left lower leg: No edema.  Skin:    General: Skin is warm and dry.     Findings: No rash.  Neurological:     Mental Status: She is alert and oriented to person, place, and time.  Psychiatric:        Mood and Affect: Mood normal.        Behavior: Behavior normal.     Wt Readings from Last 3 Encounters:  11/21/20 158 lb (71.7 kg)  05/18/20 154 lb 9.6 oz (70.1 kg)  05/10/20 155 lb 9.6 oz (70.6 kg)    BP 128/83   Pulse 96   Temp 98.5 F (36.9 C) (Oral)   Ht 5\' 1"  (1.549 m)   Wt 158 lb (71.7 kg)   SpO2 100%   BMI 29.85 kg/m   Assessment and Plan: 1. Essential (primary) hypertension Clinically stable exam with well controlled BP. Tolerating medications without side effects at  this time. Pt to continue current regimen and low sodium diet; benefits of regular exercise as able discussed. - hydrochlorothiazide (HYDRODIURIL) 25 MG tablet; Take 1 tablet (25 mg total) by mouth daily.  Dispense: 90 tablet; Refill: 1  2. Mixed hyperlipidemia Did not tolerate atorvastatin; will try low dose crestor tiw - rosuvastatin (CRESTOR) 5 MG tablet; Take 1 tablet (5 mg total) by mouth 3 (three) times a week.  Dispense: 36 tablet; Refill: 1  3. Prediabetes Continue diet and exercise   Partially dictated using Editor, commissioning. Any errors are unintentional.  Halina Maidens, MD [facility]  [facility]  11/21/2020

## 2021-01-12 ENCOUNTER — Other Ambulatory Visit: Payer: Self-pay | Admitting: Internal Medicine

## 2021-01-12 DIAGNOSIS — I10 Essential (primary) hypertension: Secondary | ICD-10-CM

## 2021-04-06 ENCOUNTER — Encounter: Payer: Self-pay | Admitting: Internal Medicine

## 2021-04-06 ENCOUNTER — Ambulatory Visit (INDEPENDENT_AMBULATORY_CARE_PROVIDER_SITE_OTHER): Payer: Medicare Other | Admitting: Internal Medicine

## 2021-04-06 ENCOUNTER — Other Ambulatory Visit: Payer: Self-pay

## 2021-04-06 VITALS — BP 130/82 | HR 86 | Ht 61.0 in | Wt 156.0 lb

## 2021-04-06 DIAGNOSIS — L309 Dermatitis, unspecified: Secondary | ICD-10-CM

## 2021-04-06 DIAGNOSIS — I10 Essential (primary) hypertension: Secondary | ICD-10-CM | POA: Diagnosis not present

## 2021-04-06 DIAGNOSIS — Z23 Encounter for immunization: Secondary | ICD-10-CM | POA: Diagnosis not present

## 2021-04-06 DIAGNOSIS — R55 Syncope and collapse: Secondary | ICD-10-CM | POA: Diagnosis not present

## 2021-04-06 MED ORDER — TRIAMCINOLONE ACETONIDE 0.1 % EX OINT: 1.0000 "application " | TOPICAL_OINTMENT | Freq: Two times a day (BID) | CUTANEOUS | 0 refills | Status: DC

## 2021-04-06 NOTE — Progress Notes (Signed)
Date:  04/06/2021   Name:  Bethany Martinez   DOB:  [birth year]   MRN:  [id number]   Chief Complaint: Hypertension  Hypertension This is a chronic problem. The current episode started more than 1 year ago. The problem has been waxing and waning since onset. The problem is uncontrolled. Associated symptoms include blurred vision and sweats. Pertinent negatives include no chest pain, headaches or shortness of breath.  Loss of Consciousness This is a new problem. Episode onset: one episode in early July. Length of episode of loss of consciousness: she does not know how long she was out. The symptoms are aggravated by exercise (she had been outside cutting grass and felt lightheaded). Associated symptoms include light-headedness. Pertinent negatives include no chest pain, dizziness, fever or headaches. Her past medical history is significant for DM and HTN. Patient says she passed out in July 6th 2022 and checked her blood pressure and it was low. She said she is having more frequent blood pressure drops, and feeling like she will have syncope.   Lab Results  Component Value Date   CREATININE 0.86 05/18/2020   BUN 13 05/18/2020   NA 136 05/18/2020   K 4.1 05/18/2020   CL 94 (L) 05/18/2020   CO2 23 05/18/2020   Lab Results  Component Value Date   CHOL 234 (H) 05/18/2020   HDL 51 05/18/2020   LDLCALC 157 (H) 05/18/2020   TRIG 144 05/18/2020   CHOLHDL 4.6 (H) 05/18/2020   Lab Results  Component Value Date   TSH 1.690 05/18/2020   Lab Results  Component Value Date   HGBA1C 6.1 (H) 05/18/2020   Lab Results  Component Value Date   WBC 6.4 05/18/2020   HGB 13.5 05/18/2020   HCT 41.2 05/18/2020   MCV 86 05/18/2020   PLT 318 05/18/2020   Lab Results  Component Value Date   ALT 18 05/18/2020   AST 21 05/18/2020   ALKPHOS 93 05/18/2020   BILITOT 0.8 05/18/2020     Review of Systems  Constitutional:  Negative for chills, fatigue and fever.  HENT:  Positive for sinus  pressure.   Eyes:  Positive for blurred vision.  Respiratory:  Negative for chest tightness and shortness of breath.   Cardiovascular:  Positive for syncope. Negative for chest pain and leg swelling.  Neurological:  Positive for syncope and light-headedness. Negative for dizziness and headaches.   Patient Active Problem List   Diagnosis Date Noted   Age-related osteoporosis without fracture 05/18/2020   Mixed hyperlipidemia 05/18/2019   Eczema 07/18/2017   Prediabetes 06/07/2015   Edema extremities 01/11/2015   Allergic state 01/11/2015   Essential (primary) hypertension 01/11/2015    Allergies  Allergen Reactions   Atorvastatin Other (See Comments)    myalgia    Past Surgical History:  Procedure Laterality Date   ABDOMINAL HYSTERECTOMY  1988   partial   COLONOSCOPY  01/2009   Dr. Jamal Collin    Social History   Tobacco Use   Smoking status: Never   Smokeless tobacco: Never   Tobacco comments:    smoking cessation materials not required  Vaping Use   Vaping Use: Never used  Substance Use Topics   Alcohol use: No    Alcohol/week: 0.0 standard drinks   Drug use: No     Medication list has been reviewed and updated.  Current Meds  Medication Sig   amLODipine (NORVASC) 5 MG tablet TAKE 1 TABLET BY MOUTH EVERY DAY   aspirin  EC 81 MG tablet Take 81 mg by mouth daily. Patient taking 3 times per week   Calcium-Phosphorus-Vitamin D (CALCIUM/D3 ADULT GUMMIES PO) Take by mouth.   hydrochlorothiazide (HYDRODIURIL) 25 MG tablet Take 1 tablet (25 mg total) by mouth daily.   latanoprost (XALATAN) 0.005 % ophthalmic solution 1 drop at bedtime.   losartan (COZAAR) 100 MG tablet Take 1 tablet (100 mg total) by mouth daily.   Multiple Vitamin (MULTI-VITAMIN PO) Take by mouth daily.   Potassium 99 MG TABS Take by mouth.   rosuvastatin (CRESTOR) 5 MG tablet Take 1 tablet (5 mg total) by mouth 3 (three) times a week.   triamcinolone ointment (KENALOG) 0.1 % Apply 1 application  topically 2 (two) times daily. To knees and elbows   TURMERIC CURCUMIN PO Take by mouth.    PHQ 2/9 Scores 04/06/2021 11/21/2020 05/18/2020 05/10/2020  PHQ - 2 Score 0 0 0 0  PHQ- 9 Score 1 0 0 -    GAD 7 : Generalized Anxiety Score 04/06/2021 11/21/2020 05/18/2020  Nervous, Anxious, on Edge 0 0 0  Control/stop worrying 0 0 0  Worry too much - different things 0 0 0  Trouble relaxing 0 0 0  Restless 0 0 0  Easily annoyed or irritable 0 0 0  Afraid - awful might happen 0 0 0  Total GAD 7 Score 0 0 0  Anxiety Difficulty Not difficult at all - Not difficult at all    BP Readings from Last 3 Encounters:  04/06/21 130/82  11/21/20 128/83  05/18/20 138/70    Physical Exam Vitals and nursing note reviewed.  Constitutional:      General: She is not in acute distress.    Appearance: Normal appearance. She is well-developed.  HENT:     Head: Normocephalic and atraumatic.  Neck:     Vascular: No carotid bruit.  Cardiovascular:     Rate and Rhythm: Normal rate and regular rhythm.     Pulses: Normal pulses.     Heart sounds: No murmur heard. Pulmonary:     Effort: Pulmonary effort is normal. No respiratory distress.     Breath sounds: No wheezing or rhonchi.  Musculoskeletal:     Cervical back: Normal range of motion.     Right lower leg: No edema.     Left lower leg: No edema.  Lymphadenopathy:     Cervical: No cervical adenopathy.  Skin:    General: Skin is warm and dry.     Capillary Refill: Capillary refill takes less than 2 seconds.     Findings: No rash.  Neurological:     General: No focal deficit present.     Mental Status: She is alert and oriented to person, place, and time.  Psychiatric:        Mood and Affect: Mood normal.        Behavior: Behavior normal.    Wt Readings from Last 3 Encounters:  04/06/21 156 lb (70.8 kg)  11/21/20 158 lb (71.7 kg)  05/18/20 154 lb 9.6 oz (70.1 kg)    BP 130/82 (BP Location: Right Arm, Patient Position: Sitting, Cuff Size:  Large)   Pulse 86   Ht '5\' 1"'$  (1.549 m)   Wt 156 lb (70.8 kg)   SpO2 97%   BMI 29.48 kg/m   Assessment and Plan: 1. Essential (primary) hypertension Clinically stable exam with well controlled BP. Tolerating medications without side effects at this time. Pt to continue current regimen and low sodium diet;  benefits of regular exercise as able discussed. - EKG [id number] - Basic metabolic panel  2. Syncope, unspecified syncope type Stop potassium.  Avoid over-heating and over-exertion. If syncope recurs, call 911 or go to ED. EKG is reassuring.  Will get carotid US. Continue Crestor and ASA - EKG 12-Lead - NSR @ 75; WNL - CBC with Differential/Platelet - US Carotid Duplex Bilateral   Partially dictated using Editor, commissioning. Any errors are unintentional.  Halina Maidens, MD [facility]  04/06/2021

## 2021-04-06 NOTE — Patient Instructions (Signed)
Stop potassium supplement

## 2021-04-07 LAB — BASIC METABOLIC PANEL
BUN/Creatinine Ratio: 16 (ref 12–28)
BUN: 14 mg/dL (ref 8–27)
CO2: 25 mmol/L (ref 20–29)
Calcium: 9.6 mg/dL (ref 8.7–10.3)
Chloride: 98 mmol/L (ref 96–106)
Creatinine, Ser: 0.87 mg/dL (ref 0.57–1.00)
Glucose: 85 mg/dL (ref 65–99)
Potassium: 3.7 mmol/L (ref 3.5–5.2)
Sodium: 138 mmol/L (ref 134–144)
eGFR: 70 mL/min/{1.73_m2} (ref >59)

## 2021-04-07 LAB — CBC WITH DIFFERENTIAL/PLATELET
Basophils Absolute: 0 10*3/uL (ref 0.0–0.2)
Basos: 0 %
EOS (ABSOLUTE): 0.1 10*3/uL (ref 0.0–0.4)
Eos: 1 %
Hematocrit: 37.2 % (ref 34.0–46.6)
Hemoglobin: 12.3 g/dL (ref 11.1–15.9)
Immature Grans (Abs): 0 10*3/uL (ref 0.0–0.1)
Immature Granulocytes: 0 %
Lymphocytes Absolute: 1.6 10*3/uL (ref 0.7–3.1)
Lymphs: 23 %
MCH: 28.3 pg (ref 26.6–33.0)
MCHC: 33.1 g/dL (ref 31.5–35.7)
MCV: 86 fL (ref 79–97)
Monocytes Absolute: 0.7 10*3/uL (ref 0.1–0.9)
Monocytes: 9 %
Neutrophils Absolute: 4.7 10*3/uL (ref 1.4–7.0)
Neutrophils: 67 %
Platelets: 318 10*3/uL (ref 150–450)
RBC: 4.35 x10E6/uL (ref 3.77–5.28)
RDW: 13.8 % (ref 11.7–15.4)
WBC: 7.1 10*3/uL (ref 3.4–10.8)

## 2021-04-09 NOTE — Progress Notes (Signed)
Pt viewed labs on Mychart.  KP 

## 2021-04-20 DIAGNOSIS — H401131 Primary open-angle glaucoma, bilateral, mild stage: Secondary | ICD-10-CM | POA: Diagnosis not present

## 2021-04-20 DIAGNOSIS — H1045 Other chronic allergic conjunctivitis: Secondary | ICD-10-CM | POA: Diagnosis not present

## 2021-04-20 DIAGNOSIS — H2513 Age-related nuclear cataract, bilateral: Secondary | ICD-10-CM | POA: Diagnosis not present

## 2021-04-23 DIAGNOSIS — J301 Allergic rhinitis due to pollen: Secondary | ICD-10-CM | POA: Diagnosis not present

## 2021-04-23 DIAGNOSIS — R42 Dizziness and giddiness: Secondary | ICD-10-CM | POA: Diagnosis not present

## 2021-04-24 ENCOUNTER — Ambulatory Visit
Admission: RE | Admit: 2021-04-24 | Discharge: 2021-04-24 | Disposition: A | Discharge status: Home / Self Care | Payer: Medicare Other | Source: Ambulatory Visit | Attending: Internal Medicine | Admitting: Internal Medicine

## 2021-04-24 ENCOUNTER — Other Ambulatory Visit: Payer: Self-pay

## 2021-04-24 DIAGNOSIS — I6523 Occlusion and stenosis of bilateral carotid arteries: Secondary | ICD-10-CM | POA: Diagnosis not present

## 2021-04-24 DIAGNOSIS — R55 Syncope and collapse: Secondary | ICD-10-CM | POA: Insufficient documentation

## 2021-05-03 ENCOUNTER — Other Ambulatory Visit: Payer: Self-pay | Admitting: Internal Medicine

## 2021-05-03 DIAGNOSIS — E782 Mixed hyperlipidemia: Secondary | ICD-10-CM

## 2021-05-03 NOTE — Telephone Encounter (Signed)
Requested Prescriptions  Pending Prescriptions Disp Refills  . rosuvastatin (CRESTOR) 5 MG tablet [Pharmacy Med Name: ROSUVASTATIN CALCIUM 5 MG TAB] 36 tablet 1    Sig: TAKE 1 TABLET BY MOUTH 3 TIMES A WEEK.     Cardiovascular:  Antilipid - Statins Failed - 05/03/2021  1:42 AM      Failed - Total Cholesterol in normal range and within 360 days    Cholesterol, Total  Date Value Ref Range Status  05/18/2020 234 (H) 100 - 199 mg/dL Final         Failed - LDL in normal range and within 360 days    LDL Chol Calc (NIH)  Date Value Ref Range Status  05/18/2020 157 (H) 0 - 99 mg/dL Final         Passed - HDL in normal range and within 360 days    HDL  Date Value Ref Range Status  05/18/2020 51 >39 mg/dL Final         Passed - Triglycerides in normal range and within 360 days    Triglycerides  Date Value Ref Range Status  05/18/2020 144 0 - 149 mg/dL Final         Passed - Patient is not pregnant      Passed - Valid encounter within last 12 months    Recent Outpatient Visits          3 weeks ago Essential (primary) hypertension   [facility] Glean Hess, MD   5 months ago Essential (primary) hypertension   [facility] Glean Hess, MD   11 months ago Annual physical exam   [facility] Glean Hess, MD   1 year ago Type II diabetes mellitus with complication [facility])   [facility] Glean Hess, MD   1 year ago Annual physical exam   [facility] Glean Hess, MD      Future Appointments            In 2 weeks [facility] Melia Jesse Sans, MD [facility], [facility]

## 2021-05-04 ENCOUNTER — Other Ambulatory Visit: Payer: Self-pay | Admitting: Internal Medicine

## 2021-05-04 DIAGNOSIS — I10 Essential (primary) hypertension: Secondary | ICD-10-CM

## 2021-05-04 NOTE — Telephone Encounter (Signed)
Requested Prescriptions  Pending Prescriptions Disp Refills  . hydrochlorothiazide (HYDRODIURIL) 25 MG tablet [Pharmacy Med Name: HYDROCHLOROTHIAZIDE 25 MG TAB] 90 tablet 1    Sig: TAKE 1 TABLET (25 MG TOTAL) BY MOUTH DAILY.     Cardiovascular: Diuretics - Thiazide Passed - 05/04/2021  2:16 AM      Passed - Ca in normal range and within 360 days    Calcium  Date Value Ref Range Status  04/06/2021 9.6 8.7 - 10.3 mg/dL Final         Passed - Cr in normal range and within 360 days    Creatinine, Ser  Date Value Ref Range Status  04/06/2021 0.87 0.57 - 1.00 mg/dL Final         Passed - K in normal range and within 360 days    Potassium  Date Value Ref Range Status  04/06/2021 3.7 3.5 - 5.2 mmol/L Final         Passed - Na in normal range and within 360 days    Sodium  Date Value Ref Range Status  04/06/2021 138 134 - 144 mmol/L Final         Passed - Last BP in normal range    BP Readings from Last 1 Encounters:  04/06/21 130/82         Passed - Valid encounter within last 6 months    Recent Outpatient Visits          4 weeks ago Essential (primary) hypertension   [facility] Glean Hess, MD   5 months ago Essential (primary) hypertension   [facility] Glean Hess, MD   11 months ago Annual physical exam   [facility] Glean Hess, MD   1 year ago Type II diabetes mellitus with complication [facility])   [facility] Glean Hess, MD   1 year ago Annual physical exam   [facility] Glean Hess, MD      Future Appointments            In 2 weeks [employer] Melia Jesse Sans, MD [facility], [facility]

## 2021-05-09 ENCOUNTER — Other Ambulatory Visit: Payer: Self-pay | Admitting: Internal Medicine

## 2021-05-09 DIAGNOSIS — Z1231 Encounter for screening mammogram for malignant neoplasm of breast: Secondary | ICD-10-CM

## 2021-05-10 ENCOUNTER — Other Ambulatory Visit: Payer: Self-pay | Admitting: Internal Medicine

## 2021-05-10 DIAGNOSIS — I10 Essential (primary) hypertension: Secondary | ICD-10-CM

## 2021-05-10 NOTE — Telephone Encounter (Signed)
Requested Prescriptions  Pending Prescriptions Disp Refills  . losartan (COZAAR) 100 MG tablet [Pharmacy Med Name: LOSARTAN POTASSIUM 100 MG TAB] 90 tablet 1    Sig: TAKE 1 TABLET BY MOUTH EVERY DAY     Cardiovascular:  Angiotensin Receptor Blockers Passed - 05/10/2021  3:51 AM      Passed - Cr in normal range and within 180 days    Creatinine, Ser  Date Value Ref Range Status  04/06/2021 0.87 0.57 - 1.00 mg/dL Final         Passed - K in normal range and within 180 days    Potassium  Date Value Ref Range Status  04/06/2021 3.7 3.5 - 5.2 mmol/L Final         Passed - Patient is not pregnant      Passed - Last BP in normal range    BP Readings from Last 1 Encounters:  04/06/21 130/82         Passed - Valid encounter within last 6 months    Recent Outpatient Visits          1 month ago Essential (primary) hypertension   [facility] Glean Hess, MD   5 months ago Essential (primary) hypertension   [facility] Glean Hess, MD   11 months ago Annual physical exam   [facility] Glean Hess, MD   1 year ago Type II diabetes mellitus with complication [facility])   [facility] Glean Hess, MD   1 year ago Annual physical exam   [facility] Glean Hess, MD      Future Appointments            In 1 week [employer] Melia Jesse Sans, MD [facility], [facility]

## 2021-05-14 ENCOUNTER — Ambulatory Visit: Payer: Medicare Other

## 2021-05-22 ENCOUNTER — Other Ambulatory Visit: Payer: Self-pay

## 2021-05-22 ENCOUNTER — Ambulatory Visit (INDEPENDENT_AMBULATORY_CARE_PROVIDER_SITE_OTHER): Payer: Medicare Other | Admitting: Internal Medicine

## 2021-05-22 ENCOUNTER — Encounter: Payer: Self-pay | Admitting: Internal Medicine

## 2021-05-22 VITALS — BP 122/70 | HR 88 | Ht 61.0 in | Wt 152.4 lb

## 2021-05-22 DIAGNOSIS — L03311 Cellulitis of abdominal wall: Secondary | ICD-10-CM

## 2021-05-22 DIAGNOSIS — Z1211 Encounter for screening for malignant neoplasm of colon: Secondary | ICD-10-CM

## 2021-05-22 DIAGNOSIS — E782 Mixed hyperlipidemia: Secondary | ICD-10-CM | POA: Diagnosis not present

## 2021-05-22 DIAGNOSIS — Z0000 Encounter for general adult medical examination without abnormal findings: Secondary | ICD-10-CM

## 2021-05-22 DIAGNOSIS — Z1231 Encounter for screening mammogram for malignant neoplasm of breast: Secondary | ICD-10-CM | POA: Diagnosis not present

## 2021-05-22 DIAGNOSIS — I10 Essential (primary) hypertension: Secondary | ICD-10-CM | POA: Diagnosis not present

## 2021-05-22 DIAGNOSIS — M818 Other osteoporosis without current pathological fracture: Secondary | ICD-10-CM | POA: Diagnosis not present

## 2021-05-22 DIAGNOSIS — R7303 Prediabetes: Secondary | ICD-10-CM

## 2021-05-22 LAB — POCT URINALYSIS DIPSTICK
Bilirubin, UA: NEGATIVE
Blood, UA: NEGATIVE
Glucose, UA: NEGATIVE
Ketones, UA: NEGATIVE
Leukocytes, UA: NEGATIVE
Nitrite, UA: NEGATIVE
Protein, UA: NEGATIVE
Spec Grav, UA: 1.01 (ref 1.010–1.025)
Urobilinogen, UA: 0.2 E.U./dL
pH, UA: 7 (ref 5.0–8.0)

## 2021-05-22 MED ORDER — DOXYCYCLINE HYCLATE 100 MG PO TABS: 100.0000 mg | ORAL_TABLET | Freq: Two times a day (BID) | ORAL | 0 refills | Status: AC

## 2021-05-22 MED ORDER — AMLODIPINE BESYLATE 5 MG PO TABS: 5.0000 mg | ORAL_TABLET | Freq: Every day | ORAL | 1 refills | Status: DC

## 2021-05-22 NOTE — Progress Notes (Signed)
Date:  05/22/2021   Name:  Bethany Martinez   DOB:  [birth year]   MRN:  [id number]   Chief Complaint: Annual Exam (Breast Exam, No Pap.) Bethany Martinez is a 75 y.o. female who presents today for her Complete Annual Exam. She feels well. She reports exercising, walking, and riding her bicycle. She reports she is sleeping fairly well. Breast complaints - none.  Mammogram: scheduled DEXA: 05/2020 OP spine/osteopenia hip Pap smear: discontinued Colonoscopy: FIT 05/2020  Immunization History  Administered Date(s) Administered   Fluad Quad(high Dose 65+) 05/10/2019, 05/10/2020, 04/06/2021   Influenza, High Dose Seasonal PF 05/23/2017, 04/17/2018   Influenza-Unspecified 05/11/2015, 05/23/2017, 06/22/2019   Moderna Sars-Covid-2 Vaccination 09/20/2019, 10/18/2019   Pneumococcal Conjugate-13 06/07/2015   Pneumococcal Polysaccharide-23 12/01/2012   Tdap 11/27/2009   Zoster Recombinat (Shingrix) 06/22/2019, 01/27/2020   Zoster, Live 01/11/2011    Hypertension This is a chronic problem. The problem is controlled (122/76). Pertinent negatives include no chest pain, headaches, palpitations or shortness of breath. Past treatments include calcium channel blockers, diuretics and angiotensin blockers.  Hyperlipidemia This is a chronic problem. The problem is controlled. Pertinent negatives include no chest pain or shortness of breath. Current antihyperlipidemic treatment includes statins. The current treatment provides significant improvement of lipids.  Diabetes She presents for her follow-up diabetic visit. Diabetes type: prediabetes. Her disease course has been stable. Pertinent negatives for hypoglycemia include no dizziness, headaches, nervousness/anxiousness or tremors. Pertinent negatives for diabetes include no chest pain, no fatigue, no polydipsia and no polyuria.   Lab Results  Component Value Date   CREATININE 0.87 04/06/2021   BUN 14 04/06/2021   NA 138 04/06/2021   K  3.7 04/06/2021   CL 98 04/06/2021   CO2 25 04/06/2021   Lab Results  Component Value Date   CHOL 234 (H) 05/18/2020   HDL 51 05/18/2020   LDLCALC 157 (H) 05/18/2020   TRIG 144 05/18/2020   CHOLHDL 4.6 (H) 05/18/2020   Lab Results  Component Value Date   TSH 1.690 05/18/2020   Lab Results  Component Value Date   HGBA1C 6.1 (H) 05/18/2020   Lab Results  Component Value Date   WBC 7.1 04/06/2021   HGB 12.3 04/06/2021   HCT 37.2 04/06/2021   MCV 86 04/06/2021   PLT 318 04/06/2021   Lab Results  Component Value Date   ALT 18 05/18/2020   AST 21 05/18/2020   ALKPHOS 93 05/18/2020   BILITOT 0.8 05/18/2020     Review of Systems  Constitutional:  Negative for chills, fatigue and fever.  HENT:  Negative for congestion, hearing loss, tinnitus, trouble swallowing and voice change.   Eyes:  Negative for visual disturbance.  Respiratory:  Negative for cough, chest tightness, shortness of breath and wheezing.   Cardiovascular:  Negative for chest pain, palpitations and leg swelling.  Gastrointestinal:  Negative for abdominal pain, constipation, diarrhea and vomiting.  Endocrine: Negative for polydipsia and polyuria.  Genitourinary:  Negative for dysuria, frequency, genital sores, vaginal bleeding and vaginal discharge.  Musculoskeletal:  Negative for arthralgias, gait problem and joint swelling.  Skin:  Positive for rash (on mid abdomen). Negative for color change.  Neurological:  Negative for dizziness, tremors, light-headedness and headaches.  Hematological:  Negative for adenopathy. Does not bruise/bleed easily.  Psychiatric/Behavioral:  Negative for dysphoric mood and sleep disturbance. The patient is not nervous/anxious.    Patient Active Problem List   Diagnosis Date Noted   Age-related osteoporosis without fracture 05/18/2020  Mixed hyperlipidemia 05/18/2019   Eczema 07/18/2017   Prediabetes 06/07/2015   Edema extremities 01/11/2015   Allergic state 01/11/2015    Essential (primary) hypertension 01/11/2015    Allergies  Allergen Reactions   Atorvastatin Other (See Comments)    myalgia    Past Surgical History:  Procedure Laterality Date   ABDOMINAL HYSTERECTOMY  1988   partial   COLONOSCOPY  01/2009   Dr. Jamal Collin    Social History   Tobacco Use   Smoking status: Never   Smokeless tobacco: Never   Tobacco comments:    smoking cessation materials not required  Vaping Use   Vaping Use: Never used  Substance Use Topics   Alcohol use: No    Alcohol/week: 0.0 standard drinks   Drug use: No     Medication list has been reviewed and updated.  Current Meds  Medication Sig   aspirin EC 81 MG tablet Take 81 mg by mouth daily. Patient taking 3 times per week   Calcium-Phosphorus-Vitamin D (CALCIUM/D3 ADULT GUMMIES PO) Take by mouth.   doxycycline (VIBRA-TABS) 100 MG tablet Take 1 tablet (100 mg total) by mouth 2 (two) times daily for 10 days.   hydrochlorothiazide (HYDRODIURIL) 25 MG tablet TAKE 1 TABLET (25 MG TOTAL) BY MOUTH DAILY.   latanoprost (XALATAN) 0.005 % ophthalmic solution 1 drop at bedtime.   losartan (COZAAR) 100 MG tablet TAKE 1 TABLET BY MOUTH EVERY DAY   Multiple Vitamin (MULTI-VITAMIN PO) Take by mouth daily.   Potassium 99 MG TABS Take by mouth.   rosuvastatin (CRESTOR) 5 MG tablet TAKE 1 TABLET BY MOUTH 3 TIMES A WEEK.   triamcinolone ointment (KENALOG) 0.1 % Apply 1 application topically 2 (two) times daily. To knees and elbows   TURMERIC CURCUMIN PO Take by mouth.   [DISCONTINUED] amLODipine (NORVASC) 5 MG tablet TAKE 1 TABLET BY MOUTH EVERY DAY    PHQ 2/9 Scores 05/22/2021 04/06/2021 11/21/2020 05/18/2020  PHQ - 2 Score 0 0 0 0  PHQ- 9 Score 0 1 0 0    GAD 7 : Generalized Anxiety Score 05/22/2021 04/06/2021 11/21/2020 05/18/2020  Nervous, Anxious, on Edge 0 0 0 0  Control/stop worrying 0 0 0 0  Worry too much - different things 0 0 0 0  Trouble relaxing 0 0 0 0  Restless 0 0 0 0  Easily annoyed or irritable 0 0  0 0  Afraid - awful might happen 0 0 0 0  Total GAD 7 Score 0 0 0 0  Anxiety Difficulty Not difficult at all Not difficult at all - Not difficult at all    BP Readings from Last 3 Encounters:  05/22/21 122/70  04/06/21 130/82  11/21/20 128/83    Physical Exam Vitals and nursing note reviewed.  Constitutional:      General: She is not in acute distress.    Appearance: She is well-developed.  HENT:     Head: Normocephalic and atraumatic.     Right Ear: Tympanic membrane and ear canal normal.     Left Ear: Tympanic membrane and ear canal normal.     Nose:     Right Sinus: No maxillary sinus tenderness.     Left Sinus: No maxillary sinus tenderness.  Eyes:     General: No scleral icterus.       Right eye: No discharge.        Left eye: No discharge.     Conjunctiva/sclera: Conjunctivae normal.  Neck:     Thyroid:  No thyromegaly.     Vascular: No carotid bruit.  Cardiovascular:     Rate and Rhythm: Normal rate and regular rhythm.     Pulses: Normal pulses.     Heart sounds: Normal heart sounds.  Pulmonary:     Effort: Pulmonary effort is normal. No respiratory distress.     Breath sounds: No wheezing.  Chest:  Breasts:    Right: No mass, nipple discharge, skin change or tenderness.     Left: No mass, nipple discharge, skin change or tenderness.  Abdominal:     General: Bowel sounds are normal.     Palpations: Abdomen is soft.     Tenderness: There is no abdominal tenderness.       Comments: Erythema without induration; central punctate lesion, no target lesion  Musculoskeletal:        General: Normal range of motion.     Cervical back: Normal range of motion. No erythema.     Right lower leg: No edema.     Left lower leg: No edema.  Lymphadenopathy:     Cervical: No cervical adenopathy.  Skin:    General: Skin is warm and dry.     Capillary Refill: Capillary refill takes less than 2 seconds.     Findings: Erythema (with central lesion c/w insect bite) and rash  present.  Neurological:     General: No focal deficit present.     Mental Status: She is alert and oriented to person, place, and time.     Cranial Nerves: No cranial nerve deficit.     Sensory: No sensory deficit.     Deep Tendon Reflexes: Reflexes are normal and symmetric.  Psychiatric:        Attention and Perception: Attention normal.        Mood and Affect: Mood normal.    Wt Readings from Last 3 Encounters:  05/22/21 152 lb 6.4 oz (69.1 kg)  04/06/21 156 lb (70.8 kg)  11/21/20 158 lb (71.7 kg)    BP 122/70   Pulse 88   Ht 5\' 1"  (1.549 m)   Wt 152 lb 6.4 oz (69.1 kg)   SpO2 96%   BMI 28.80 kg/m   Assessment and Plan: 1. Annual physical exam Normal exam - continue healthy diet and exercise Up to date on screenings and immunizations  2. Encounter for screening mammogram for breast cancer scheduled  3. Colon cancer screening Has done FIT in the past but wants to have a colonoscopy - Ambulatory referral to Gastroenterology  4. Essential (primary) hypertension Clinically stable exam with well controlled BP. Tolerating medications without side effects at this time. Pt to continue current regimen and low sodium diet; benefits of regular exercise as able discussed. - CBC with Differential/Platelet - Comprehensive metabolic panel - TSH - POCT urinalysis dipstick - amLODipine (NORVASC) 5 MG tablet; Take 1 tablet (5 mg total) by mouth daily.  Dispense: 90 tablet; Refill: 1  5. Age-related osteoporosis without fracture Continue calcium and vitamin D and exercise  6. Mixed hyperlipidemia Tolerating statin medication without side effects at this time LDL is at goal of < 70 on current dose. Recent Carotid US showed no significant hemodynamic stenosis Continue same therapy without change at this time. - Lipid panel  7. Prediabetes Check labs and advise Continue diet and exercise and weight loss - Hemoglobin A1c  8. Cellulitis of abdominal wall - doxycycline  (VIBRA-TABS) 100 MG tablet; Take 1 tablet (100 mg total) by mouth 2 (two) times daily  for 10 days.  Dispense: 20 tablet; Refill: 0   Partially dictated using Editor, commissioning. Any errors are unintentional.  Halina Maidens, MD [facility]  05/22/2021

## 2021-05-23 ENCOUNTER — Other Ambulatory Visit: Payer: Self-pay | Admitting: Gastroenterology

## 2021-05-23 ENCOUNTER — Other Ambulatory Visit: Payer: Self-pay

## 2021-05-23 DIAGNOSIS — Z1211 Encounter for screening for malignant neoplasm of colon: Secondary | ICD-10-CM

## 2021-05-23 DIAGNOSIS — Z800 Family history of malignant neoplasm of digestive organs: Secondary | ICD-10-CM

## 2021-05-23 LAB — CBC WITH DIFFERENTIAL/PLATELET
Basophils Absolute: 0 10*3/uL (ref 0.0–0.2)
Basos: 0 %
EOS (ABSOLUTE): 0.1 10*3/uL (ref 0.0–0.4)
Eos: 2 %
Hematocrit: 36.6 % (ref 34.0–46.6)
Hemoglobin: 12.2 g/dL (ref 11.1–15.9)
Immature Grans (Abs): 0 10*3/uL (ref 0.0–0.1)
Immature Granulocytes: 0 %
Lymphocytes Absolute: 1.3 10*3/uL (ref 0.7–3.1)
Lymphs: 25 %
MCH: 28.1 pg (ref 26.6–33.0)
MCHC: 33.3 g/dL (ref 31.5–35.7)
MCV: 84 fL (ref 79–97)
Monocytes Absolute: 0.5 10*3/uL (ref 0.1–0.9)
Monocytes: 9 %
Neutrophils Absolute: 3.5 10*3/uL (ref 1.4–7.0)
Neutrophils: 64 %
Platelets: 308 10*3/uL (ref 150–450)
RBC: 4.34 x10E6/uL (ref 3.77–5.28)
RDW: 14.1 % (ref 11.7–15.4)
WBC: 5.4 10*3/uL (ref 3.4–10.8)

## 2021-05-23 LAB — HEMOGLOBIN A1C
Est. average glucose Bld gHb Est-mCnc: 131 mg/dL
Hgb A1c MFr Bld: 6.2 % — ABNORMAL HIGH (ref 4.8–5.6)

## 2021-05-23 LAB — COMPREHENSIVE METABOLIC PANEL
ALT: 19 IU/L (ref 0–32)
AST: 20 IU/L (ref 0–40)
Albumin/Globulin Ratio: 1.6 (ref 1.2–2.2)
Albumin: 4.4 g/dL (ref 3.7–4.7)
Alkaline Phosphatase: 84 IU/L (ref 44–121)
BUN/Creatinine Ratio: 13 (ref 12–28)
BUN: 13 mg/dL (ref 8–27)
Bilirubin Total: 0.6 mg/dL (ref 0.0–1.2)
CO2: 27 mmol/L (ref 20–29)
Calcium: 9.7 mg/dL (ref 8.7–10.3)
Chloride: 96 mmol/L (ref 96–106)
Creatinine, Ser: 0.97 mg/dL (ref 0.57–1.00)
Globulin, Total: 2.8 g/dL (ref 1.5–4.5)
Glucose: 103 mg/dL — ABNORMAL HIGH (ref 70–99)
Potassium: 3.6 mmol/L (ref 3.5–5.2)
Sodium: 142 mmol/L (ref 134–144)
Total Protein: 7.2 g/dL (ref 6.0–8.5)
eGFR: 61 mL/min/{1.73_m2} (ref >59)

## 2021-05-23 LAB — LIPID PANEL
Chol/HDL Ratio: 4.9 ratio — ABNORMAL HIGH (ref 0.0–4.4)
Cholesterol, Total: 253 mg/dL — ABNORMAL HIGH (ref 100–199)
HDL: 52 mg/dL (ref >39)
LDL Chol Calc (NIH): 175 mg/dL — ABNORMAL HIGH (ref 0–99)
Triglycerides: 141 mg/dL (ref 0–149)
VLDL Cholesterol Cal: 26 mg/dL (ref 5–40)

## 2021-05-23 LAB — TSH: TSH: 2.2 u[IU]/mL (ref 0.450–4.500)

## 2021-05-23 MED ORDER — CLENPIQ 10-3.5-12 MG-GM -GM/160ML PO SOLN: 1.0000 | ORAL | 0 refills | Status: DC

## 2021-05-23 MED ORDER — NA SULFATE-K SULFATE-MG SULF 17.5-3.13-1.6 GM/177ML PO SOLN: 1.0000 | Freq: Once | ORAL | 0 refills | Status: DC

## 2021-05-23 NOTE — Progress Notes (Signed)
Sent in different prep first one not covered

## 2021-05-23 NOTE — Progress Notes (Signed)
Gastroenterology Pre-Procedure Review  Request Date: 07/12/21 Requesting Physician: Dr. Marius Ditch  PATIENT REVIEW QUESTIONS: The patient responded to the following health history questions as indicated:    1. Are you having any GI issues? no 2. Do you have a personal history of Polyps? no 3. Do you have a family history of Colon Cancer or Polyps? yes (Sister- colon cancer) 4. Diabetes Mellitus? no 5. Joint replacements in the past 12 months?no 6. Major health problems in the past 3 months?no 7. Any artificial heart valves, MVP, or defibrillator?no    MEDICATIONS & ALLERGIES:    Patient reports the following regarding taking any anticoagulation/antiplatelet therapy:   Plavix, Coumadin, Eliquis, Xarelto, Lovenox, Pradaxa, Brilinta, or Effient? no Aspirin? Yes, 81 mg  Patient confirms/reports the following medications:  Current Outpatient Medications  Medication Sig Dispense Refill   amLODipine (NORVASC) 5 MG tablet Take 1 tablet (5 mg total) by mouth daily. 90 tablet 1   aspirin EC 81 MG tablet Take 81 mg by mouth daily. Patient taking 3 times per week     Calcium-Phosphorus-Vitamin D (CALCIUM/D3 ADULT GUMMIES PO) Take by mouth.     doxycycline (VIBRA-TABS) 100 MG tablet Take 1 tablet (100 mg total) by mouth 2 (two) times daily for 10 days. 20 tablet 0   hydrochlorothiazide (HYDRODIURIL) 25 MG tablet TAKE 1 TABLET (25 MG TOTAL) BY MOUTH DAILY. 90 tablet 1   latanoprost (XALATAN) 0.005 % ophthalmic solution 1 drop at bedtime.     losartan (COZAAR) 100 MG tablet TAKE 1 TABLET BY MOUTH EVERY DAY 90 tablet 1   Multiple Vitamin (MULTI-VITAMIN PO) Take by mouth daily.     Potassium 99 MG TABS Take by mouth.     rosuvastatin (CRESTOR) 5 MG tablet TAKE 1 TABLET BY MOUTH 3 TIMES A WEEK. 36 tablet 1   triamcinolone ointment (KENALOG) 0.1 % Apply 1 application topically 2 (two) times daily. To knees and elbows 80 g 0   TURMERIC CURCUMIN PO Take by mouth.     No current facility-administered  medications for this visit.    Patient confirms/reports the following allergies:  Allergies  Allergen Reactions   Atorvastatin Other (See Comments)    myalgia    No orders of the defined types were placed in this encounter.   AUTHORIZATION INFORMATION Primary Insurance: 1D#: Group #:  Secondary Insurance: 1D#: Group #:  SCHEDULE INFORMATION: Date: 07/12/21 Time: Location: [place]

## 2021-06-14 ENCOUNTER — Other Ambulatory Visit: Payer: Self-pay

## 2021-06-14 ENCOUNTER — Ambulatory Visit
Admission: RE | Admit: 2021-06-14 | Discharge: 2021-06-14 | Disposition: A | Discharge status: Home / Self Care | Payer: Medicare Other | Source: Ambulatory Visit | Attending: Internal Medicine | Admitting: Internal Medicine

## 2021-06-14 DIAGNOSIS — Z1231 Encounter for screening mammogram for malignant neoplasm of breast: Secondary | ICD-10-CM | POA: Diagnosis not present

## 2021-06-20 ENCOUNTER — Ambulatory Visit (INDEPENDENT_AMBULATORY_CARE_PROVIDER_SITE_OTHER): Payer: Medicare Other

## 2021-06-20 ENCOUNTER — Other Ambulatory Visit: Payer: Self-pay

## 2021-06-20 VITALS — BP 138/84 | HR 86 | Temp 98.2°F | Resp 16 | Ht 61.0 in | Wt 153.0 lb

## 2021-06-20 DIAGNOSIS — Z0000 Encounter for general adult medical examination without abnormal findings: Secondary | ICD-10-CM

## 2021-06-20 NOTE — Progress Notes (Signed)
Subjective:   Bethany Martinez is a 75 y.o. female who presents for Medicare Annual (Subsequent) preventive examination.  Review of Systems     Cardiac Risk Factors include: advanced age (>53men, >53 women);diabetes mellitus;dyslipidemia;hypertension     Objective:    Today's Vitals   06/20/21 1118  BP: 138/84  Pulse: 86  Resp: 16  Temp: 98.2 F (36.8 C)  TempSrc: Oral  SpO2: 98%  Weight: 153 lb (69.4 kg)  Height: 5\' 1"  (1.549 m)   Body mass index is 28.91 kg/m.  Advanced Directives 06/20/2021 05/10/2020 05/10/2019 01/19/2018 12/05/2015  Does Patient Have a Medical Advance Directive? Yes Yes No Yes Yes  Type of 02/04/2016 of [place];Living will Healthcare Power of [place];Living will - Healthcare Power of [place];Living will -  Copy of Healthcare Power of Attorney in Chart? No - copy requested No - copy requested - No - copy requested No - copy requested  Would patient like information on creating a medical advance directive? - - No - Patient declined - -    Current Medications (verified) Outpatient Encounter Medications as of 06/20/2021  Medication Sig   amLODipine (NORVASC) 5 MG tablet Take 1 tablet (5 mg total) by mouth daily.   aspirin EC 81 MG tablet Take 81 mg by mouth daily. Patient taking 3 times per week   Calcium-Phosphorus-Vitamin D (CALCIUM/D3 ADULT GUMMIES PO) Take by mouth.   hydrochlorothiazide (HYDRODIURIL) 25 MG tablet TAKE 1 TABLET (25 MG TOTAL) BY MOUTH DAILY.   latanoprost (XALATAN) 0.005 % ophthalmic solution 1 drop at bedtime.   losartan (COZAAR) 100 MG tablet TAKE 1 TABLET BY MOUTH EVERY DAY   Multiple Vitamin (MULTI-VITAMIN PO) Take by mouth daily.   rosuvastatin (CRESTOR) 5 MG tablet TAKE 1 TABLET BY MOUTH 3 TIMES A WEEK.   triamcinolone ointment (KENALOG) 0.1 % Apply 1 application topically 2 (two) times daily. To knees and elbows   TURMERIC CURCUMIN PO Take by mouth.   [DISCONTINUED] Na Sulfate-K Sulfate-Mg Sulf  17.5-3.13-1.6 GM/177ML SOLN USE AS DIRECTED   [DISCONTINUED] Potassium 99 MG TABS Take by mouth.   [DISCONTINUED] Sod Picosulfate-Mag Ox-Cit Acd (CLENPIQ) 10-3.5-12 MG-GM -GM/160ML SOLN Take 1 kit by mouth as directed. At 5 PM evening before procedure, drink 1 bottle of Clenpiq, hydrate, drink (5) 8 oz of water. Then do the same thing 5 hours prior to your procedure.   No facility-administered encounter medications on file as of 06/20/2021.    Allergies (verified) Atorvastatin   History: Past Medical History:  Diagnosis Date   Allergy    Diabetes mellitus without complication (HCC)    Encounter for dental exam and cleaning w/o abnormal findings 05/18/2018   Glaucoma    Hyperlipidemia    Hypertension    Past Surgical History:  Procedure Laterality Date   ABDOMINAL HYSTERECTOMY  1988   partial   COLONOSCOPY  01/2009   Dr. 02/2009   Family History  Problem Relation Age of Onset   Hypertension Mother    Hypertension Father    Breast cancer Sister 76   Cancer Sister        colon   Dementia Sister    Breast cancer Paternal Aunt 32   Heart disease Brother        CABG   Social History   Socioeconomic History   Marital status: Divorced    Spouse name: Not on file   Number of children: 2   Years of education: some college   Highest education level:  12th grade  Occupational History   Occupation: Retired  Tobacco Use   Smoking status: Never   Smokeless tobacco: Never   Tobacco comments:    smoking cessation materials not required  Vaping Use   Vaping Use: Never used  Substance and Sexual Activity   Alcohol use: No    Alcohol/week: 0.0 standard drinks   Drug use: No   Sexual activity: Not Currently  Other Topics Concern   Not on file  Social History Narrative   Pt lives alone. Sister with dementia passed away Feb 20, 2020.    Social Determinants of Health   Financial Resource Strain: Low Risk    Difficulty of Paying Living Expenses: Not hard at all  Food  Insecurity: No Food Insecurity   Worried About Charity fundraiser in the Last Year: Never true   [place] in the Last Year: Never true  Transportation Needs: No Transportation Needs   Lack of Transportation (Medical): No   Lack of Transportation (Non-Medical): No  Physical Activity: Sufficiently Active   Days of Exercise per Week: 7 days   Minutes of Exercise per Session: 30 min  Stress: No Stress Concern Present   Feeling of Stress : Not at all  Social Connections: Moderately Isolated   Frequency of Communication with Friends and Family: More than three times a week   Frequency of Social Gatherings with Friends and Family: Three times a week   Attends Religious Services: More than 4 times per year   Active Member of Clubs or Organizations: No   Attends Archivist Meetings: Never   Marital Status: Divorced    Tobacco Counseling Counseling given: Not Answered Tobacco comments: smoking cessation materials not required   Clinical Intake:  Pre-visit preparation completed: Yes  Pain : No/denies pain     BMI - recorded: 28.91 Nutritional Status: BMI 25 -29 Overweight Nutritional Risks: None Diabetes: Yes CBG done?: No Did pt. bring in CBG monitor from home?: No  How often do you need to have someone help you when you read instructions, pamphlets, or other written materials from your doctor or pharmacy?: 1 - Never    Interpreter Needed?: No  Information entered by :: Clemetine Marker LPN   Activities of Daily Living In your present state of health, do you have any difficulty performing the following activities: 06/20/2021 05/22/2021  Hearing? N N  Vision? N N  Difficulty concentrating or making decisions? N N  Walking or climbing stairs? N N  Dressing or bathing? N N  Doing errands, shopping? N N  Preparing Food and eating ? N -  Using the Toilet? N -  In the past six months, have you accidently leaked urine? N -  Do you have problems with loss of  bowel control? N -  Managing your Medications? N -  Managing your Finances? N -  Housekeeping or managing your Housekeeping? N -  Some recent data might be hidden    Patient Care Team: Glean Hess, MD as PCP - General (Internal Medicine) Eulogio Bear, MD as Consulting Physician (Ophthalmology) Margaretha Sheffield, MD (Otolaryngology)  Indicate any recent Medical Services you may have received from other than [facility] providers in the past year (date may be approximate).     Assessment:   This is a routine wellness examination for Bethany Martinez.  Hearing/Vision screen Hearing Screening - Comments:: Pt denies hearing difficulty Vision Screening - Comments:: Annual vision screenings at [place] issues and  exercise activities discussed: Current Exercise Habits: Home exercise routine, Type of exercise: walking;Other - see comments (stationay bike), Time (Minutes): 30, Frequency (Times/Week): 7, Weekly Exercise (Minutes/Week): 210, Intensity: Moderate, Exercise limited by: None identified   Goals Addressed             This Visit's Progress    DIET - INCREASE WATER INTAKE   On track    Recommend to drink at least 6-8 8oz glasses of water per day.       Depression Screen PHQ 2/9 Scores 06/20/2021 05/22/2021 04/06/2021 11/21/2020 05/18/2020 05/10/2020 09/23/2019  PHQ - 2 Score 0 0 0 0 0 0 0  PHQ- 9 Score 0 0 1 0 0 - 4    Fall Risk Fall Risk  06/20/2021 05/22/2021 04/06/2021 11/21/2020 05/18/2020  Falls in the past year? 0 0 0 0 0  Number falls in past yr: 0 0 0 - 0  Injury with Fall? 0 0 0 - 0  Risk for fall due to : No Fall Risks No Fall Risks No Fall Risks - No Fall Risks  Risk for fall due to: Comment - - - - -  Follow up Falls prevention discussed Falls evaluation completed Falls evaluation completed Falls evaluation completed Falls evaluation completed    FALL RISK PREVENTION PERTAINING TO THE HOME:  Any stairs in or around the home? Yes  If so, are there any  without handrails? No  Home free of loose throw rugs in walkways, pet beds, electrical cords, etc? Yes  Adequate lighting in your home to reduce risk of falls? Yes   ASSISTIVE DEVICES UTILIZED TO PREVENT FALLS:  Life alert? No  Use of a cane, walker or w/c? No  Grab bars in the bathroom? Yes  Shower chair or bench in shower? Yes  Elevated toilet seat or a handicapped toilet? Yes   TIMED UP AND GO:  Was the test performed? Yes .  Length of time to ambulate 10 feet: 5 sec.   Gait steady and fast without use of assistive device  Cognitive Function: Normal cognitive status assessed by direct observation by this Nurse Health Advisor. No abnormalities found.       6CIT Screen 05/10/2020 05/10/2019 01/19/2018 01/16/2017  What Year? 0 points 0 points 0 points 0 points  What month? 0 points 0 points 0 points 0 points  What time? 0 points 0 points 0 points 0 points  Count back from 20 0 points 0 points 0 points 0 points  Months in reverse 4 points 2 points 0 points 0 points  Repeat phrase 0 points 4 points 2 points 0 points  Total Score $RemoveBef'4 6 2 '[id number]$ 0    Immunizations Immunization History  Administered Date(s) Administered   Fluad Quad(high Dose 65+) 05/10/2019, 05/10/2020, 04/06/2021   Influenza, High Dose Seasonal PF 05/23/2017, 04/17/2018   Influenza-Unspecified 05/11/2015, 05/23/2017, 06/22/2019   Moderna Sars-Covid-2 Vaccination 09/20/2019, 10/18/2019, 06/27/2020   Pneumococcal Conjugate-13 06/07/2015   Pneumococcal Polysaccharide-23 12/01/2012   Tdap 11/27/2009   Zoster Recombinat (Shingrix) 06/22/2019, 01/27/2020   Zoster, Live 01/11/2011    TDAP status: Due, Education has been provided regarding the importance of this vaccine. Advised may receive this vaccine at local pharmacy or Health Dept. Aware to provide a copy of the vaccination record if obtained from local pharmacy or Health Dept. Verbalized acceptance and understanding.  Flu Vaccine status: Up to date  Pneumococcal  vaccine status: Up to date  Covid-19 vaccine status: Completed vaccines  Qualifies for Shingles Vaccine? Yes  Zostavax completed Yes   Shingrix Completed?: Yes  Screening Tests Health Maintenance  Topic Date Due   COVID-19 Vaccine (4 - Booster for Moderna series) 08/22/2020   TETANUS/TDAP  11/21/2021 (Originally 11/28/2019)   COLON CANCER SCREENING ANNUAL FOBT  06/26/2021   MAMMOGRAM  06/14/2022   Pneumonia Vaccine 29+ Years old  Completed   INFLUENZA VACCINE  Completed   DEXA SCAN  Completed   Hepatitis C Screening  Completed   Zoster Vaccines- Shingrix  Completed   HPV VACCINES  Aged Out   COLONOSCOPY (Pts 45-51yrs Insurance coverage will need to be confirmed)  Discontinued    Health Maintenance  Health Maintenance Due  Topic Date Due   COVID-19 Vaccine (4 - Booster for Moderna series) 08/22/2020    Colorectal cancer screening: Type of screening: Colonoscopy. Completed 210. Repeat every 10 years. Pt scheduled for colonoscopy 07/12/21  Mammogram status: Completed 06/14/21. Repeat every year  Bone Density status: Completed 06/13/20. Results reflect: Bone density results: OSTEOPOROSIS. Repeat every 2 years.  Lung Cancer Screening: (Low Dose CT Chest recommended if Age 66-80 years, 30 pack-year currently smoking OR have quit w/in 15years.) does not qualify.   Additional Screening:  Hepatitis C Screening: does qualify; Completed 12/05/15  Vision Screening: Recommended annual ophthalmology exams for early detection of glaucoma and other disorders of the eye. Is the patient up to date with their annual eye exam?  Yes  Who is the provider or what is the name of the office in which the patient attends annual eye exams? Dr. Ellin Mayhew  Dental Screening: Recommended annual dental exams for proper oral hygiene  Community Resource Referral / Chronic Care Management: CRR required this visit?  No   CCM required this visit?  No      Plan:     I have personally reviewed and  noted the following in the patient's chart:   Medical and social history Use of alcohol, tobacco or illicit drugs  Current medications and supplements including opioid prescriptions.  Functional ability and status Nutritional status Physical activity Advanced directives List of other physicians Hospitalizations, surgeries, and ER visits in previous 12 months Vitals Screenings to include cognitive, depression, and falls Referrals and appointments  In addition, I have reviewed and discussed with patient certain preventive protocols, quality metrics, and best practice recommendations. A written personalized care plan for preventive services as well as general preventive health recommendations were provided to patient.     Clemetine Marker, LPN   [id number]   Nurse Notes: none

## 2021-06-20 NOTE — Patient Instructions (Signed)
[name] , Thank you for taking time to come for your Medicare Wellness Visit. I appreciate your ongoing commitment to your health goals. Please review the following plan we discussed and let me know if I can assist you in the future.   Screening recommendations/referrals: Colonoscopy: done 2010. Scheduled for 07/12/21 Mammogram: done 06/14/21 Bone Density: done 06/13/20 Recommended yearly ophthalmology/optometry visit for glaucoma screening and checkup Recommended yearly dental visit for hygiene and checkup  Vaccinations: Influenza vaccine: done 04/06/21 Pneumococcal vaccine: done 06/07/15 Tdap vaccine: due Shingles vaccine: done 06/22/19 & 01/27/20   Covid-19:done 09/20/19, 10/18/19 & 06/27/20  Advanced directives: Please bring a copy of your health care power of attorney and living will to the office at your convenience.   Conditions/risks identified: Keep up the great work!  Next appointment: Follow up in one year for your annual wellness visit    Preventive Care 65 Years and Older, Female Preventive care refers to lifestyle choices and visits with your health care provider that can promote health and wellness. What does preventive care include? A yearly physical exam. This is also called an annual well check. Dental exams once or twice a year. Routine eye exams. Ask your health care provider how often you should have your eyes checked. Personal lifestyle choices, including: Daily care of your teeth and gums. Regular physical activity. Eating a healthy diet. Avoiding tobacco and drug use. Limiting alcohol use. Practicing safe sex. Taking low-dose aspirin every day. Taking vitamin and mineral supplements as recommended by your health care provider. What happens during an annual well check? The services and screenings done by your health care provider during your annual well check will depend on your age, overall health, lifestyle risk factors, and family history of  disease. Counseling  Your health care provider may ask you questions about your: Alcohol use. Tobacco use. Drug use. Emotional well-being. Home and relationship well-being. Sexual activity. Eating habits. History of falls. Memory and ability to understand (cognition). Work and work Statistician. Reproductive health. Screening  You may have the following tests or measurements: Height, weight, and BMI. Blood pressure. Lipid and cholesterol levels. These may be checked every 5 years, or more frequently if you are over 43 years old. Skin check. Lung cancer screening. You may have this screening every year starting at age 58 if you have a 30-pack-year history of smoking and currently smoke or have quit within the past 15 years. Fecal occult blood test (FOBT) of the stool. You may have this test every year starting at age 74. Flexible sigmoidoscopy or colonoscopy. You may have a sigmoidoscopy every 5 years or a colonoscopy every 10 years starting at age 30. Hepatitis C blood test. Hepatitis B blood test. Sexually transmitted disease (STD) testing. Diabetes screening. This is done by checking your blood sugar (glucose) after you have not eaten for a while (fasting). You may have this done every 1-3 years. Bone density scan. This is done to screen for osteoporosis. You may have this done starting at age 81. Mammogram. This may be done every 1-2 years. Talk to your health care provider about how often you should have regular mammograms. Talk with your health care provider about your test results, treatment options, and if necessary, the need for more tests. Vaccines  Your health care provider may recommend certain vaccines, such as: Influenza vaccine. This is recommended every year. Tetanus, diphtheria, and acellular pertussis (Tdap, Td) vaccine. You may need a Td booster every 10 years. Zoster vaccine. You may need  this after age 25. Pneumococcal 13-valent conjugate (PCV13) vaccine. One  dose is recommended after age 92. Pneumococcal polysaccharide (PPSV23) vaccine. One dose is recommended after age 64. Talk to your health care provider about which screenings and vaccines you need and how often you need them. This information is not intended to replace advice given to you by your health care provider. Make sure you discuss any questions you have with your health care provider. Document Released: 08/11/2015 Document Revised: 04/03/2016 Document Reviewed: 05/16/2015 Elsevier Interactive Patient Education  2017 [place] Prevention in the Home Falls can cause injuries. They can happen to people of all ages. There are many things you can do to make your home safe and to help prevent falls. What can I do on the outside of my home? Regularly fix the edges of walkways and driveways and fix any cracks. Remove anything that might make you trip as you walk through a door, such as a raised step or threshold. Trim any bushes or trees on the path to your home. Use bright outdoor lighting. Clear any walking paths of anything that might make someone trip, such as rocks or tools. Regularly check to see if handrails are loose or broken. Make sure that both sides of any steps have handrails. Any raised decks and porches should have guardrails on the edges. Have any leaves, snow, or ice cleared regularly. Use sand or salt on walking paths during winter. Clean up any spills in your garage right away. This includes oil or grease spills. What can I do in the bathroom? Use night lights. Install grab bars by the toilet and in the tub and shower. Do not use towel bars as grab bars. Use non-skid mats or decals in the tub or shower. If you need to sit down in the shower, use a plastic, non-slip stool. Keep the floor dry. Clean up any water that spills on the floor as soon as it happens. Remove soap buildup in the tub or shower regularly. Attach bath mats securely with double-sided  non-slip rug tape. Do not have throw rugs and other things on the floor that can make you trip. What can I do in the bedroom? Use night lights. Make sure that you have a light by your bed that is easy to reach. Do not use any sheets or blankets that are too big for your bed. They should not hang down onto the floor. Have a firm chair that has side arms. You can use this for support while you get dressed. Do not have throw rugs and other things on the floor that can make you trip. What can I do in the kitchen? Clean up any spills right away. Avoid walking on wet floors. Keep items that you use a lot in easy-to-reach places. If you need to reach something above you, use a strong step stool that has a grab bar. Keep electrical cords out of the way. Do not use floor polish or wax that makes floors slippery. If you must use wax, use non-skid floor wax. Do not have throw rugs and other things on the floor that can make you trip. What can I do with my stairs? Do not leave any items on the stairs. Make sure that there are handrails on both sides of the stairs and use them. Fix handrails that are broken or loose. Make sure that handrails are as long as the stairways. Check any carpeting to make sure that it is firmly attached to  the stairs. Fix any carpet that is loose or worn. Avoid having throw rugs at the top or bottom of the stairs. If you do have throw rugs, attach them to the floor with carpet tape. Make sure that you have a light switch at the top of the stairs and the bottom of the stairs. If you do not have them, ask someone to add them for you. What else can I do to help prevent falls? Wear shoes that: Do not have high heels. Have rubber bottoms. Are comfortable and fit you well. Are closed at the toe. Do not wear sandals. If you use a stepladder: Make sure that it is fully opened. Do not climb a closed stepladder. Make sure that both sides of the stepladder are locked into place. Ask  someone to hold it for you, if possible. Clearly mark and make sure that you can see: Any grab bars or handrails. First and last steps. Where the edge of each step is. Use tools that help you move around (mobility aids) if they are needed. These include: Canes. Walkers. Scooters. Crutches. Turn on the lights when you go into a dark area. Replace any light bulbs as soon as they burn out. Set up your furniture so you have a clear path. Avoid moving your furniture around. If any of your floors are uneven, fix them. If there are any pets around you, be aware of where they are. Review your medicines with your doctor. Some medicines can make you feel dizzy. This can increase your chance of falling. Ask your doctor what other things that you can do to help prevent falls. This information is not intended to replace advice given to you by your health care provider. Make sure you discuss any questions you have with your health care provider. Document Released: 05/11/2009 Document Revised: 12/21/2015 Document Reviewed: 08/19/2014 Elsevier Interactive Patient Education  2017 [employer].

## 2021-07-03 ENCOUNTER — Encounter: Payer: Self-pay | Admitting: Gastroenterology

## 2021-07-12 ENCOUNTER — Encounter
Admission: RE | Disposition: A | Discharge status: Home / Self Care | Payer: Self-pay | Source: Home / Self Care | Attending: Gastroenterology

## 2021-07-12 ENCOUNTER — Ambulatory Visit: Payer: Medicare Other | Admitting: Anesthesiology

## 2021-07-12 ENCOUNTER — Ambulatory Visit
Admission: RE | Admit: 2021-07-12 | Discharge: 2021-07-12 | Disposition: A | Discharge status: Home / Self Care | Payer: Medicare Other | Attending: Gastroenterology | Admitting: Gastroenterology

## 2021-07-12 ENCOUNTER — Other Ambulatory Visit: Payer: Self-pay

## 2021-07-12 ENCOUNTER — Encounter: Payer: Self-pay | Admitting: Gastroenterology

## 2021-07-12 DIAGNOSIS — D124 Benign neoplasm of descending colon: Secondary | ICD-10-CM | POA: Diagnosis not present

## 2021-07-12 DIAGNOSIS — K635 Polyp of colon: Secondary | ICD-10-CM

## 2021-07-12 DIAGNOSIS — Z1211 Encounter for screening for malignant neoplasm of colon: Secondary | ICD-10-CM | POA: Diagnosis not present

## 2021-07-12 DIAGNOSIS — K5730 Diverticulosis of large intestine without perforation or abscess without bleeding: Secondary | ICD-10-CM | POA: Diagnosis not present

## 2021-07-12 DIAGNOSIS — Z800 Family history of malignant neoplasm of digestive organs: Secondary | ICD-10-CM

## 2021-07-12 HISTORY — PX: POLYPECTOMY: SHX5525

## 2021-07-12 HISTORY — PX: COLONOSCOPY WITH PROPOFOL: SHX5780

## 2021-07-12 SURGERY — COLONOSCOPY WITH PROPOFOL
Anesthesia: General | Site: Rectum

## 2021-07-12 MED ORDER — SODIUM CHLORIDE 0.9 % IV SOLN: INTRAVENOUS | Status: DC

## 2021-07-12 MED ORDER — PROPOFOL 10 MG/ML IV BOLUS
INTRAVENOUS | Status: DC | PRN
  Administered 2021-07-12 (×3): 40 mg via INTRAVENOUS
  Administered 2021-07-12: 100 mg via INTRAVENOUS
  Administered 2021-07-12: 40 mg via INTRAVENOUS

## 2021-07-12 MED ORDER — LACTATED RINGERS IV SOLN: INTRAVENOUS | Status: DC

## 2021-07-12 MED ORDER — STERILE WATER FOR IRRIGATION IR SOLN
Status: DC | PRN
  Administered 2021-07-12: 1

## 2021-07-12 MED ORDER — LIDOCAINE HCL (CARDIAC) PF 100 MG/5ML IV SOSY
PREFILLED_SYRINGE | INTRAVENOUS | Status: DC | PRN
  Administered 2021-07-12: 30 mg via INTRAVENOUS

## 2021-07-12 SURGICAL SUPPLY — 8 items
GOWN CVR UNV OPN BCK APRN NK (MISCELLANEOUS) ×2 IMPLANT
GOWN ISOL THUMB LOOP REG UNIV (MISCELLANEOUS) ×4
KIT PRC NS LF DISP ENDO (KITS) ×1 IMPLANT
KIT PROCEDURE OLYMPUS (KITS) ×2
MANIFOLD NEPTUNE II (INSTRUMENTS) ×2 IMPLANT
SNARE COLD EXACTO (MISCELLANEOUS) ×1 IMPLANT
TRAP ETRAP POLY (MISCELLANEOUS) ×1 IMPLANT
WATER STERILE IRR 250ML POUR (IV SOLUTION) ×2 IMPLANT

## 2021-07-12 NOTE — H&P (Signed)
Bethany Darby, MD [address]  [place]  [place], [place] [address]  Main: [phone]  Fax: [phone] Pager: [phone]  Primary Care Physician:  Glean Hess, MD Primary Gastroenterologist:  Dr. Cephas Martinez  Pre-Procedure History & Physical: HPI:  Bethany Martinez is a 75 y.o. female is here for an colonoscopy.   Past Medical History:  Diagnosis Date   Allergy    Diabetes mellitus without complication ([place])    Encounter for dental exam and cleaning w/o abnormal findings 05/18/2018   Glaucoma    Hyperlipidemia    Hypertension     Past Surgical History:  Procedure Laterality Date   ABDOMINAL HYSTERECTOMY  1988   partial   COLONOSCOPY  February 11, 2009   Dr. Jamal Collin    Prior to Admission medications   Medication Sig Start Date End Date Taking? Authorizing Provider  amLODipine (NORVASC) 5 MG tablet Take 1 tablet (5 mg total) by mouth daily. 05/22/21  Yes Glean Hess, MD  aspirin EC 81 MG tablet Take 81 mg by mouth daily. Patient taking 3 times per week   Yes [provider]  Calcium-Phosphorus-Vitamin D (CALCIUM/D3 ADULT GUMMIES PO) Take by mouth.   Yes [provider]  hydrochlorothiazide (HYDRODIURIL) 25 MG tablet TAKE 1 TABLET (25 MG TOTAL) BY MOUTH DAILY. 05/04/21  Yes Glean Hess, MD  latanoprost (XALATAN) 0.005 % ophthalmic solution 1 drop at bedtime.   Yes [provider]  losartan (COZAAR) 100 MG tablet TAKE 1 TABLET BY MOUTH EVERY DAY 05/10/21  Yes Glean Hess, MD  Multiple Vitamin (MULTI-VITAMIN PO) Take by mouth daily.   Yes [provider]  rosuvastatin (CRESTOR) 5 MG tablet TAKE 1 TABLET BY MOUTH 3 TIMES A WEEK. 05/03/21  Yes Glean Hess, MD  triamcinolone ointment (KENALOG) 0.1 % Apply 1 application topically 2 (two) times daily. To knees and elbows 04/06/21  Yes Glean Hess, MD  TURMERIC CURCUMIN PO Take by mouth.   Yes [provider]    Allergies as of 05/23/2021 -  Review Complete 05/22/2021  Allergen Reaction Noted   Atorvastatin Other (See Comments) 11/21/2020    Family History  Problem Relation Age of Onset   Hypertension Mother    Hypertension Father    Breast cancer Sister 77   Cancer Sister        colon   Dementia Sister    Breast cancer Paternal Aunt 100   Heart disease Brother        CABG    Social History   Socioeconomic History   Marital status: Divorced    Spouse name: Not on file   Number of children: 2   Years of education: some college   Highest education level: 12th grade  Occupational History   Occupation: Retired  Tobacco Use   Smoking status: Never   Smokeless tobacco: Never   Tobacco comments:    smoking cessation materials not required  Vaping Use   Vaping Use: Never used  Substance and Sexual Activity   Alcohol use: No    Alcohol/week: 0.0 standard drinks   Drug use: No   Sexual activity: Not Currently  Other Topics Concern   Not on file  Social History Narrative   Pt lives alone. Sister with dementia passed away Feb 12, 2020.    Social Determinants of Health   Financial Resource Strain: Low Risk    Difficulty of Paying Living Expenses: Not hard at all  Food Insecurity: No Food Insecurity  Worried About Charity fundraiser in the Last Year: Never true   [place] in the Last Year: Never true  Transportation Needs: No Transportation Needs   Lack of Transportation (Medical): No   Lack of Transportation (Non-Medical): No  Physical Activity: Sufficiently Active   Days of Exercise per Week: 7 days   Minutes of Exercise per Session: 30 min  Stress: No Stress Concern Present   Feeling of Stress : Not at all  Social Connections: Moderately Isolated   Frequency of Communication with Friends and Family: More than three times a week   Frequency of Social Gatherings with Friends and Family: Three times a week   Attends Religious Services: More than 4 times per year   Active Member of Clubs or  Organizations: No   Attends Archivist Meetings: Never   Marital Status: Divorced  Human resources officer Violence: Not At Risk   Fear of Current or Ex-Partner: No   Emotionally Abused: No   Physically Abused: No   Sexually Abused: No    Review of Systems: See HPI, otherwise negative ROS  Physical Exam: BP (!) 142/83    Pulse (!) 111    Temp 98.1 F (36.7 C) (Temporal)    Ht 5\' 1"  (1.549 m)    Wt 68.2 kg    BMI 28.40 kg/m  General:   Alert,  pleasant and cooperative in NAD Head:  Normocephalic and atraumatic. Neck:  Supple; no masses or thyromegaly. Lungs:  Clear throughout to auscultation.    Heart:  Regular rate and rhythm. Abdomen:  Soft, nontender and nondistended. Normal bowel sounds, without guarding, and without rebound.   Neurologic:  Alert and  oriented x4;  grossly normal neurologically.  Impression/Plan: Bethany Martinez is here for an colonoscopy to be performed for colon cancer screening  Risks, benefits, limitations, and alternatives regarding  colonoscopy have been reviewed with the patient.  Questions have been answered.  All parties agreeable.   Sherri Sear, MD  07/12/2021, 7:47 AM

## 2021-07-12 NOTE — Op Note (Signed)
[facility] Patient Name: Bethany Martinez Procedure Date: 07/12/2021 8:46 AM MRN: [id number] Account #: [id number] Date of Birth: 1946-03-15 Admit Type: Outpatient Age: 75 Room: [facility] OR ROOM 01 Gender: Female Note Status: Finalized Instrument Name: [id number] Procedure:             Colonoscopy Indications:           Screening for colorectal malignant neoplasm, Last                         colonoscopy: July 2010 Providers:             Lin Landsman MD, MD Referring MD:          Halina Maidens, MD (Referring MD) Medicines:             General Anesthesia Complications:         No immediate complications. Estimated blood loss: None. Procedure:             Pre-Anesthesia Assessment:                        - Prior to the procedure, a History and Physical was                         performed, and patient medications and allergies were                         reviewed. The patient is competent. The risks and                         benefits of the procedure and the sedation options and                         risks were discussed with the patient. All questions                         were answered and informed consent was obtained.                         Patient identification and proposed procedure were                         verified by the physician, the nurse, the                         anesthesiologist, the anesthetist and the technician                         in the pre-procedure area in the procedure room in the                         endoscopy suite. Mental Status Examination: alert and                         oriented. Airway Examination: normal oropharyngeal                         airway and neck mobility. Respiratory Examination:  clear to auscultation. CV Examination: normal.                         Prophylactic Antibiotics: The patient does not require                         prophylactic antibiotics. Prior  Anticoagulants: The                         patient has taken no previous anticoagulant or                         antiplatelet agents. ASA Grade Assessment: II - A                         patient with mild systemic disease. After reviewing                         the risks and benefits, the patient was deemed in                         satisfactory condition to undergo the procedure. The                         anesthesia plan was to use general anesthesia.                         Immediately prior to administration of medications,                         the patient was re-assessed for adequacy to receive                         sedatives. The heart rate, respiratory rate, oxygen                         saturations, blood pressure, adequacy of pulmonary                         ventilation, and response to care were monitored                         throughout the procedure. The physical status of the                         patient was re-assessed after the procedure.                        After obtaining informed consent, the colonoscope was                         passed under direct vision. Throughout the procedure,                         the patient's blood pressure, pulse, and oxygen                         saturations were monitored continuously. The  Colonoscope was introduced through the anus and                         advanced to the the cecum, identified by appendiceal                         orifice and ileocecal valve. The colonoscopy was                         performed without difficulty. The patient tolerated                         the procedure well. The quality of the bowel                         preparation was evaluated using the BBPS [facility] Bowel                         Preparation Scale) with scores of: Right Colon = 3,                         Transverse Colon = 3 and Left Colon = 3 (entire mucosa                         seen well with no  residual staining, small fragments                         of stool or opaque liquid). The total BBPS score                         equals 9. Findings:      The perianal and digital rectal examinations were normal. Pertinent       negatives include normal sphincter tone and no palpable rectal lesions.      Two sessile polyps were found in the descending colon. The polyps were 3       to 4 mm in size. These polyps were removed with a cold snare. Resection       and retrieval were complete. Estimated blood loss: none.      Multiple diverticula were found in the recto-sigmoid colon, sigmoid       colon, descending colon and ascending colon. There was no evidence of       diverticular bleeding.      The retroflexed view of the distal rectum and anal verge was normal and       showed no anal or rectal abnormalities. Impression:            - Two 3 to 4 mm polyps in the descending colon,                         removed with a cold snare. Resected and retrieved.                        - Moderate diverticulosis in the recto-sigmoid colon,                         in the sigmoid colon, in the descending colon and in  the ascending colon. There was no evidence of                         diverticular bleeding.                        - The distal rectum and anal verge are normal on                         retroflexion view. Recommendation:        - Discharge patient to home (with escort).                        - Resume previous diet today.                        - Continue present medications.                        - Await pathology results.                        - Repeat colonoscopy in 7 years for surveillance. Procedure Code(s):     --- Professional ---                        [id number], Colonoscopy, flexible; with removal of                         tumor(s), polyp(s), or other lesion(s) by snare                         technique Diagnosis Code(s):     --- Professional ---                         Z12.11, Encounter for screening for malignant neoplasm                         of colon                        K63.5, Polyp of colon                        K57.30, Diverticulosis of large intestine without                         perforation or abscess without bleeding CPT copyright 2019 American Medical Association. All rights reserved. The codes documented in this report are preliminary and upon coder review may  be revised to meet current compliance requirements. Dr. Ulyess Mort Lin Landsman MD, MD 07/12/2021 9:07:49 AM This report has been signed electronically. Number of Addenda: 0 Note Initiated On: 07/12/2021 8:46 AM Scope Withdrawal Time: 0 hours 9 minutes 24 seconds  Total Procedure Duration: 0 hours 12 minutes 26 seconds  Estimated Blood Loss:  Estimated blood loss: none.      [facility]

## 2021-07-12 NOTE — Anesthesia Procedure Notes (Signed)
Date/Time: 07/12/2021 9:01 AM Performed by: Cameron Ali, CRNA Pre-anesthesia Checklist: Patient identified, Emergency Drugs available, Suction available, Timeout performed and Patient being monitored Patient Re-evaluated:Patient Re-evaluated prior to induction Oxygen Delivery Method: Nasal cannula Placement Confirmation: positive ETCO2

## 2021-07-12 NOTE — Transfer of Care (Signed)
Immediate Anesthesia Transfer of Care Note  Patient: Bethany Martinez  Procedure(s) Performed: COLONOSCOPY WITH BIOPSY (Rectum) POLYPECTOMY (Rectum)  Patient Location: PACU  Anesthesia Type: General  Level of Consciousness: awake, alert  and patient cooperative  Airway and Oxygen Therapy: Patient Spontanous Breathing and Patient connected to supplemental oxygen  Post-op Assessment: Post-op Vital signs reviewed, Patient's Cardiovascular Status Stable, Respiratory Function Stable, Patent Airway and No signs of Nausea or vomiting  Post-op Vital Signs: Reviewed and stable  Complications: No notable events documented.

## 2021-07-12 NOTE — Anesthesia Postprocedure Evaluation (Signed)
Anesthesia Post Note  Patient: Bethany Martinez  Procedure(s) Performed: COLONOSCOPY WITH BIOPSY (Rectum) POLYPECTOMY (Rectum)     Patient location during evaluation: PACU Anesthesia Type: General Level of consciousness: awake and alert Pain management: pain level controlled Vital Signs Assessment: post-procedure vital signs reviewed and stable Respiratory status: spontaneous breathing, nonlabored ventilation and respiratory function stable Cardiovascular status: blood pressure returned to baseline and stable Postop Assessment: no apparent nausea or vomiting Anesthetic complications: no   No notable events documented.  April Manson

## 2021-07-12 NOTE — Anesthesia Preprocedure Evaluation (Addendum)
Anesthesia Evaluation  Patient identified by MRN, date of birth, ID band Patient awake    Reviewed: Allergy & Precautions, H&P , NPO status , Patient's Chart, lab work & pertinent test results, reviewed documented beta blocker date and time   Airway Mallampati: II  TM Distance: >3 FB Neck ROM: full    Dental no notable dental hx.    Pulmonary neg pulmonary ROS,    Pulmonary exam normal breath sounds clear to auscultation       Cardiovascular Exercise Tolerance: Good hypertension, negative cardio ROS   Rhythm:regular Rate:Normal     Neuro/Psych negative neurological ROS  negative psych ROS   GI/Hepatic negative GI ROS, Neg liver ROS,   Endo/Other  diabetes (pre-diabetic)  Renal/GU negative Renal ROS  negative genitourinary   Musculoskeletal   Abdominal   Peds  Hematology negative hematology ROS (+)   Anesthesia Other Findings   Reproductive/Obstetrics negative OB ROS                            Anesthesia Physical Anesthesia Plan  ASA: 2  Anesthesia Plan: General   Post-op Pain Management:    Induction:   PONV Risk Score and Plan:   Airway Management Planned:   Additional Equipment:   Intra-op Plan:   Post-operative Plan:   Informed Consent: I have reviewed the patients History and Physical, chart, labs and discussed the procedure including the risks, benefits and alternatives for the proposed anesthesia with the patient or authorized representative who has indicated his/her understanding and acceptance.     Dental Advisory Given  Plan Discussed with: CRNA  Anesthesia Plan Comments:        Anesthesia Quick Evaluation

## 2021-07-13 ENCOUNTER — Encounter: Payer: Self-pay | Admitting: Gastroenterology

## 2021-07-16 ENCOUNTER — Encounter: Payer: Self-pay | Admitting: Gastroenterology

## 2021-07-16 LAB — SURGICAL PATHOLOGY

## 2021-07-29 DIAGNOSIS — R059 Cough, unspecified: Secondary | ICD-10-CM | POA: Diagnosis not present

## 2021-07-29 DIAGNOSIS — U071 COVID-19: Secondary | ICD-10-CM | POA: Diagnosis not present

## 2021-07-29 DIAGNOSIS — Z20822 Contact with and (suspected) exposure to covid-19: Secondary | ICD-10-CM | POA: Diagnosis not present

## 2021-07-31 ENCOUNTER — Ambulatory Visit: Payer: Self-pay | Admitting: *Deleted

## 2021-07-31 NOTE — Telephone Encounter (Signed)
Chief Complaint: COVID positive test on 07/29/21 at [facility] Symptoms: Nasal congestion, slight cough, runny nose, diarrhea Frequency: Symptoms started on 07/27/21 Pertinent Negatives: Patient denies fever, SOB, other symptoms Disposition: [] ED /[] Urgent Care (no appt availability in office) / [x] Appointment(In office/virtual)/ []  [facility]/ [] Home Care/ [] Refused Recommended Disposition /[] [facility]/ []  Follow-up with PCP Additional Notes: N/A    Pt called back to speak with NT regarding COVID   ----- Message from Lionel December sent at 07/31/2021  9:06 AM EST -----  Patient tested positive for covid and needs to know what she can take  Reason for Disposition  [1] HIGH RISK for severe COVID complications (e.g., weak immune system, age > 54 years, obesity with BMI > 25, pregnant, chronic lung disease or other chronic medical condition) AND [2] COVID symptoms (e.g., cough, fever)  (Exceptions: Already seen by PCP and no new or worsening symptoms.)  Answer Assessment - Initial Assessment Questions 1. COVID-19 DIAGNOSIS: "Who made your COVID-19 diagnosis?" "Was it confirmed by a positive lab test or self-test?" If not diagnosed by a doctor (or NP/PA), ask "Are there lots of cases (community spread) where you live?" Note: See public health department website, if unsure.     [facility] in [place] on 07/29/21 2. COVID-19 EXPOSURE: "Was there any known exposure to COVID before the symptoms began?" CDC Definition of close contact: within 6 feet (2 meters) for a total of 15 minutes or more over a 24-hour period.      Daughter tested positive on 07/29/21, stayed with daughter a couple of nights when she was feeling bad a couple days after Christmas 3. ONSET: "When did the COVID-19 symptoms start?"      Friday 4. WORST SYMPTOM: "What is your worst symptom?" (e.g., cough, fever, shortness of breath, muscle aches)     Nasal congestion 5. COUGH: "Do you have a cough?" If Yes, ask:  "How bad is the cough?"       Yes, slight 6. FEVER: "Do you have a fever?" If Yes, ask: "What is your temperature, how was it measured, and when did it start?"     No 7. RESPIRATORY STATUS: "Describe your breathing?" (e.g., shortness of breath, wheezing, unable to speak)      No 8. BETTER-SAME-WORSE: "Are you getting better, staying the same or getting worse compared to yesterday?"  If getting worse, ask, "In what way?"     Same 9. HIGH RISK DISEASE: "Do you have any chronic medical problems?" (e.g., asthma, heart or lung disease, weak immune system, obesity, etc.)     N/A 10. VACCINE: "Have you had the COVID-19 vaccine?" If Yes, ask: "Which one, how many shots, when did you get it?"       N/A 11. BOOSTER: "Have you received your COVID-19 booster?" If Yes, ask: "Which one and when did you get it?"       N/A 12. PREGNANCY: "Is there any chance you are pregnant?" "When was your last menstrual period?"       N/A 13. OTHER SYMPTOMS: "Do you have any other symptoms?"  (e.g., chills, fatigue, headache, loss of smell or taste, muscle pain, sore throat)       Sneezing, runny nose occasionally, diarrhea 14. O2 SATURATION MONITOR:  "Do you use an oxygen saturation monitor (pulse oximeter) at home?" If Yes, ask "What is your reading (oxygen level) today?" "What is your usual oxygen saturation reading?" (e.g., 95%)       N/A  Protocols  used: Coronavirus (XJOIT-25) Diagnosed or Suspected-A-AH

## 2021-07-31 NOTE — Telephone Encounter (Signed)
I attempted to return her call.   She had called in earlier today regarding being positive for Covid and wanting to know what she could take. Left a message to call back.

## 2021-08-01 ENCOUNTER — Telehealth (INDEPENDENT_AMBULATORY_CARE_PROVIDER_SITE_OTHER): Payer: Medicare Other | Admitting: Internal Medicine

## 2021-08-01 ENCOUNTER — Telehealth: Payer: Self-pay

## 2021-08-01 ENCOUNTER — Other Ambulatory Visit: Payer: Self-pay

## 2021-08-01 ENCOUNTER — Encounter: Payer: Self-pay | Admitting: Internal Medicine

## 2021-08-01 VITALS — Ht 61.0 in

## 2021-08-01 DIAGNOSIS — U071 COVID-19: Secondary | ICD-10-CM | POA: Diagnosis not present

## 2021-08-01 MED ORDER — MOLNUPIRAVIR EUA 200MG CAPSULE: 4.0000 | ORAL_CAPSULE | Freq: Two times a day (BID) | ORAL | 0 refills | Status: AC

## 2021-08-01 NOTE — Progress Notes (Signed)
Date:  08/01/2021   Name:  Bethany Martinez   DOB:  Jan 27, 1946   MRN:  [id number]  This encounter was conducted via video encounter due to the need for social distancing in light of the Covid-19 pandemic.  The patient was correctly identified.  I advised that I am conducting the visit from a secure room in my office at [facility].  The patient is located at home. The limitations of this form of encounter were discussed with the patient and he/she agreed to proceed.  Some vital signs will be absent.  Chief Complaint: Covid Positive (Tested positive 07/29/2021, cough, nasal congestion, no fever )  Cough This is a new problem. The current episode started yesterday. The problem has been unchanged. The cough is Non-productive. Associated symptoms include nasal congestion and rhinorrhea. Pertinent negatives include no chest pain, chills, fever, headaches, shortness of breath, weight loss or wheezing.   Lab Results  Component Value Date   NA 142 05/22/2021   K 3.6 05/22/2021   CO2 27 05/22/2021   GLUCOSE 103 (H) 05/22/2021   BUN 13 05/22/2021   CREATININE 0.97 05/22/2021   CALCIUM 9.7 05/22/2021   EGFR 61 05/22/2021   GFRNONAA 67 05/18/2020   Lab Results  Component Value Date   CHOL 253 (H) 05/22/2021   HDL 52 05/22/2021   LDLCALC 175 (H) 05/22/2021   TRIG 141 05/22/2021   CHOLHDL 4.9 (H) 05/22/2021   Lab Results  Component Value Date   TSH 2.200 05/22/2021   Lab Results  Component Value Date   HGBA1C 6.2 (H) 05/22/2021   Lab Results  Component Value Date   WBC 5.4 05/22/2021   HGB 12.2 05/22/2021   HCT 36.6 05/22/2021   MCV 84 05/22/2021   PLT 308 05/22/2021   Lab Results  Component Value Date   ALT 19 05/22/2021   AST 20 05/22/2021   ALKPHOS 84 05/22/2021   BILITOT 0.6 05/22/2021   Lab Results  Component Value Date   VD25OH 23.0 (L) 05/18/2020     Review of Systems  Constitutional:  Negative for chills, fatigue, fever and weight loss.  HENT:   Positive for congestion, rhinorrhea and sneezing.   Respiratory:  Positive for cough. Negative for chest tightness, shortness of breath and wheezing.   Cardiovascular:  Negative for chest pain and leg swelling.  Gastrointestinal:  Positive for diarrhea. Negative for nausea and vomiting.  Neurological:  Negative for headaches.   Patient Active Problem List   Diagnosis Date Noted   Colon cancer screening    Polyp of descending colon    Age-related osteoporosis without fracture 05/18/2020   Mixed hyperlipidemia 05/18/2019   Eczema 07/18/2017   Prediabetes 06/07/2015   Edema extremities 01/11/2015   Allergic state 01/11/2015   Essential (primary) hypertension 01/11/2015    Allergies  Allergen Reactions   Atorvastatin Other (See Comments)    myalgia    Past Surgical History:  Procedure Laterality Date   ABDOMINAL HYSTERECTOMY  1988   partial   COLONOSCOPY  01/2009   Dr. Evette Cristal   COLONOSCOPY WITH PROPOFOL N/A 07/12/2021   Procedure: COLONOSCOPY WITH BIOPSY;  Surgeon: Toney Reil, MD;  Location: [facility];  Service: Endoscopy;  Laterality: N/A;   POLYPECTOMY N/A 07/12/2021   Procedure: POLYPECTOMY;  Surgeon: Toney Reil, MD;  Location: [facility];  Service: Endoscopy;  Laterality: N/A;    Social History   Tobacco Use   Smoking status: Never   Smokeless  tobacco: Never   Tobacco comments:    smoking cessation materials not required  Vaping Use   Vaping Use: Never used  Substance Use Topics   Alcohol use: No    Alcohol/week: 0.0 standard drinks   Drug use: No     Medication list has been reviewed and updated.  Current Meds  Medication Sig   amLODipine (NORVASC) 5 MG tablet Take 1 tablet (5 mg total) by mouth daily.   aspirin EC 81 MG tablet Take 81 mg by mouth daily. Patient taking 3 times per week   Calcium-Phosphorus-Vitamin D (CALCIUM/D3 ADULT GUMMIES PO) Take by mouth.   hydrochlorothiazide (HYDRODIURIL) 25 MG tablet TAKE 1  TABLET (25 MG TOTAL) BY MOUTH DAILY.   latanoprost (XALATAN) 0.005 % ophthalmic solution 1 drop at bedtime.   losartan (COZAAR) 100 MG tablet TAKE 1 TABLET BY MOUTH EVERY DAY   Multiple Vitamin (MULTI-VITAMIN PO) Take by mouth daily.   rosuvastatin (CRESTOR) 5 MG tablet TAKE 1 TABLET BY MOUTH 3 TIMES A WEEK.   triamcinolone ointment (KENALOG) 0.1 % Apply 1 application topically 2 (two) times daily. To knees and elbows   TURMERIC CURCUMIN PO Take by mouth.    PHQ 2/9 Scores 06/20/2021 05/22/2021 04/06/2021 11/21/2020  PHQ - 2 Score 0 0 0 0  PHQ- 9 Score 0 0 1 0    GAD 7 : Generalized Anxiety Score 05/22/2021 04/06/2021 11/21/2020 05/18/2020  Nervous, Anxious, on Edge 0 0 0 0  Control/stop worrying 0 0 0 0  Worry too much - different things 0 0 0 0  Trouble relaxing 0 0 0 0  Restless 0 0 0 0  Easily annoyed or irritable 0 0 0 0  Afraid - awful might happen 0 0 0 0  Total GAD 7 Score 0 0 0 0  Anxiety Difficulty Not difficult at all Not difficult at all - Not difficult at all    BP Readings from Last 3 Encounters:  07/12/21 104/66  06/20/21 138/84  05/22/21 122/70    Physical Exam Constitutional:      Appearance: Normal appearance.  Pulmonary:     Effort: Pulmonary effort is normal.     Comments: No cough or dyspnea noted during the call Neurological:     Mental Status: She is alert.  Psychiatric:        Attention and Perception: Attention normal.        Mood and Affect: Mood normal.        Speech: Speech normal.        Behavior: Behavior normal.        Cognition and Memory: Cognition normal.    Wt Readings from Last 3 Encounters:  07/12/21 150 lb 4.8 oz (68.2 kg)  06/20/21 153 lb (69.4 kg)  05/22/21 152 lb 6.4 oz (69.1 kg)    Ht $R'5\' 1"'yG$  (1.549 m)    BMI 28.40 kg/m   Assessment and Plan: 1. COVID-19 virus infection Take Tylenol 650 - 1000 mg every 6-8 hours for fever, body aches and headache. Drink plenty of fluids with electrolytes. Monitor for fever that does not  decrease and/or shortness of breath that worsens or is present at rest. Continue Benadryl as needed; cough drops. Follow up if no improvement. - molnupiravir EUA (LAGEVRIO) 200 mg CAPS capsule; Take 4 capsules (800 mg total) by mouth 2 (two) times daily for 5 days.  Dispense: 40 capsule; Refill: 0  I spent 6 minutes on this encounter,  100% of the time was via  video. Partially dictated using Editor, commissioning. Any errors are unintentional.  Halina Maidens, MD [facility]  08/01/2021

## 2021-08-03 NOTE — Telephone Encounter (Signed)
Error

## 2021-10-19 DIAGNOSIS — H401131 Primary open-angle glaucoma, bilateral, mild stage: Secondary | ICD-10-CM | POA: Diagnosis not present

## 2021-10-19 DIAGNOSIS — H1045 Other chronic allergic conjunctivitis: Secondary | ICD-10-CM | POA: Diagnosis not present

## 2021-10-19 DIAGNOSIS — H2513 Age-related nuclear cataract, bilateral: Secondary | ICD-10-CM | POA: Diagnosis not present

## 2021-10-20 ENCOUNTER — Other Ambulatory Visit: Payer: Self-pay | Admitting: Internal Medicine

## 2021-10-20 DIAGNOSIS — E782 Mixed hyperlipidemia: Secondary | ICD-10-CM

## 2021-10-23 NOTE — Telephone Encounter (Signed)
Requested Prescriptions  ?Pending Prescriptions Disp Refills  ?? rosuvastatin (CRESTOR) 5 MG tablet [Pharmacy Med Name: ROSUVASTATIN CALCIUM 5 MG TAB] 36 tablet 0  ?  Sig: TAKE 1 TABLET BY MOUTH THREE TIMES A WEEK  ?  ? Cardiovascular:  Antilipid - Statins 2 Failed - 10/20/2021 12:32 PM  ?  ?  Failed - Lipid Panel in normal range within the last 12 months  ?  Cholesterol, Total  ?Date Value Ref Range Status  ?05/22/2021 253 (H) 100 - 199 mg/dL Final  ? ?LDL Chol Calc (NIH)  ?Date Value Ref Range Status  ?05/22/2021 175 (H) 0 - 99 mg/dL Final  ? ?HDL  ?Date Value Ref Range Status  ?05/22/2021 52 >39 mg/dL Final  ? ?Triglycerides  ?Date Value Ref Range Status  ?05/22/2021 141 0 - 149 mg/dL Final  ? ?  ?  ?  Passed - Cr in normal range and within 360 days  ?  Creatinine, Ser  ?Date Value Ref Range Status  ?05/22/2021 0.97 0.57 - 1.00 mg/dL Final  ?   ?  ?  Passed - Patient is not pregnant  ?  ?  Passed - Valid encounter within last 12 months  ?  Recent Outpatient Visits   ?      ? 2 months ago COVID-19 virus infection  ? [facility] Glean Hess, MD  ? 5 months ago Annual physical exam  ? [facility] Glean Hess, MD  ? 6 months ago Essential (primary) hypertension  ? [facility] Glean Hess, MD  ? 11 months ago Essential (primary) hypertension  ? [facility] Glean Hess, MD  ? 1 year ago Annual physical exam  ? [facility] Glean Hess, MD  ?  ?  ?Future Appointments   ?        ? In 4 weeks Glean Hess, MD [facility], [place]  ?  ? ?  ?  ?  ? ? ?

## 2021-10-27 ENCOUNTER — Other Ambulatory Visit: Payer: Self-pay | Admitting: Internal Medicine

## 2021-10-27 DIAGNOSIS — I10 Essential (primary) hypertension: Secondary | ICD-10-CM

## 2021-10-29 NOTE — Telephone Encounter (Signed)
Requested Prescriptions  ?Pending Prescriptions Disp Refills  ?? losartan (COZAAR) 100 MG tablet [Pharmacy Med Name: LOSARTAN POTASSIUM 100 MG TAB] 90 tablet 1  ?  Sig: TAKE 1 TABLET BY MOUTH EVERY DAY  ?  ? Cardiovascular:  Angiotensin Receptor Blockers Passed - 10/27/2021  1:02 AM  ?  ?  Passed - Cr in normal range and within 180 days  ?  Creatinine, Ser  ?Date Value Ref Range Status  ?05/22/2021 0.97 0.57 - 1.00 mg/dL Final  ?   ?  ?  Passed - K in normal range and within 180 days  ?  Potassium  ?Date Value Ref Range Status  ?05/22/2021 3.6 3.5 - 5.2 mmol/L Final  ?   ?  ?  Passed - Patient is not pregnant  ?  ?  Passed - Last BP in normal range  ?  BP Readings from Last 1 Encounters:  ?07/12/21 104/66  ?   ?  ?  Passed - Valid encounter within last 6 months  ?  Recent Outpatient Visits   ?      ? 2 months ago COVID-19 virus infection  ? [facility] Glean Hess, MD  ? 5 months ago Annual physical exam  ? [facility] Glean Hess, MD  ? 6 months ago Essential (primary) hypertension  ? [facility] Glean Hess, MD  ? 11 months ago Essential (primary) hypertension  ? [facility] Glean Hess, MD  ? 1 year ago Annual physical exam  ? [facility] Glean Hess, MD  ?  ?  ?Future Appointments   ?        ? In 3 weeks Glean Hess, MD [facility], [place]  ?  ? ?  ?  ?  ? ?

## 2021-11-02 ENCOUNTER — Other Ambulatory Visit: Payer: Self-pay | Admitting: Internal Medicine

## 2021-11-02 DIAGNOSIS — I10 Essential (primary) hypertension: Secondary | ICD-10-CM

## 2021-11-02 NOTE — Telephone Encounter (Signed)
Requested Prescriptions  ?Pending Prescriptions Disp Refills  ?? hydrochlorothiazide (HYDRODIURIL) 25 MG tablet [Pharmacy Med Name: HYDROCHLOROTHIAZIDE 25 MG TAB] 90 tablet 1  ?  Sig: TAKE 1 TABLET (25 MG TOTAL) BY MOUTH DAILY.  ?  ? Cardiovascular: Diuretics - Thiazide Passed - 11/02/2021  2:28 AM  ?  ?  Passed - Cr in normal range and within 180 days  ?  Creatinine, Ser  ?Date Value Ref Range Status  ?05/22/2021 0.97 0.57 - 1.00 mg/dL Final  ?   ?  ?  Passed - K in normal range and within 180 days  ?  Potassium  ?Date Value Ref Range Status  ?05/22/2021 3.6 3.5 - 5.2 mmol/L Final  ?   ?  ?  Passed - Na in normal range and within 180 days  ?  Sodium  ?Date Value Ref Range Status  ?05/22/2021 142 134 - 144 mmol/L Final  ?   ?  ?  Passed - Last BP in normal range  ?  BP Readings from Last 1 Encounters:  ?07/12/21 104/66  ?   ?  ?  Passed - Valid encounter within last 6 months  ?  Recent Outpatient Visits   ?      ? 3 months ago COVID-19 virus infection  ? [facility] Glean Hess, MD  ? 5 months ago Annual physical exam  ? [facility] Glean Hess, MD  ? 7 months ago Essential (primary) hypertension  ? [facility] Glean Hess, MD  ? 11 months ago Essential (primary) hypertension  ? [facility] Glean Hess, MD  ? 1 year ago Annual physical exam  ? [facility] Glean Hess, MD  ?  ?  ?Future Appointments   ?        ? In 2 weeks Army Melia Jesse Sans, MD [facility], [place]  ?  ? ?  ?  ?  ? ?

## 2021-11-20 ENCOUNTER — Ambulatory Visit (INDEPENDENT_AMBULATORY_CARE_PROVIDER_SITE_OTHER): Payer: Medicare Other | Admitting: Internal Medicine

## 2021-11-20 ENCOUNTER — Encounter: Payer: Self-pay | Admitting: Internal Medicine

## 2021-11-20 VITALS — BP 130/78 | HR 100 | Ht 61.0 in | Wt 155.0 lb

## 2021-11-20 DIAGNOSIS — E782 Mixed hyperlipidemia: Secondary | ICD-10-CM

## 2021-11-20 DIAGNOSIS — I10 Essential (primary) hypertension: Secondary | ICD-10-CM

## 2021-11-20 DIAGNOSIS — R7303 Prediabetes: Secondary | ICD-10-CM | POA: Diagnosis not present

## 2021-11-20 NOTE — Progress Notes (Signed)
? ? ?Date:  11/20/2021  ? ?Name:  Bethany Martinez   DOB:  20-Mar-1946   MRN:  [id number] ? ? ?Chief Complaint: Hypertension (Not taking amlodipine- 3 months, made her light headed ) and Prediabetes ? ?Hypertension ?This is a chronic problem. The problem is controlled (at home 120-130/70-80). Pertinent negatives include no chest pain, headaches, palpitations or shortness of breath. Past treatments include angiotensin blockers and diuretics (stopped amlodipine due to lightheadedness). The current treatment provides significant improvement. There are no compliance problems.  There is no history of kidney disease, CAD/MI or CVA.  ?Hyperlipidemia ?This is a chronic problem. The problem is uncontrolled (was not taking meds last visit). Pertinent negatives include no chest pain or shortness of breath. Current antihyperlipidemic treatment includes statins.  ?Diabetes ?She presents for her follow-up diabetic visit. Diabetes type: prediabetes. Her disease course has been improving. Pertinent negatives for hypoglycemia include no dizziness or headaches. Pertinent negatives for diabetes include no chest pain, no fatigue and no weakness. Pertinent negatives for diabetic complications include no CVA. Current diabetic treatment includes diet.  ? ?Lab Results  ?Component Value Date  ? NA 142 05/22/2021  ? K 3.6 05/22/2021  ? CO2 27 05/22/2021  ? GLUCOSE 103 (H) 05/22/2021  ? BUN 13 05/22/2021  ? CREATININE 0.97 05/22/2021  ? CALCIUM 9.7 05/22/2021  ? EGFR 61 05/22/2021  ? GFRNONAA 67 05/18/2020  ? ?Lab Results  ?Component Value Date  ? CHOL 253 (H) 05/22/2021  ? HDL 52 05/22/2021  ? LDLCALC 175 (H) 05/22/2021  ? TRIG 141 05/22/2021  ? CHOLHDL 4.9 (H) 05/22/2021  ? ?Lab Results  ?Component Value Date  ? TSH 2.200 05/22/2021  ? ?Lab Results  ?Component Value Date  ? HGBA1C 6.2 (H) 05/22/2021  ? ?Lab Results  ?Component Value Date  ? WBC 5.4 05/22/2021  ? HGB 12.2 05/22/2021  ? HCT 36.6 05/22/2021  ? MCV 84 05/22/2021  ? PLT 308  05/22/2021  ? ?Lab Results  ?Component Value Date  ? ALT 19 05/22/2021  ? AST 20 05/22/2021  ? ALKPHOS 84 05/22/2021  ? BILITOT 0.6 05/22/2021  ? ?Lab Results  ?Component Value Date  ? VD25OH 23.0 (L) 05/18/2020  ?  ? ?Review of Systems  ?Constitutional:  Negative for fatigue and unexpected weight change.  ?HENT:  Negative for nosebleeds.   ?Eyes:  Negative for visual disturbance.  ?Respiratory:  Negative for cough, chest tightness, shortness of breath and wheezing.   ?Cardiovascular:  Negative for chest pain, palpitations and leg swelling.  ?Gastrointestinal:  Negative for abdominal pain, constipation and diarrhea.  ?Neurological:  Negative for dizziness, weakness, light-headedness and headaches.  ? ?Patient Active Problem List  ? Diagnosis Date Noted  ? Colon cancer screening   ? Polyp of descending colon   ? Age-related osteoporosis without fracture 05/18/2020  ? Mixed hyperlipidemia 05/18/2019  ? Eczema 07/18/2017  ? Prediabetes 06/07/2015  ? Edema extremities 01/11/2015  ? Allergic state 01/11/2015  ? Essential (primary) hypertension 01/11/2015  ? ? ?Allergies  ?Allergen Reactions  ? Atorvastatin Other (See Comments)  ?  myalgia  ? ? ?Past Surgical History:  ?Procedure Laterality Date  ? ABDOMINAL HYSTERECTOMY  1988  ? partial  ? COLONOSCOPY  01/2009  ? Dr. Jamal Collin  ? COLONOSCOPY WITH PROPOFOL N/A 07/12/2021  ? Procedure: COLONOSCOPY WITH BIOPSY;  Surgeon: Lin Landsman, MD;  Location: [place];  Service: Endoscopy;  Laterality: N/A;  ? POLYPECTOMY N/A 07/12/2021  ? Procedure: POLYPECTOMY;  Surgeon: Marius Ditch,  Tally Due, MD;  Location: [place];  Service: Endoscopy;  Laterality: N/A;  ? ? ?Social History  ? ?Tobacco Use  ? Smoking status: Never  ? Smokeless tobacco: Never  ? Tobacco comments:  ?  smoking cessation materials not required  ?Vaping Use  ? Vaping Use: Never used  ?Substance Use Topics  ? Alcohol use: No  ?  Alcohol/week: 0.0 standard drinks  ? Drug use: No   ? ? ? ?Medication list has been reviewed and updated. ? ?Current Meds  ?Medication Sig  ? aspirin EC 81 MG tablet Take 81 mg by mouth daily. Patient taking 3 times per week  ? hydrochlorothiazide (HYDRODIURIL) 25 MG tablet TAKE 1 TABLET (25 MG TOTAL) BY MOUTH DAILY.  ? latanoprost (XALATAN) 0.005 % ophthalmic solution 1 drop at bedtime.  ? losartan (COZAAR) 100 MG tablet TAKE 1 TABLET BY MOUTH EVERY DAY  ? Multiple Vitamin (MULTI-VITAMIN PO) Take by mouth daily.  ? rosuvastatin (CRESTOR) 5 MG tablet TAKE 1 TABLET BY MOUTH THREE TIMES A WEEK  ? triamcinolone ointment (KENALOG) 0.1 % Apply 1 application topically 2 (two) times daily. To knees and elbows  ? TURMERIC CURCUMIN PO Take by mouth.  ? UNABLE TO FIND Med Name: Bone Strength Calcium w/ mag, C,D3 K2 and zinc  ? [DISCONTINUED] Calcium-Phosphorus-Vitamin D (CALCIUM/D3 ADULT GUMMIES PO) Take by mouth.  ? ? ? ?  11/20/2021  ?  9:40 AM 05/22/2021  ?  9:21 AM 04/06/2021  ? 10:50 AM 11/21/2020  ?  8:56 AM  ?GAD 7 : Generalized Anxiety Score  ?Nervous, Anxious, on Edge 0 0 0 0  ?Control/stop worrying 0 0 0 0  ?Worry too much - different things 0 0 0 0  ?Trouble relaxing 0 0 0 0  ?Restless 0 0 0 0  ?Easily annoyed or irritable 0 0 0 0  ?Afraid - awful might happen 0 0 0 0  ?Total GAD 7 Score 0 0 0 0  ?Anxiety Difficulty  Not difficult at all Not difficult at all   ? ? ? ?  11/20/2021  ?  9:40 AM  ?Depression screen PHQ 2/9  ?Decreased Interest 0  ?Down, Depressed, Hopeless 0  ?PHQ - 2 Score 0  ?Altered sleeping 0  ?Tired, decreased energy 1  ?Change in appetite 0  ?Feeling bad or failure about yourself  0  ?Trouble concentrating 0  ?Moving slowly or fidgety/restless 0  ?Suicidal thoughts 0  ?PHQ-9 Score 1  ?Difficult doing work/chores Not difficult at all  ? ? ?BP Readings from Last 3 Encounters:  ?11/20/21 130/78  ?07/12/21 104/66  ?06/20/21 138/84  ? ? ?Physical Exam ?Vitals and nursing note reviewed.  ?Constitutional:   ?   General: She is not in acute distress. ?    Appearance: Normal appearance. She is well-developed.  ?HENT:  ?   Head: Normocephalic and atraumatic.  ?Cardiovascular:  ?   Rate and Rhythm: Normal rate and regular rhythm.  ?   Pulses: Normal pulses.  ?   Heart sounds: No murmur heard. ?Pulmonary:  ?   Effort: Pulmonary effort is normal. No respiratory distress.  ?   Breath sounds: No wheezing or rhonchi.  ?Musculoskeletal:  ?   Cervical back: Normal range of motion.  ?   Right lower leg: No edema.  ?   Left lower leg: No edema.  ?Lymphadenopathy:  ?   Cervical: No cervical adenopathy.  ?Skin: ?   General: Skin is warm and dry.  ?  Findings: No rash.  ?Neurological:  ?   Mental Status: She is alert and oriented to person, place, and time.  ?Psychiatric:     ?   Mood and Affect: Mood normal.     ?   Behavior: Behavior normal.  ? ? ?Wt Readings from Last 3 Encounters:  ?11/20/21 155 lb (70.3 kg)  ?07/12/21 150 lb 4.8 oz (68.2 kg)  ?06/20/21 153 lb (69.4 kg)  ? ? ?BP 130/78   Pulse 100   Ht $R'5\' 1"'PZ$  (1.549 m)   Wt 155 lb (70.3 kg)   SpO2 99%   BMI 29.29 kg/m?  ? ?Assessment and Plan: ?1. Essential (primary) hypertension ?Clinically stable exam with well controlled BP despite stopping amlodipine. ?Tolerating current medications without side effects at this time. ?Pt to continue current regimen and low sodium diet; benefits of regular exercise as able discussed. ?- Comprehensive metabolic panel ? ?2. Mixed hyperlipidemia ?Now back on statin three times a week. ?Check labs and advise. ?- Lipid panel ? ?3. Prediabetes ?Discussed lower carb diet choices. ?Continue regular physical activity ?Try to avoid further weight gain. ?- Hemoglobin A1c ? ? ?Partially dictated using Editor, commissioning. Any errors are unintentional. ? ?Halina Maidens, MD ?[facility] ?[facility] ? ?11/20/2021 ? ? ? ? ? ?

## 2021-11-21 LAB — COMPREHENSIVE METABOLIC PANEL
ALT: 27 IU/L (ref 0–32)
AST: 19 IU/L (ref 0–40)
Albumin/Globulin Ratio: 1.4 (ref 1.2–2.2)
Albumin: 4.3 g/dL (ref 3.7–4.7)
Alkaline Phosphatase: 104 IU/L (ref 44–121)
BUN/Creatinine Ratio: 16 (ref 12–28)
BUN: 14 mg/dL (ref 8–27)
Bilirubin Total: 0.7 mg/dL (ref 0.0–1.2)
CO2: 25 mmol/L (ref 20–29)
Calcium: 9.9 mg/dL (ref 8.7–10.3)
Chloride: 98 mmol/L (ref 96–106)
Creatinine, Ser: 0.9 mg/dL (ref 0.57–1.00)
Globulin, Total: 3.1 g/dL (ref 1.5–4.5)
Glucose: 113 mg/dL — ABNORMAL HIGH (ref 70–99)
Potassium: 3.5 mmol/L (ref 3.5–5.2)
Sodium: 140 mmol/L (ref 134–144)
Total Protein: 7.4 g/dL (ref 6.0–8.5)
eGFR: 67 mL/min/{1.73_m2} (ref >59)

## 2021-11-21 LAB — LIPID PANEL
Chol/HDL Ratio: 4.7 ratio — ABNORMAL HIGH (ref 0.0–4.4)
Cholesterol, Total: 249 mg/dL — ABNORMAL HIGH (ref 100–199)
HDL: 53 mg/dL (ref >39)
LDL Chol Calc (NIH): 148 mg/dL — ABNORMAL HIGH (ref 0–99)
Triglycerides: 265 mg/dL — ABNORMAL HIGH (ref 0–149)
VLDL Cholesterol Cal: 48 mg/dL — ABNORMAL HIGH (ref 5–40)

## 2021-11-21 LAB — HEMOGLOBIN A1C
Est. average glucose Bld gHb Est-mCnc: 128 mg/dL
Hgb A1c MFr Bld: 6.1 % — ABNORMAL HIGH (ref 4.8–5.6)

## 2021-12-17 ENCOUNTER — Other Ambulatory Visit: Payer: Self-pay | Admitting: Internal Medicine

## 2021-12-17 DIAGNOSIS — E782 Mixed hyperlipidemia: Secondary | ICD-10-CM

## 2021-12-18 NOTE — Telephone Encounter (Signed)
Rx 10/23/21 #[id number]- too soon Requested Prescriptions  Pending Prescriptions Disp Refills  . rosuvastatin (CRESTOR) 5 MG tablet [Pharmacy Med Name: ROSUVASTATIN CALCIUM 5 MG TAB] 12 tablet 2    Sig: TAKE 1 TABLET BY MOUTH THREE TIMES A WEEK.     Cardiovascular:  Antilipid - Statins 2 Failed - 12/17/2021  8:30 AM      Failed - Lipid Panel in normal range within the last 12 months    Cholesterol, Total  Date Value Ref Range Status  11/20/2021 249 (H) 100 - 199 mg/dL Final   LDL Chol Calc (NIH)  Date Value Ref Range Status  11/20/2021 148 (H) 0 - 99 mg/dL Final   HDL  Date Value Ref Range Status  11/20/2021 53 >39 mg/dL Final   Triglycerides  Date Value Ref Range Status  11/20/2021 265 (H) 0 - 149 mg/dL Final         Passed - Cr in normal range and within 360 days    Creatinine, Ser  Date Value Ref Range Status  11/20/2021 0.90 0.57 - 1.00 mg/dL Final         Passed - Patient is not pregnant      Passed - Valid encounter within last 12 months    Recent Outpatient Visits          4 weeks ago Essential (primary) hypertension   [facility] Glean Hess, MD   4 months ago COVID-19 virus infection   [facility] Glean Hess, [facility]   7 months ago Annual physical exam   [facility] Glean Hess, MD   8 months ago Essential (primary) hypertension   [facility] Glean Hess, MD   1 year ago Essential (primary) hypertension   [place], Laura H, MD      Future Appointments            In 5 months Army Melia Jesse Sans, MD [facility]

## 2022-01-24 ENCOUNTER — Other Ambulatory Visit: Payer: Self-pay | Admitting: Internal Medicine

## 2022-01-24 DIAGNOSIS — I10 Essential (primary) hypertension: Secondary | ICD-10-CM

## 2022-01-24 NOTE — Telephone Encounter (Signed)
Refused amlodipine 5 mg tablets because it was discontinued 11/20/2021 and it's not on her med. List.

## 2022-02-05 ENCOUNTER — Other Ambulatory Visit: Payer: Self-pay | Admitting: Internal Medicine

## 2022-02-05 DIAGNOSIS — E782 Mixed hyperlipidemia: Secondary | ICD-10-CM

## 2022-02-06 NOTE — Telephone Encounter (Signed)
Requested Prescriptions  Pending Prescriptions Disp Refills  . rosuvastatin (CRESTOR) 5 MG tablet [Pharmacy Med Name: ROSUVASTATIN CALCIUM 5 MG TAB] 36 tablet 1    Sig: TAKE 1 TABLET BY MOUTH THREE TIMES A WEEK.     Cardiovascular:  Antilipid - Statins 2 Failed - 02/05/2022  2:31 PM      Failed - Lipid Panel in normal range within the last 12 months    Cholesterol, Total  Date Value Ref Range Status  11/20/2021 249 (H) 100 - 199 mg/dL Final   LDL Chol Calc (NIH)  Date Value Ref Range Status  11/20/2021 148 (H) 0 - 99 mg/dL Final   HDL  Date Value Ref Range Status  11/20/2021 53 >39 mg/dL Final   Triglycerides  Date Value Ref Range Status  11/20/2021 265 (H) 0 - 149 mg/dL Final         Passed - Cr in normal range and within 360 days    Creatinine, Ser  Date Value Ref Range Status  11/20/2021 0.90 0.57 - 1.00 mg/dL Final         Passed - Patient is not pregnant      Passed - Valid encounter within last 12 months    Recent Outpatient Visits          2 months ago Essential (primary) hypertension   [facility] Glean Hess, MD   6 months ago COVID-19 virus infection   [facility] Glean Hess, [facility]   8 months ago Annual physical exam   [facility] Glean Hess, MD   10 months ago Essential (primary) hypertension   [facility] Glean Hess, MD   1 year ago Essential (primary) hypertension   [facility] Glean Hess, MD      Future Appointments            In 3 months Army Melia Jesse Sans, MD [facility], [facility]

## 2022-02-08 ENCOUNTER — Other Ambulatory Visit: Payer: Self-pay | Admitting: Internal Medicine

## 2022-02-08 DIAGNOSIS — I10 Essential (primary) hypertension: Secondary | ICD-10-CM

## 2022-02-08 NOTE — Telephone Encounter (Signed)
Requested Prescriptions  Pending Prescriptions Disp Refills  . hydrochlorothiazide (HYDRODIURIL) 25 MG tablet [Pharmacy Med Name: HYDROCHLOROTHIAZIDE 25 MG TAB] 90 tablet 0    Sig: TAKE 1 TABLET (25 MG TOTAL) BY MOUTH DAILY.     Cardiovascular: Diuretics - Thiazide Passed - 02/08/2022  2:30 AM      Passed - Cr in normal range and within 180 days    Creatinine, Ser  Date Value Ref Range Status  11/20/2021 0.90 0.57 - 1.00 mg/dL Final         Passed - K in normal range and within 180 days    Potassium  Date Value Ref Range Status  11/20/2021 3.5 3.5 - 5.2 mmol/L Final         Passed - Na in normal range and within 180 days    Sodium  Date Value Ref Range Status  11/20/2021 140 134 - 144 mmol/L Final         Passed - Last BP in normal range    BP Readings from Last 1 Encounters:  11/20/21 130/78         Passed - Valid encounter within last 6 months    Recent Outpatient Visits          2 months ago Essential (primary) hypertension   [facility] Glean Hess, MD   6 months ago COVID-19 virus infection   [facility] Glean Hess, MD   8 months ago Annual physical exam   [facility] Glean Hess, MD   10 months ago Essential (primary) hypertension   [facility] Glean Hess, MD   1 year ago Essential (primary) hypertension   [facility] Glean Hess, MD      Future Appointments            In 3 months Army Melia Jesse Sans, MD [facility], [facility]

## 2022-02-14 IMAGING — MG DIGITAL SCREENING BILAT W/ TOMO W/ CAD
8 series · 8 of 24 positions shown · non-contrast
Comparison: Previous exam(s).

CLINICAL DATA: Screening.

EXAM:
DIGITAL SCREENING BILATERAL MAMMOGRAM WITH TOMO AND CAD

[L CC synth-2D]
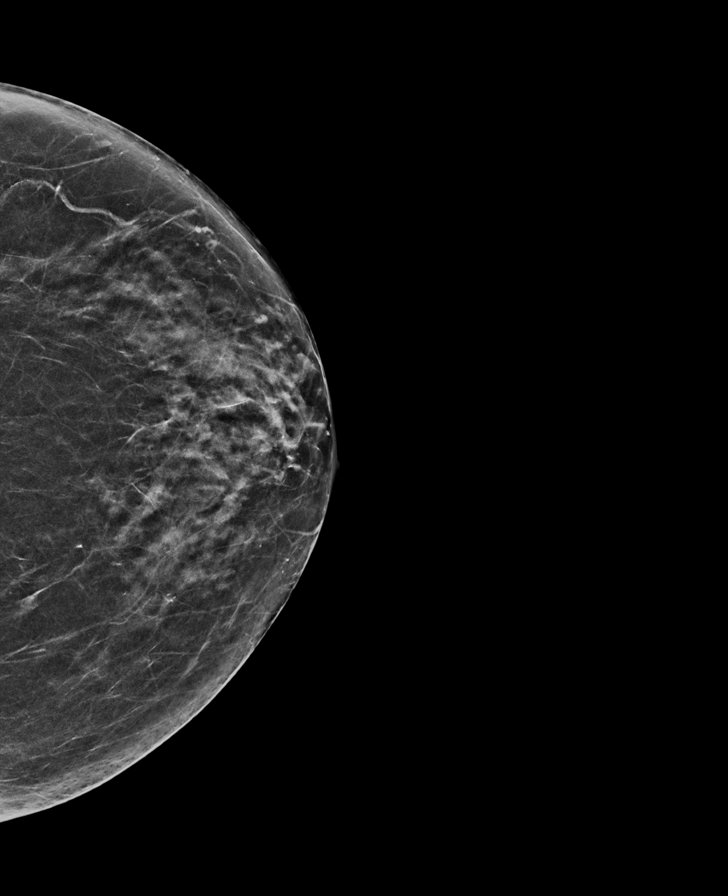

[R CC synth-2D]
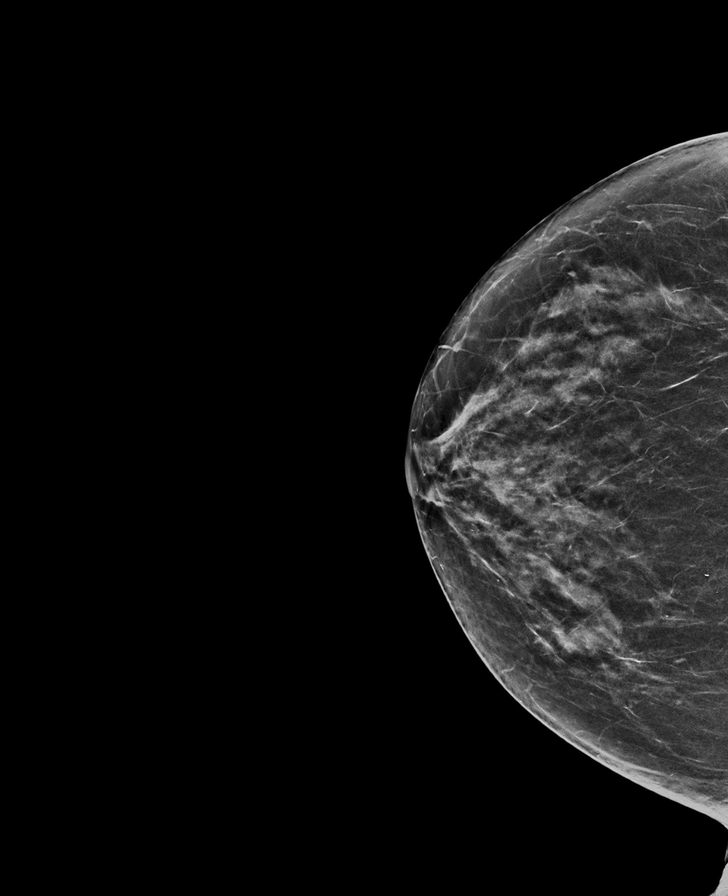

[L MLO synth-2D]
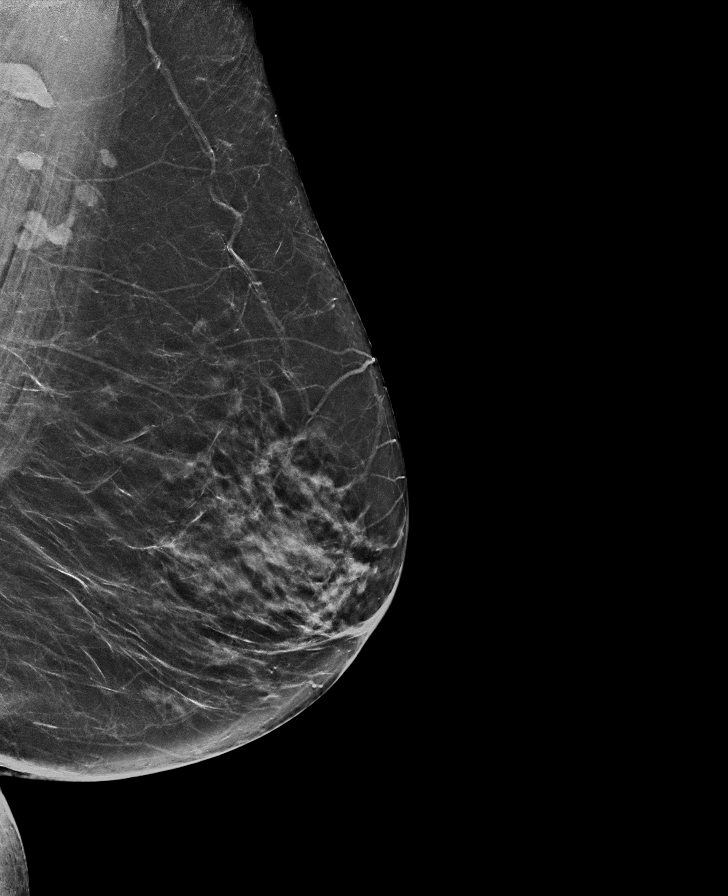

[R MLO synth-2D]
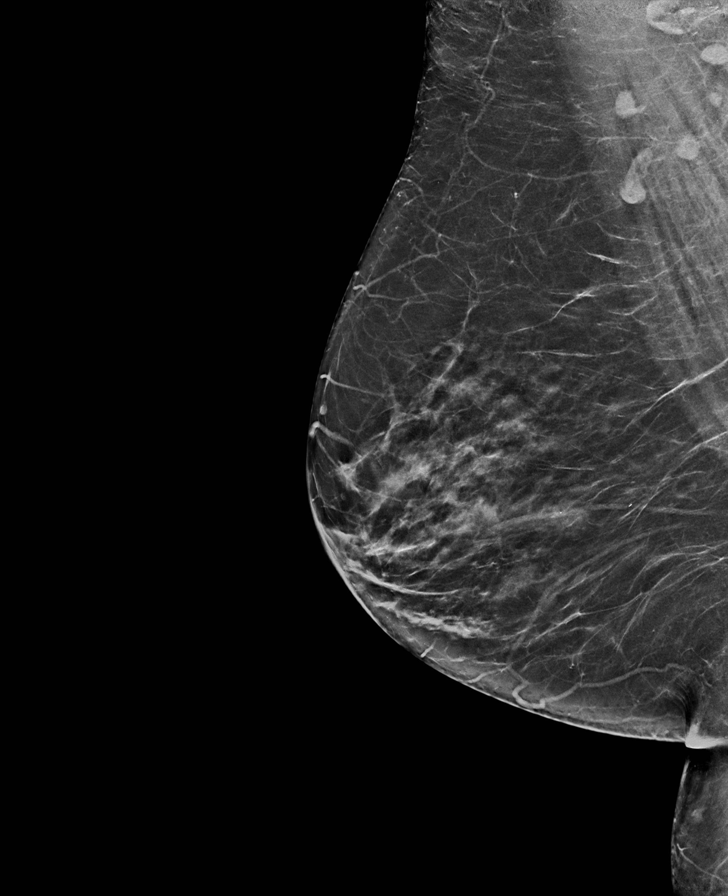

[R CC tomo · tomo slice 31/62.0]
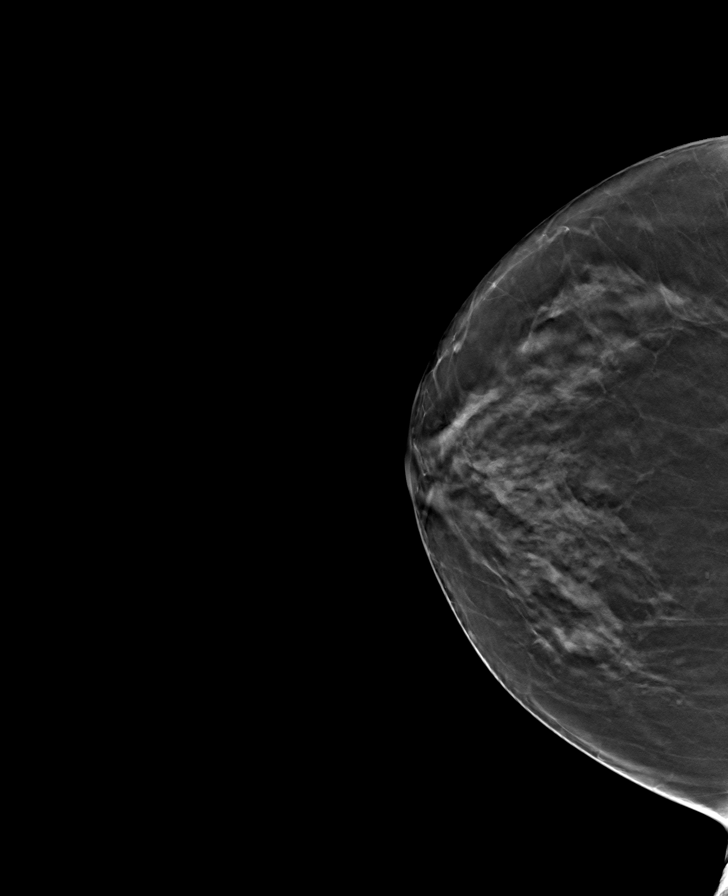

[L MLO tomo · tomo slice 33/66.0]
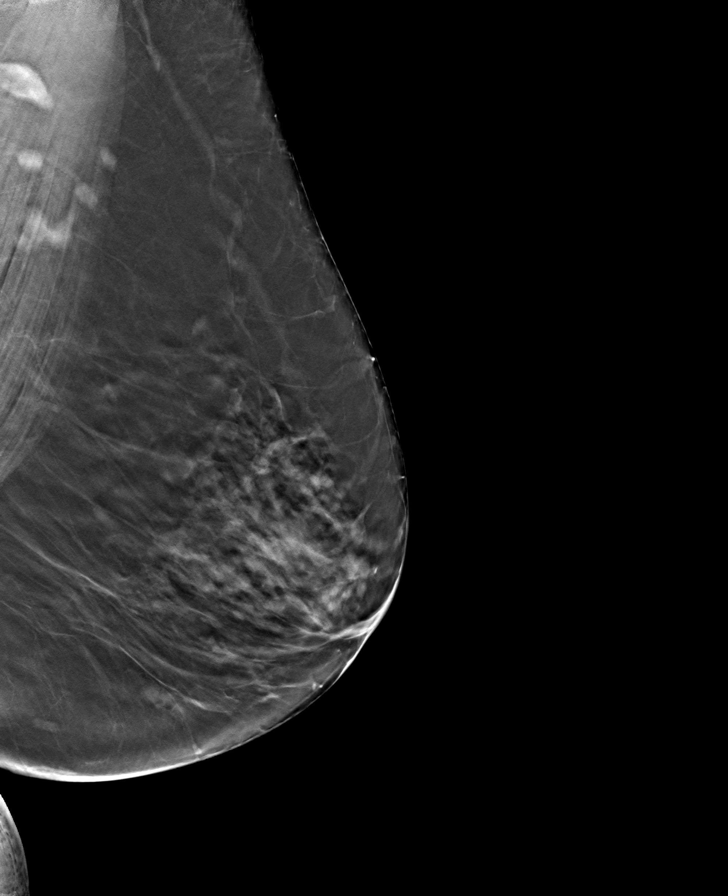

[L CC tomo · tomo slice 29/57.0]
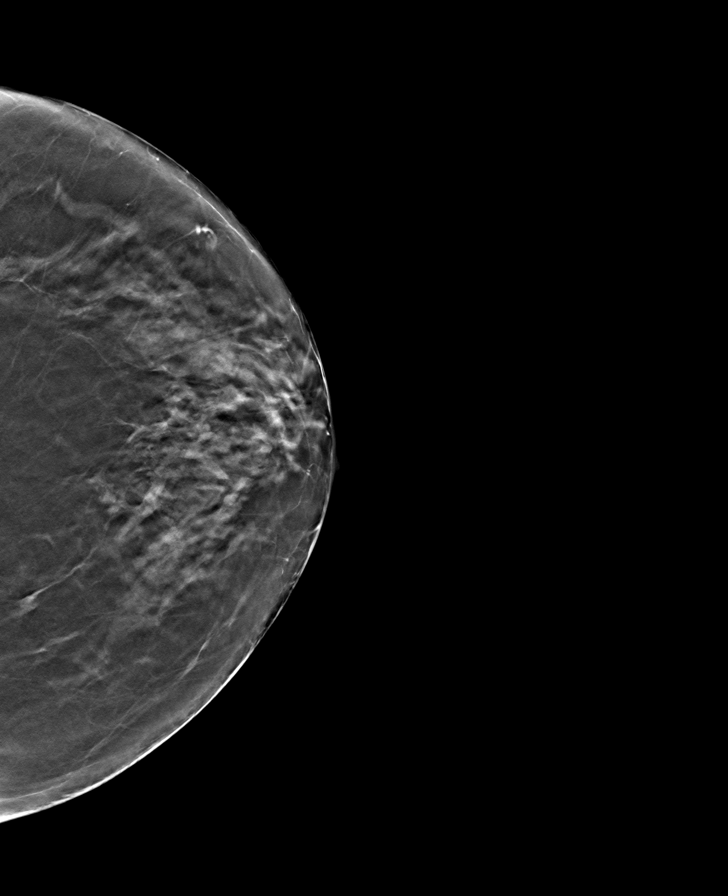

[R MLO tomo · tomo slice 34/67.0]
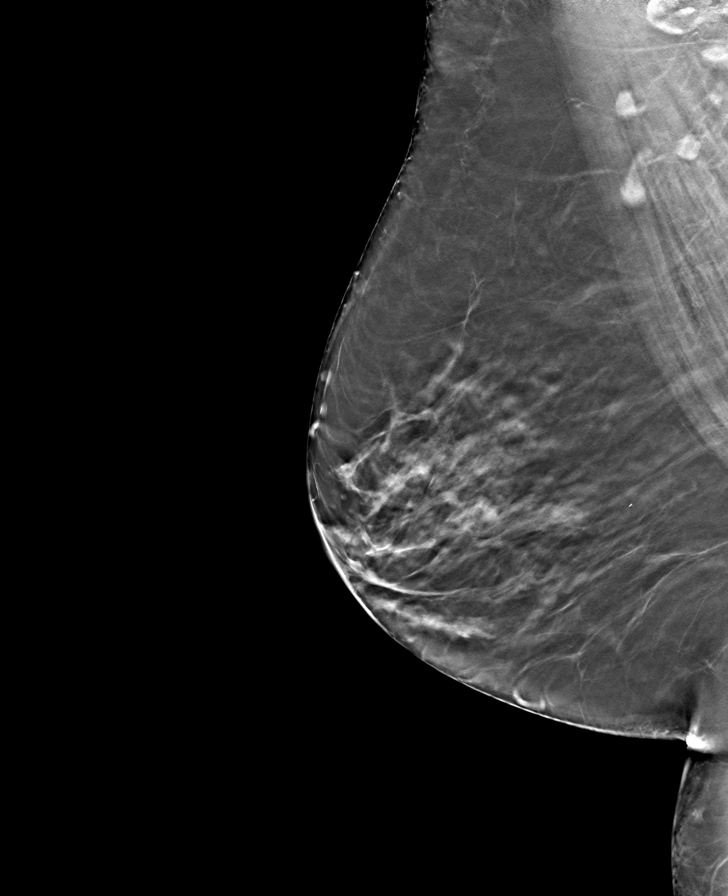

[8 of 24 positions shown; findings below may reference images not displayed]

ACR Breast Density Category c: The breast tissue is heterogeneously
dense, which may obscure small masses.
FINDINGS: There are no findings suspicious for malignancy. Images were
processed with CAD.
IMPRESSION: No mammographic evidence of malignancy. A result letter of this
screening mammogram will be mailed directly to the patient.

RECOMMENDATION:
Screening mammogram in one year. (Code:[id number])

BI-RADS CATEGORY  1: Negative.

## 2022-03-05 ENCOUNTER — Ambulatory Visit
Admission: EM | Admit: 2022-03-05 | Discharge: 2022-03-05 | Disposition: A | Discharge status: Home / Self Care | Payer: Medicare Other | Attending: Internal Medicine | Admitting: Internal Medicine

## 2022-03-05 ENCOUNTER — Ambulatory Visit: Payer: Self-pay | Admitting: *Deleted

## 2022-03-05 DIAGNOSIS — H538 Other visual disturbances: Secondary | ICD-10-CM | POA: Diagnosis not present

## 2022-03-05 DIAGNOSIS — R42 Dizziness and giddiness: Secondary | ICD-10-CM

## 2022-03-05 DIAGNOSIS — H2513 Age-related nuclear cataract, bilateral: Secondary | ICD-10-CM | POA: Diagnosis not present

## 2022-03-05 DIAGNOSIS — H401131 Primary open-angle glaucoma, bilateral, mild stage: Secondary | ICD-10-CM | POA: Diagnosis not present

## 2022-03-05 DIAGNOSIS — M3501 Sicca syndrome with keratoconjunctivitis: Secondary | ICD-10-CM | POA: Diagnosis not present

## 2022-03-05 NOTE — Telephone Encounter (Signed)
Pt has been feeling  a little lightheadedness but bp is 120/80.  She would like to see Dr. Army Melia asap.Marland Kitchen ask for today but there is nothing today not until Sept.   CB@  [phone]    Chief Complaint: lightheadedness Symptoms: lightheadedness at times. BP 151/83 P 93. C/o blurred vision  Frequency: started 'Sunday 03/03/22 Pertinent Negatives: Patient denies chest pain no difficulty breathing no weakness on either side of body. No N/T face or extremities. No c/o balance issues.  Disposition: [x]ED /[]Urgent Care (no appt availability in office) / []Appointment(In office/virtual)/ [] [facility]/ []Home Care/ []Refused Recommended Disposition /[][facility]/ [x] Follow-up with PCP Additional Notes:   No available appt . Recommended if sx occur again go to ED. Please advise if appt can be scheduled.       Reason for Disposition  [1] MODERATE dizziness (e.g., interferes with normal activities) AND [2] has been evaluated by doctor (or NP/PA) for this  Answer Assessment - Initial Assessment Questions 1. DESCRIPTION: "Describe your dizziness."     Feeling lightheaded 2. LIGHTHEADED: "Do you feel lightheaded?" (e.g., somewhat faint, woozy, weak upon standing)     lightheaded 3. VERTIGO: "Do you feel like either you or the room is spinning or tilting?" (i.e. vertigo)     no 4. SEVERITY: "How bad is it?"  "Do you feel like you are going to faint?" "Can you stand and walk?"   - MILD: Feels slightly dizzy, but walking normally.   - MODERATE: Feels unsteady when walking, but not falling; interferes with normal activities (e.g., school, work).   - SEVERE: Unable to walk without falling, or requires assistance to walk without falling; feels like passing out now.      Can walk not dizzy when standing or changing position in head  5. ONSET:  "When did the dizziness begin?"     Sunday 03/03/22 6. AGGRAVATING FACTORS: "Does anything make it worse?" (e.g., standing, change in  head position)     Nothing  7. HEART RATE: "Can you tell me your heart rate?" "How many beats in 15 seconds?"  (Note: not all patients can do this)       93'$  BP 151/83 8. CAUSE: "What do you think is causing the dizziness?"     Not sure  9. RECURRENT SYMPTOM: "Have you had dizziness before?" If Yes, ask: "When was the last time?" "What happened that time?"     Yes  10. OTHER SYMPTOMS: "Do you have any other symptoms?" (e.g., fever, chest pain, vomiting, diarrhea, bleeding)       Blurred vision  11. PREGNANCY: "Is there any chance you are pregnant?" "When was your last menstrual period?"       na  Protocols used: Dizziness - Lightheadedness-A-AH

## 2022-03-05 NOTE — ED Triage Notes (Signed)
Patient to Urgent care with reports of x3 days of lightheadedness and blurred vision, states it is worse in her right eye. Reports symptoms came on gradually. States she has been checking her BP at home and occassionally her systolic is as low as 80. Denies worsening symptoms when changing positions. Reports her ears sometimes feel full, denies pain. Hx of sinus issues.   Reports symptoms worsened today. Hx of hypertension. Compliant with prescribed medications.

## 2022-03-05 NOTE — Discharge Instructions (Signed)
No danger signs on exam or in symptom history.   Did not appreciate any evidence of low blood volume or dehydration on exam.  ECG was normal and unchanged from previous.  Neuro exam is intact, with gait, balance, speech and mentation preserved. Change in vision for the right eye in the last 2 days with elevation of blood pressure is potentially worrisome for a tiny stroke in the back of the right eye; Dr Lazarus Salines at [facility] in [place] will take a look today to assess this possibility.  Please go there now.

## 2022-03-05 NOTE — ED Provider Notes (Signed)
[facility]    CSN: [id number] Arrival date & time: 03/05/22  1205      History   Chief Complaint Chief Complaint  Patient presents with   Dizziness   Blurred Vision    HPI Bethany Martinez is a 76 y.o. female. She presents for evaluation of dizziness, blurry vision, that started a couple days ago.  The dizziness occurred for a few minutes yesterday and the day before.  She describes this as being light headed, and was a little shaky with one of the episodes; the shakiness has resolved.  She has not fallen, and was able to walk into the urgent care independently.  Has been taking it easy.    Also feels like her vision in the right eye is more blurred than usual in the last couple days.  This is not episodic, but has been constant since it started.     BP up a little at the urgent care.  No weakness or clumsiness of the arms or legs, speech is ok, family do not think she has been out of it or confused.  Not vomiting, no change in bowel habits, no headache, not short of breath.  Was able to ride her bike for 30 minutes days, and sometimes takes 2 mile walks.    Hx "severe sinus issues," flonase makes her light headed although she uses it if she has to.  Saw ENT a while back and did not achieve a permanent resolution to her symptoms.   Dizziness   Past Medical History:  Diagnosis Date   Allergy    Diabetes mellitus without complication ([place])    Encounter for dental exam and cleaning w/o abnormal findings 05/18/2018   Glaucoma    Hyperlipidemia    Hypertension     Patient Active Problem List   Diagnosis Date Noted   Colon cancer screening    Polyp of descending colon    Age-related osteoporosis without fracture 05/18/2020   Mixed hyperlipidemia 05/18/2019   Eczema 07/18/2017   Prediabetes 06/07/2015   Edema extremities 01/11/2015   Allergic state 01/11/2015   Essential (primary) hypertension 01/11/2015    Past Surgical History:  Procedure  Laterality Date   ABDOMINAL HYSTERECTOMY  1988   partial   COLONOSCOPY  01/2009   Dr. Jamal Collin   COLONOSCOPY WITH PROPOFOL N/A 07/12/2021   Procedure: COLONOSCOPY WITH BIOPSY;  Surgeon: Lin Landsman, MD;  Location: [facility];  Service: Endoscopy;  Laterality: N/A;   POLYPECTOMY N/A 07/12/2021   Procedure: POLYPECTOMY;  Surgeon: Lin Landsman, MD;  Location: [place];  Service: Endoscopy;  Laterality: N/A;    Home Medications    Prior to Admission medications   Medication Sig Start Date End Date Taking? Authorizing Provider  aspirin EC 81 MG tablet Take 81 mg by mouth daily. Patient taking 3 times per week    [provider]  hydrochlorothiazide (HYDRODIURIL) 25 MG tablet TAKE 1 TABLET (25 MG TOTAL) BY MOUTH DAILY. 02/08/22   Glean Hess, MD  losartan (COZAAR) 100 MG tablet TAKE 1 TABLET BY MOUTH EVERY DAY 01/24/22   Glean Hess, MD  rosuvastatin (CRESTOR) 5 MG tablet TAKE 1 TABLET BY MOUTH THREE TIMES A WEEK. 02/06/22   Glean Hess, MD  triamcinolone ointment (KENALOG) 0.1 % Apply 1 application topically 2 (two) times daily. To knees and elbows 04/06/21   Glean Hess, MD  TURMERIC CURCUMIN PO Take by mouth.    [provider]  UNABLE TO FIND Med Name: Bone Strength Calcium w/ mag, C,D3 K2 and zinc    [provider]    Family History Family History  Problem Relation Age of Onset   Hypertension Mother    Hypertension Father    Breast cancer Sister 86   Cancer Sister        colon   Dementia Sister    Breast cancer Paternal Aunt 59   Heart disease Brother        CABG    Social History Social History   Tobacco Use   Smoking status: Never   Smokeless tobacco: Never   Tobacco comments:    smoking cessation materials not required  Vaping Use   Vaping Use: Never used  Substance Use Topics   Alcohol use: No    Alcohol/week: 0.0 standard drinks of alcohol   Drug use: No     Allergies    Atorvastatin   Review of Systems Review of Systems  Neurological:  Positive for dizziness.  See also HPI   Physical Exam Triage Vital Signs ED Triage Vitals  Enc Vitals Group     BP 03/05/22 1219 (!) 162/85     Pulse Rate 03/05/22 1219 86     Resp 03/05/22 1219 19     Temp 03/05/22 1219 98.2 F (36.8 C)     Temp Source 03/05/22 1219 Oral     SpO2 03/05/22 1219 99 %     Weight 03/05/22 1223 152 lb (68.9 kg)     Height 03/05/22 1223 '5\' 1"'$  (1.549 m)     Pain Score 03/05/22 1223 0     Pain Loc --    No data found.   Updated Vital Signs BP (!) 162/85   Pulse 86   Temp 98.2 F (36.8 C) (Oral)   Resp 19   Ht '5\' 1"'$  (1.549 m)   Wt 68.9 kg   SpO2 99%   BMI 28.72 kg/m   Visual Acuity Right Eye Distance: 20/40 corrected Left Eye Distance: 20/40 corrected Bilateral Distance: 20/30 corrected  Physical Exam Constitutional:      General: She is not in acute distress.    Appearance: She is not ill-appearing.     Comments: Good hygiene  HENT:     Head: Atraumatic.     Comments: B TMs quite dull, no erythema Mod severe nasal congestion bilaterally Post pharynx unremarkable, tongue midline    Mouth/Throat:     Mouth: Mucous membranes are moist.  Eyes:     Conjunctiva/sclera:     Right eye: Right conjunctiva is not injected. No exudate.    Left eye: Left conjunctiva is not injected. No exudate.    Comments: Conjugate gaze observed  Cardiovascular:     Rate and Rhythm: Normal rate and regular rhythm.  Pulmonary:     Effort: Pulmonary effort is normal. No respiratory distress.     Breath sounds: No wheezing or rhonchi.  Abdominal:     General: There is no distension.  Musculoskeletal:     Cervical back: Neck supple.     Comments: Walked into the urgent care indpendently No leg swelling  Skin:    General: Skin is warm and dry.     Comments: No cyanosis  Neurological:     Mental Status: She is alert.     Comments: Face symmetric, speech clear/coherent/logical       UC Treatments / Results  Labs (all labs ordered are listed, but only abnormal results are displayed)  Labs Reviewed - No data to display NA  EKG NA  Radiology No results found. NA  Procedures Procedures (including critical care time) NA  Medications Ordered in UC Medications - No data to display NA   Final Clinical Impressions(s) / UC Diagnoses   Final diagnoses:  Dizziness  Blurry vision, right eye     Discharge Instructions      No danger signs on exam or in symptom history.   Did not appreciate any evidence of low blood volume or dehydration on exam.  ECG was normal and unchanged from previous.  Neuro exam is intact, with gait, balance, speech and mentation preserved. Change in vision for the right eye in the last 2 days with elevation of blood pressure is potentially worrisome for a tiny stroke in the back of the right eye; Dr Lazarus Salines at [facility] in [place] will take a look today to assess this possibility.  Please go there now.     ED Prescriptions   None    PDMP not reviewed this encounter.   Wynona Luna, MD 03/09/22 [phone]

## 2022-03-26 ENCOUNTER — Ambulatory Visit (INDEPENDENT_AMBULATORY_CARE_PROVIDER_SITE_OTHER): Payer: Medicare Other

## 2022-03-26 ENCOUNTER — Other Ambulatory Visit: Payer: Self-pay

## 2022-03-26 ENCOUNTER — Ambulatory Visit
Admission: RE | Admit: 2022-03-26 | Discharge: 2022-03-26 | Disposition: A | Discharge status: Home / Self Care | Payer: Medicare Other | Source: Ambulatory Visit | Attending: Family Medicine | Admitting: Family Medicine

## 2022-03-26 VITALS — BP 159/84 | HR 86 | Temp 98.4°F | Resp 18

## 2022-03-26 DIAGNOSIS — H539 Unspecified visual disturbance: Secondary | ICD-10-CM | POA: Diagnosis not present

## 2022-03-26 DIAGNOSIS — R42 Dizziness and giddiness: Secondary | ICD-10-CM | POA: Diagnosis not present

## 2022-03-26 LAB — COMPREHENSIVE METABOLIC PANEL
ALT: 34 U/L (ref 0–44)
AST: 27 U/L (ref 15–41)
Albumin: 4.2 g/dL (ref 3.5–5.0)
Alkaline Phosphatase: 104 U/L (ref 38–126)
Anion gap: 7 (ref 5–15)
BUN: 17 mg/dL (ref 8–23)
CO2: 29 mmol/L (ref 22–32)
Calcium: 9.6 mg/dL (ref 8.9–10.3)
Chloride: 101 mmol/L (ref 98–111)
Creatinine, Ser: 0.88 mg/dL (ref 0.44–1.00)
GFR, Estimated: >60 mL/min (ref >60)
Glucose, Bld: 95 mg/dL (ref 70–99)
Potassium: 3.7 mmol/L (ref 3.5–5.1)
Sodium: 137 mmol/L (ref 135–145)
Total Bilirubin: 0.8 mg/dL (ref 0.3–1.2)
Total Protein: 8.3 g/dL — ABNORMAL HIGH (ref 6.5–8.1)

## 2022-03-26 LAB — CBC WITH DIFFERENTIAL/PLATELET
Abs Immature Granulocytes: 0.03 10*3/uL (ref 0.00–0.07)
Basophils Absolute: 0 10*3/uL (ref 0.0–0.1)
Basophils Relative: 0 %
Eosinophils Absolute: 0.1 10*3/uL (ref 0.0–0.5)
Eosinophils Relative: 1 %
HCT: 39.2 % (ref 36.0–46.0)
Hemoglobin: 12.7 g/dL (ref 12.0–15.0)
Immature Granulocytes: 0 %
Lymphocytes Relative: 25 %
Lymphs Abs: 1.7 10*3/uL (ref 0.7–4.0)
MCH: 27.9 pg (ref 26.0–34.0)
MCHC: 32.4 g/dL (ref 30.0–36.0)
MCV: 86 fL (ref 80.0–100.0)
Monocytes Absolute: 0.6 10*3/uL (ref 0.1–1.0)
Monocytes Relative: 9 %
Neutro Abs: 4.6 10*3/uL (ref 1.7–7.7)
Neutrophils Relative %: 65 %
Platelets: 293 10*3/uL (ref 150–400)
RBC: 4.56 MIL/uL (ref 3.87–5.11)
RDW: 14.8 % (ref 11.5–15.5)
WBC: 7 10*3/uL (ref 4.0–10.5)
nRBC: 0 % (ref 0.0–0.2)

## 2022-03-26 NOTE — ED Notes (Signed)
CT does not require prior auth

## 2022-03-26 NOTE — Discharge Instructions (Addendum)
I am unsure why you are having dizziness.  It may be related to the fluid that is in your ear.  Use the Flonase as directed in the office today.  Your head imaging was normal.  Follow-up with your eye doctor and your primary care provider.  You may need to see an ear nose and throat doctor.

## 2022-03-26 NOTE — ED Triage Notes (Addendum)
Pt continues to have blurred vision and feels dizzy. Pt was seen on 03-05-22 . Pt went to follow up APPt. With ophthalmologist and is being treated for dry eye. PT reports she feels like a Zombie.

## 2022-03-26 NOTE — ED Provider Notes (Signed)
[facility]    CSN: [id number] Arrival date & time: 03/26/22  1148      History   Chief Complaint Chief Complaint  Patient presents with   Dizziness    Dizziness, light-hdad - Entered by patient   Blurred Vision    HPI Bethany Martinez is a 76 y.o. female.   HPI  Bethany Martinez presents for persistent dizziness and blurry vision.  She feels "so-so" and "like a zombie".  She was seen in urgent care 2 weeks ago for similar symptoms.  Sent to [facility].  [facility] dilated her eye and said she had "severe dry eye".  She was told to get omega 3 drops for her right eye.  This has helped somewhat as her vision is not as blurry as it was before.  Denies confusion, focal weakness, numbness, tingling headache.  She tries to remain active.  Her dizziness is described as lightheadedness.  Denies room spinning.  Has to hold on to something when she moves. Has bilateral ear pressure.  Has history of "severe sinus issues"  for which she is using steroid nasal spray.   Coughs up a little phlegm in the morning. Her voice has been raspy than normal.  She took a COVID test 2 weeks ago was normal around when her sx started.    Fever : no  Chills: no Sore throat: no   Cough: no Nasal congestion : yes Appetite: normal  Hydration: normal  Abdominal pain: no Nausea: no Vomiting: no Diarrhea: no Dysuria: no  Sleep disturbance: no Back Pain: no   Past Medical History:  Diagnosis Date   Allergy    Diabetes mellitus without complication ([place])    Encounter for dental exam and cleaning w/o abnormal findings 05/18/2018   Glaucoma    Hyperlipidemia    Hypertension     Patient Active Problem List   Diagnosis Date Noted   Colon cancer screening    Polyp of descending colon    Age-related osteoporosis without fracture 05/18/2020   Mixed hyperlipidemia 05/18/2019   Eczema 07/18/2017   Prediabetes 06/07/2015   Edema extremities 01/11/2015   Allergic state  01/11/2015   Essential (primary) hypertension 01/11/2015    Past Surgical History:  Procedure Laterality Date   ABDOMINAL HYSTERECTOMY  1988   partial   COLONOSCOPY  01/2009   Dr. Jamal Collin   COLONOSCOPY WITH PROPOFOL N/A 07/12/2021   Procedure: COLONOSCOPY WITH BIOPSY;  Surgeon: Lin Landsman, MD;  Location: [facility];  Service: Endoscopy;  Laterality: N/A;   POLYPECTOMY N/A 07/12/2021   Procedure: POLYPECTOMY;  Surgeon: Lin Landsman, MD;  Location: [place];  Service: Endoscopy;  Laterality: N/A;    OB History   No obstetric history on file.      Home Medications    Prior to Admission medications   Medication Sig Start Date End Date Taking? Authorizing Provider  aspirin EC 81 MG tablet Take 81 mg by mouth daily. Patient taking 3 times per week    [provider]  hydrochlorothiazide (HYDRODIURIL) 25 MG tablet TAKE 1 TABLET (25 MG TOTAL) BY MOUTH DAILY. 02/08/22   Glean Hess, MD  losartan (COZAAR) 100 MG tablet TAKE 1 TABLET BY MOUTH EVERY DAY 01/24/22   Glean Hess, MD  rosuvastatin (CRESTOR) 5 MG tablet TAKE 1 TABLET BY MOUTH THREE TIMES A WEEK. 02/06/22   Glean Hess, MD  triamcinolone ointment (KENALOG) 0.1 % Apply 1 application topically 2 (two)  times daily. To knees and elbows 04/06/21   Glean Hess, MD  TURMERIC CURCUMIN PO Take by mouth.    [provider]  UNABLE TO FIND Med Name: Bone Strength Calcium w/ mag, C,D3 K2 and zinc    [provider]    Family History Family History  Problem Relation Age of Onset   Hypertension Mother    Hypertension Father    Breast cancer Sister 55   Cancer Sister        colon   Dementia Sister    Breast cancer Paternal Aunt 85   Heart disease Brother        CABG    Social History Social History   Tobacco Use   Smoking status: Never   Smokeless tobacco: Never   Tobacco comments:    smoking cessation materials not required  Vaping Use   Vaping  Use: Never used  Substance Use Topics   Alcohol use: No    Alcohol/week: 0.0 standard drinks of alcohol   Drug use: No     Allergies   Atorvastatin   Review of Systems Review of Systems : :negative unless otherwise stated in HPI.      Physical Exam Triage Vital Signs ED Triage Vitals [03/26/22 1212]  Enc Vitals Group     BP (!) 159/84     Pulse Rate 86     Resp 18     Temp 98.4 F (36.9 C)     Temp src      SpO2 100 %     Weight      Height      Head Circumference      Peak Flow      Pain Score 0     Pain Loc      Pain Edu?      Excl. in [place]?    No data found.  Updated Vital Signs BP (!) 159/84   Pulse 86   Temp 98.4 F (36.9 C)   Resp 18   SpO2 100%   Visual Acuity Right Eye Distance:   Left Eye Distance:   Bilateral Distance:    Right Eye Near:   Left Eye Near:    Bilateral Near:     Physical Exam  GEN: alert, well appearing elderly female, in no acute distress    HENT:  mucus membranes moist, oropharyngeal without lesions or erythema,  nares patent, no nasal discharge , right TM effusion without erythema or bulging, left TM appears normal EYES:   pupils equal and reactive, EOM intact NECK:  supple, normal ROM, no lymphadenopathy, no meningismus RESP:  clear to auscultation bilaterally, no increased work of breathing  CVS:   regular rate and rhythm, +murmur, distal pulses intact   ABD:  soft, non-tender EXT:   normal ROM, normal gait, negative Romberg, negative finger-nose NEURO:  speech normal, alert and oriented, cranial nerves II through XII grossly intact, strength 5/5 bilateral upper and lower extremities, gross sensation intact Skin:   warm and dry Psych: Normal affect, appropriate speech and behavior    UC Treatments / Results  Labs (all labs ordered are listed, but only abnormal results are displayed) Labs Reviewed  COMPREHENSIVE METABOLIC PANEL - Abnormal; Notable for the following components:      Result Value   Total Protein 8.3  (*)    All other components within normal limits  CBC WITH DIFFERENTIAL/PLATELET    EKG   Radiology CT Head Wo Contrast  Result Date: 03/26/2022  CLINICAL DATA:  Blurry vision and dizziness. EXAM: CT HEAD WITHOUT CONTRAST TECHNIQUE: Contiguous axial images were obtained from the base of the skull through the vertex without intravenous contrast. RADIATION DOSE REDUCTION: This exam was performed according to the departmental dose-optimization program which includes automated exposure control, adjustment of the mA and/or kV according to patient size and/or use of iterative reconstruction technique. COMPARISON:  None Available. FINDINGS: Brain: There is no acute intracranial hemorrhage, extra-axial fluid collection, or acute infarct. Background parenchymal volume is within expected limits for age. The ventricles are normal in size. Gray-white differentiation is preserved. There is patchy hypodensity in the supratentorial white matter which is nonspecific but likely reflects sequela of underlying chronic small vessel ischemic change. There is no mass lesion.  There is no mass effect or midline shift. Vascular: There is calcification of the bilateral cavernous ICAs. Skull: Normal. Negative for fracture or focal lesion. Sinuses/Orbits: The imaged paranasal sinuses are clear. The imaged globes and orbits are unremarkable. Other: None. IMPRESSION: Unremarkable for age head CT with no acute intracranial pathology. Electronically Signed   By: Valetta Mole M.D.   On: 03/26/2022 13:32    Procedures Procedures (including critical care time)  Medications Ordered in UC Medications - No data to display  Initial Impression / Assessment and Plan / UC Course  I have reviewed the triage vital signs and the nursing notes.  Pertinent labs & imaging results that were available during my care of the patient were reviewed by me and considered in my medical decision making (see chart for details).     Patient is a  76 year old female with history of hypertension, prediabetes, hyperlipidemia who presents for ongoing intermittent dizziness and blurry vision.  Etiology and prognosis unclear.  Differential diagnosis includes intracranial mass or hemorrhage,    she has seen an ophthalmologist at [facility] who told her she had severe dry eye.  Unable to see these records.  Exam not concerning for vertigo and neuro exam is unremarkable.  I do not see any head imaging.  CT head obtained was negative for acute pathology.  On exam, he does have a right ear effusion which may be causing her symptoms.  Discussed using steroid ointment perpendicular to her nasal septum for eustachian tube dysfunction.  Has not seen ENT in 1 to 2 years.  Advised follow-up with ENT if her dizziness does not improve.  Discussed MDM, treatment plan and plan for follow-up with patient/parent who agrees with plan.     Final Clinical Impressions(s) / UC Diagnoses   Final diagnoses:  Dizziness     Discharge Instructions      I am unsure why you are having dizziness.  It may be related to the fluid that is in your ear.  Use the Flonase as directed in the office today.  Your head imaging was normal.  Follow-up with your eye doctor and your primary care provider.  You may need to see an ear nose and throat doctor.     ED Prescriptions   None    PDMP not reviewed this encounter.   Lyndee Hensen, DO 03/26/22 1549

## 2022-04-24 ENCOUNTER — Other Ambulatory Visit: Payer: Self-pay | Admitting: Internal Medicine

## 2022-04-24 DIAGNOSIS — I10 Essential (primary) hypertension: Secondary | ICD-10-CM

## 2022-04-24 NOTE — Telephone Encounter (Signed)
Requested Prescriptions  Pending Prescriptions Disp Refills  . losartan (COZAAR) 100 MG tablet [Pharmacy Med Name: LOSARTAN POTASSIUM 100 MG TAB] 90 tablet 0    Sig: TAKE 1 TABLET BY MOUTH EVERY DAY     Cardiovascular:  Angiotensin Receptor Blockers Failed - 04/24/2022  2:47 AM      Failed - Last BP in normal range    BP Readings from Last 1 Encounters:  03/26/22 (!) 159/84         Passed - Cr in normal range and within 180 days    Creatinine, Ser  Date Value Ref Range Status  03/26/2022 0.88 0.44 - 1.00 mg/dL Final         Passed - K in normal range and within 180 days    Potassium  Date Value Ref Range Status  03/26/2022 3.7 3.5 - 5.1 mmol/L Final         Passed - Patient is not pregnant      Passed - Valid encounter within last 6 months    Recent Outpatient Visits          5 months ago Essential (primary) hypertension   [facility], Jesse Sans, MD   8 months ago COVID-19 virus infection   [place] [facility] at [facility], Jesse Sans, MD   11 months ago Annual physical exam   [place] [facility] at [facility], Jesse Sans, MD   1 year ago Essential (primary) hypertension   [facility] [facility] at [facility], Jesse Sans, MD   1 year ago Essential (primary) hypertension   [place] [facility] at [facility], Jesse Sans, MD      Future Appointments            In 4 weeks Army Melia Jesse Sans, MD [facility]

## 2022-05-09 ENCOUNTER — Other Ambulatory Visit: Payer: Self-pay | Admitting: Internal Medicine

## 2022-05-09 DIAGNOSIS — I10 Essential (primary) hypertension: Secondary | ICD-10-CM

## 2022-05-09 NOTE — Telephone Encounter (Signed)
Requested Prescriptions  Pending Prescriptions Disp Refills  . hydrochlorothiazide (HYDRODIURIL) 25 MG tablet [Pharmacy Med Name: HYDROCHLOROTHIAZIDE 25 MG TAB] 90 tablet 0    Sig: TAKE 1 TABLET (25 MG TOTAL) BY MOUTH DAILY.     Cardiovascular: Diuretics - Thiazide Failed - 05/09/2022  3:31 AM      Failed - Last BP in normal range    BP Readings from Last 1 Encounters:  03/26/22 (!) 159/84         Passed - Cr in normal range and within 180 days    Creatinine, Ser  Date Value Ref Range Status  03/26/2022 0.88 0.44 - 1.00 mg/dL Final         Passed - K in normal range and within 180 days    Potassium  Date Value Ref Range Status  03/26/2022 3.7 3.5 - 5.1 mmol/L Final         Passed - Na in normal range and within 180 days    Sodium  Date Value Ref Range Status  03/26/2022 137 135 - 145 mmol/L Final  11/20/2021 140 134 - 144 mmol/L Final         Passed - Valid encounter within last 6 months    Recent Outpatient Visits          5 months ago Essential (primary) hypertension   [facility] at [facility], Jesse Sans, MD   9 months ago COVID-19 virus infection   [place] [facility] at [facility], Jesse Sans, MD   11 months ago Annual physical exam   [place] at [facility], Jesse Sans, MD   1 year ago Essential (primary) hypertension   [place] [facility] at [facility], Jesse Sans, MD   1 year ago Essential (primary) hypertension   [place] [facility] at [facility], Jesse Sans, MD      Future Appointments            In 2 weeks [facility] Melia, Jesse Sans, MD [facility] at [facility], [facility]

## 2022-05-23 ENCOUNTER — Ambulatory Visit (INDEPENDENT_AMBULATORY_CARE_PROVIDER_SITE_OTHER): Payer: Medicare Other | Admitting: Internal Medicine

## 2022-05-23 ENCOUNTER — Encounter: Payer: Self-pay | Admitting: Internal Medicine

## 2022-05-23 VITALS — BP 126/76 | HR 86 | Ht 61.0 in | Wt 153.8 lb

## 2022-05-23 DIAGNOSIS — Z1231 Encounter for screening mammogram for malignant neoplasm of breast: Secondary | ICD-10-CM

## 2022-05-23 DIAGNOSIS — M818 Other osteoporosis without current pathological fracture: Secondary | ICD-10-CM

## 2022-05-23 DIAGNOSIS — Z1211 Encounter for screening for malignant neoplasm of colon: Secondary | ICD-10-CM | POA: Diagnosis not present

## 2022-05-23 DIAGNOSIS — Z23 Encounter for immunization: Secondary | ICD-10-CM

## 2022-05-23 DIAGNOSIS — Z0000 Encounter for general adult medical examination without abnormal findings: Secondary | ICD-10-CM

## 2022-05-23 DIAGNOSIS — I10 Essential (primary) hypertension: Secondary | ICD-10-CM | POA: Diagnosis not present

## 2022-05-23 DIAGNOSIS — R7303 Prediabetes: Secondary | ICD-10-CM

## 2022-05-23 DIAGNOSIS — E782 Mixed hyperlipidemia: Secondary | ICD-10-CM

## 2022-05-23 LAB — POCT URINALYSIS DIPSTICK
Bilirubin, UA: NEGATIVE
Blood, UA: NEGATIVE
Glucose, UA: NEGATIVE
Ketones, UA: NEGATIVE
Leukocytes, UA: NEGATIVE
Nitrite, UA: NEGATIVE
Protein, UA: NEGATIVE
Spec Grav, UA: 1.01 (ref 1.010–1.025)
Urobilinogen, UA: 0.2 E.U./dL
pH, UA: 7 (ref 5.0–8.0)

## 2022-05-23 NOTE — Progress Notes (Signed)
Date:  05/23/2022   Name:  Bethany Martinez   DOB:  Apr 07, 1946   MRN:  [id number]   Chief Complaint: Annual Exam Bethany Martinez is a 76 y.o. female who presents today for her Complete Annual Exam. She feels well. She reports exercising - walking. She reports she is sleeping well. Breast complaints - none.  Mammogram: 05/2021 DEXA: 05/2020 osteoporosis Colonoscopy:  06/2021 repeat 7 yrs  Health Maintenance Due  Topic Date Due   INFLUENZA VACCINE  02/26/2022   Medicare Annual Wellness (AWV)  07/20/2022    Immunization History  Administered Date(s) Administered   Fluad Quad(high Dose 65+) 05/10/2019, 05/10/2020, 04/06/2021   Influenza, High Dose Seasonal PF 05/23/2017, 04/17/2018   Influenza-Unspecified 05/11/2015, 05/23/2017, 06/22/2019   Moderna Sars-Covid-2 Vaccination 09/20/2019, 10/18/2019, 06/27/2020   Pneumococcal Conjugate-13 06/07/2015   Pneumococcal Polysaccharide-23 12/01/2012   Tdap 11/27/2009, 04/30/2022   Zoster Recombinat (Shingrix) 06/22/2019, 01/27/2020   Zoster, Live 01/11/2011    Hypertension This is a chronic problem. The problem is controlled. Pertinent negatives include no chest pain, headaches, palpitations or shortness of breath. Past treatments include angiotensin blockers and diuretics. There is no history of kidney disease, CAD/MI or CVA.  Hyperlipidemia This is a chronic problem. The problem is uncontrolled. Recent lipid tests were reviewed and are high. Pertinent negatives include no chest pain or shortness of breath. Current antihyperlipidemic treatment includes statins.  Diabetes She presents for her follow-up diabetic visit. Diabetes type: prediabetes. Her disease course has been stable. Pertinent negatives for hypoglycemia include no dizziness, headaches, nervousness/anxiousness or tremors. Pertinent negatives for diabetes include no chest pain, no fatigue, no polydipsia and no polyuria. Pertinent negatives for diabetic complications  include no CVA. Current diabetic treatment includes diet.  Osteoporosis -  no fractures or fall.  She is on daily Calcium and vitamin D.  Lab Results  Component Value Date   NA 137 03/26/2022   K 3.7 03/26/2022   CO2 29 03/26/2022   GLUCOSE 95 03/26/2022   BUN 17 03/26/2022   CREATININE 0.88 03/26/2022   CALCIUM 9.6 03/26/2022   EGFR 67 11/20/2021   GFRNONAA >60 03/26/2022   Lab Results  Component Value Date   CHOL 249 (H) 11/20/2021   HDL 53 11/20/2021   LDLCALC 148 (H) 11/20/2021   TRIG 265 (H) 11/20/2021   CHOLHDL 4.7 (H) 11/20/2021   Lab Results  Component Value Date   TSH 2.200 05/22/2021   Lab Results  Component Value Date   HGBA1C 6.1 (H) 11/20/2021   Lab Results  Component Value Date   WBC 7.0 03/26/2022   HGB 12.7 03/26/2022   HCT 39.2 03/26/2022   MCV 86.0 03/26/2022   PLT 293 03/26/2022   Lab Results  Component Value Date   ALT 34 03/26/2022   AST 27 03/26/2022   ALKPHOS 104 03/26/2022   BILITOT 0.8 03/26/2022   Lab Results  Component Value Date   VD25OH 23.0 (L) 05/18/2020     Review of Systems  Constitutional:  Negative for chills, fatigue and fever.  HENT:  Negative for congestion, hearing loss, tinnitus, trouble swallowing and voice change.   Eyes:  Negative for visual disturbance.  Respiratory:  Negative for cough, chest tightness, shortness of breath and wheezing.   Cardiovascular:  Negative for chest pain, palpitations and leg swelling.  Gastrointestinal:  Negative for abdominal pain, constipation, diarrhea and vomiting.  Endocrine: Negative for polydipsia and polyuria.  Genitourinary:  Negative for dysuria, frequency, genital sores, vaginal bleeding and vaginal  discharge.  Musculoskeletal:  Negative for arthralgias, gait problem and joint swelling.  Skin:  Negative for color change and rash.  Neurological:  Negative for dizziness, tremors, light-headedness and headaches.  Hematological:  Negative for adenopathy. Does not bruise/bleed  easily.  Psychiatric/Behavioral:  Negative for dysphoric mood and sleep disturbance. The patient is not nervous/anxious.     Patient Active Problem List   Diagnosis Date Noted   Colon cancer screening    Polyp of descending colon    Age-related osteoporosis without fracture 05/18/2020   Mixed hyperlipidemia 05/18/2019   Eczema 07/18/2017   Prediabetes 06/07/2015   Edema extremities 01/11/2015   Allergic state 01/11/2015   Essential (primary) hypertension 01/11/2015    Allergies  Allergen Reactions   Atorvastatin Other (See Comments)    myalgia    Past Surgical History:  Procedure Laterality Date   ABDOMINAL HYSTERECTOMY  1988   partial   COLONOSCOPY  01/2009   Dr. Jamal Collin   COLONOSCOPY WITH PROPOFOL N/A 07/12/2021   Procedure: COLONOSCOPY WITH BIOPSY;  Surgeon: Lin Landsman, MD;  Location: [place];  Service: Endoscopy;  Laterality: N/A;   POLYPECTOMY N/A 07/12/2021   Procedure: POLYPECTOMY;  Surgeon: Lin Landsman, MD;  Location: [place];  Service: Endoscopy;  Laterality: N/A;    Social History   Tobacco Use   Smoking status: Never   Smokeless tobacco: Never   Tobacco comments:    smoking cessation materials not required  Vaping Use   Vaping Use: Never used  Substance Use Topics   Alcohol use: No    Alcohol/week: 0.0 standard drinks of alcohol   Drug use: No     Medication list has been reviewed and updated.  Current Meds  Medication Sig   aspirin EC 81 MG tablet Take 81 mg by mouth daily. Patient taking 3 times per week   hydrochlorothiazide (HYDRODIURIL) 25 MG tablet TAKE 1 TABLET (25 MG TOTAL) BY MOUTH DAILY.   losartan (COZAAR) 100 MG tablet TAKE 1 TABLET BY MOUTH EVERY DAY   rosuvastatin (CRESTOR) 5 MG tablet TAKE 1 TABLET BY MOUTH THREE TIMES A WEEK.   triamcinolone ointment (KENALOG) 0.1 % Apply 1 application topically 2 (two) times daily. To knees and elbows   TURMERIC CURCUMIN PO Take by mouth.   UNABLE TO FIND  Med Name: Bone Strength Calcium w/ mag, C,D3 K2 and zinc       05/23/2022    9:45 AM 11/20/2021    9:40 AM 05/22/2021    9:21 AM 04/06/2021   10:50 AM  GAD 7 : Generalized Anxiety Score  Nervous, Anxious, on Edge 0 0 0 0  Control/stop worrying 0 0 0 0  Worry too much - different things 0 0 0 0  Trouble relaxing 0 0 0 0  Restless 0 0 0 0  Easily annoyed or irritable 0 0 0 0  Afraid - awful might happen 0 0 0 0  Total GAD 7 Score 0 0 0 0  Anxiety Difficulty Not difficult at all  Not difficult at all Not difficult at all       05/23/2022    9:45 AM 11/20/2021    9:40 AM 06/20/2021   11:23 AM  Depression screen PHQ 2/9  Decreased Interest 0 0 0  Down, Depressed, Hopeless 0 0 0  PHQ - 2 Score 0 0 0  Altered sleeping 0 0 0  Tired, decreased energy 0 1 0  Change in appetite 0 0 0  Feeling  bad or failure about yourself  0 0 0  Trouble concentrating 0 0 0  Moving slowly or fidgety/restless 0 0 0  Suicidal thoughts 0 0 0  PHQ-9 Score 0 1 0  Difficult doing work/chores Not difficult at all Not difficult at all Not difficult at all    BP Readings from Last 3 Encounters:  05/23/22 126/76  03/26/22 (!) 159/84  03/05/22 (!) 162/85    Physical Exam Vitals and nursing note reviewed.  Constitutional:      General: She is not in acute distress.    Appearance: She is well-developed.  HENT:     Head: Normocephalic and atraumatic.     Right Ear: Tympanic membrane and ear canal normal.     Left Ear: Tympanic membrane and ear canal normal.     Nose:     Right Sinus: No maxillary sinus tenderness.     Left Sinus: No maxillary sinus tenderness.  Eyes:     General: No scleral icterus.       Right eye: No discharge.        Left eye: No discharge.     Conjunctiva/sclera: Conjunctivae normal.  Neck:     Thyroid: No thyromegaly.     Vascular: No carotid bruit.  Cardiovascular:     Rate and Rhythm: Normal rate and regular rhythm.     Pulses: Normal pulses.     Heart sounds: Normal  heart sounds.  Pulmonary:     Effort: Pulmonary effort is normal. No respiratory distress.     Breath sounds: No wheezing.  Chest:  Breasts:    Right: No mass, nipple discharge, skin change or tenderness.     Left: No mass, nipple discharge, skin change or tenderness.  Abdominal:     General: Bowel sounds are normal.     Palpations: Abdomen is soft.     Tenderness: There is no abdominal tenderness.  Musculoskeletal:     Cervical back: Normal range of motion. No erythema.     Right lower leg: No edema.     Left lower leg: No edema.  Lymphadenopathy:     Cervical: No cervical adenopathy.  Skin:    General: Skin is warm and dry.     Findings: No rash.  Neurological:     Mental Status: She is alert and oriented to person, place, and time.     Cranial Nerves: No cranial nerve deficit.     Sensory: No sensory deficit.     Deep Tendon Reflexes: Reflexes are normal and symmetric.  Psychiatric:        Attention and Perception: Attention normal.        Mood and Affect: Mood normal.     Wt Readings from Last 3 Encounters:  05/23/22 153 lb 12.8 oz (69.8 kg)  03/05/22 152 lb (68.9 kg)  11/20/21 155 lb (70.3 kg)    BP 126/76 (BP Location: Right Arm, Cuff Size: Normal)   Pulse 86   Ht _0  (1.549 m)   Wt 153 lb 12.8 oz (69.8 kg)   BMI 29.06 kg/m   Assessment and Plan: 1. Annual physical exam Normal exam. Up to date on screenings and immunizations. She was recently dx'd with dry eyes and is using a lubricating drop.  2. Encounter for screening mammogram for breast cancer Schedule at [facility]  3. Colon cancer screening Recent Colonoscopy  4. Essential (primary) hypertension Clinically stable exam with well controlled BP. Tolerating medications without side  effects at this time. She does appear to have labile HTN Pt to continue current regimen and low sodium diet; benefits of regular exercise as able discussed. - CBC with  Differential/Platelet - Comprehensive metabolic panel - TSH - POCT urinalysis dipstick  5. Age-related osteoporosis without fracture Recommend repeat DEXA next year Continue exercise, Calcium and vitamin D  6. Prediabetes Continue dietary changes - Comprehensive metabolic panel - Hemoglobin A1c  7. Mixed hyperlipidemia Tolerating TIW Crestor  Will continue and advise if change is needed - Comprehensive metabolic panel - Lipid panel   Partially dictated using Editor, commissioning. Any errors are unintentional.  Halina Maidens, MD [facility]  05/23/2022

## 2022-05-24 LAB — CBC WITH DIFFERENTIAL/PLATELET
Basophils Absolute: 0 10*3/uL (ref 0.0–0.2)
Basos: 0 %
EOS (ABSOLUTE): 0.1 10*3/uL (ref 0.0–0.4)
Eos: 1 %
Hematocrit: 38.8 % (ref 34.0–46.6)
Hemoglobin: 13.1 g/dL (ref 11.1–15.9)
Immature Grans (Abs): 0 10*3/uL (ref 0.0–0.1)
Immature Granulocytes: 0 %
Lymphocytes Absolute: 1.8 10*3/uL (ref 0.7–3.1)
Lymphs: 28 %
MCH: 28.6 pg (ref 26.6–33.0)
MCHC: 33.8 g/dL (ref 31.5–35.7)
MCV: 85 fL (ref 79–97)
Monocytes Absolute: 0.5 10*3/uL (ref 0.1–0.9)
Monocytes: 9 %
Neutrophils Absolute: 3.9 10*3/uL (ref 1.4–7.0)
Neutrophils: 62 %
Platelets: 329 10*3/uL (ref 150–450)
RBC: 4.58 x10E6/uL (ref 3.77–5.28)
RDW: 13.8 % (ref 11.7–15.4)
WBC: 6.3 10*3/uL (ref 3.4–10.8)

## 2022-05-24 LAB — LIPID PANEL
Chol/HDL Ratio: 3.7 ratio (ref 0.0–4.4)
Cholesterol, Total: 212 mg/dL — ABNORMAL HIGH (ref 100–199)
HDL: 57 mg/dL (ref >39)
LDL Chol Calc (NIH): 134 mg/dL — ABNORMAL HIGH (ref 0–99)
Triglycerides: 119 mg/dL (ref 0–149)
VLDL Cholesterol Cal: 21 mg/dL (ref 5–40)

## 2022-05-24 LAB — COMPREHENSIVE METABOLIC PANEL
ALT: 15 IU/L (ref 0–32)
AST: 14 IU/L (ref 0–40)
Albumin/Globulin Ratio: 1.6 (ref 1.2–2.2)
Albumin: 4.7 g/dL (ref 3.8–4.8)
Alkaline Phosphatase: 87 IU/L (ref 44–121)
BUN/Creatinine Ratio: 13 (ref 12–28)
BUN: 12 mg/dL (ref 8–27)
Bilirubin Total: 0.9 mg/dL (ref 0.0–1.2)
CO2: 24 mmol/L (ref 20–29)
Calcium: 9.9 mg/dL (ref 8.7–10.3)
Chloride: 97 mmol/L (ref 96–106)
Creatinine, Ser: 0.93 mg/dL (ref 0.57–1.00)
Globulin, Total: 2.9 g/dL (ref 1.5–4.5)
Glucose: 90 mg/dL (ref 70–99)
Potassium: 3.6 mmol/L (ref 3.5–5.2)
Sodium: 140 mmol/L (ref 134–144)
Total Protein: 7.6 g/dL (ref 6.0–8.5)
eGFR: 64 mL/min/{1.73_m2} (ref >59)

## 2022-05-24 LAB — HEMOGLOBIN A1C
Est. average glucose Bld gHb Est-mCnc: 128 mg/dL
Hgb A1c MFr Bld: 6.1 % — ABNORMAL HIGH (ref 4.8–5.6)

## 2022-05-24 LAB — TSH: TSH: 1.81 u[IU]/mL (ref 0.450–4.500)

## 2022-06-24 ENCOUNTER — Ambulatory Visit: Payer: Medicare Other

## 2022-06-28 ENCOUNTER — Ambulatory Visit: Payer: Medicare Other

## 2022-07-05 ENCOUNTER — Ambulatory Visit (INDEPENDENT_AMBULATORY_CARE_PROVIDER_SITE_OTHER): Payer: Medicare Other

## 2022-07-05 VITALS — BP 148/90 | HR 78 | Temp 98.0°F | Resp 17 | Ht 60.63 in | Wt 156.4 lb

## 2022-07-05 DIAGNOSIS — Z0000 Encounter for general adult medical examination without abnormal findings: Secondary | ICD-10-CM | POA: Diagnosis not present

## 2022-07-05 NOTE — Patient Instructions (Signed)

## 2022-07-05 NOTE — Progress Notes (Signed)
Subjective:   Bethany Martinez is a 76 y.o. female who presents for Medicare Annual (Subsequent) preventive examination.  Review of Systems    Per HPI unless specifically indicated below.  Cardiac Risk Factors include: advanced age (>53mn, >>90women);female gender, hypertension, and hyperlipidemia.          Objective:     Today's Vitals   07/05/22 1007 07/05/22 1013  BP: (!) 155/98 (!) 148/90  Pulse: 78   Resp: 17   Temp: 98 F (36.7 C)   TempSrc: Oral   Weight: 156 lb 6.4 oz (70.9 kg)   Height: 5' 0.63" (1.54 m)   PainSc: 0-No pain 0-No pain   Body mass index is 29.91 kg/m.     03/05/2022   12:25 PM 07/12/2021    7:16 AM 06/20/2021   11:23 AM 05/10/2020   10:58 AM 05/10/2019   10:52 AM 01/19/2018   10:06 AM 12/05/2015    9:41 AM  Advanced Directives  Does Patient Have a Medical Advance Directive? Yes Yes Yes Yes No Yes Yes  Type of AParamedicof [place]villeLiving will [place] HillsLiving will [place] HillsLiving will [place]Living will  [place]Living will   Does patient want to make changes to medical advance directive?  No - Patient declined       Copy of [place] Chart?  No - copy requested No - copy requested No - copy requested  No - copy requested No - copy requested  Would patient like information on creating a medical advance directive?     No - Patient declined      Current Medications (verified) Outpatient Encounter Medications as of 07/05/2022  Medication Sig   aspirin EC 81 MG tablet Take 81 mg by mouth daily. Patient taking 3 times per week   Calcium Carb-Cholecalciferol (LIQUID CALCIUM WITH D3) 600-12.5 MG-MCG CAPS Take by mouth.   hydrochlorothiazide (HYDRODIURIL) 25 MG tablet TAKE 1 TABLET (25 MG TOTAL) BY MOUTH DAILY.   latanoprost (XALATAN) 0.005 % ophthalmic solution SMARTSIG:In Eye(s)   losartan (COZAAR) 100 MG tablet  TAKE 1 TABLET BY MOUTH EVERY DAY   rosuvastatin (CRESTOR) 5 MG tablet TAKE 1 TABLET BY MOUTH THREE TIMES A WEEK.   triamcinolone ointment (KENALOG) 0.1 % Apply 1 application topically 2 (two) times daily. To knees and elbows   TURMERIC CURCUMIN PO Take by mouth.   UNABLE TO FIND Med Name: Bone Strength Calcium w/ mag, C,D3 K2 and zinc   No facility-administered encounter medications on file as of 07/05/2022.    Allergies (verified) Atorvastatin   History: Past Medical History:  Diagnosis Date   Allergy    Diabetes mellitus without complication (HWythe    Encounter for dental exam and cleaning w/o abnormal findings 05/18/2018   Glaucoma    Hyperlipidemia    Hypertension    Past Surgical History:  Procedure Laterality Date   ABDOMINAL HYSTERECTOMY  1988   partial   COLONOSCOPY  01/2009   Dr. SJamal Collin  COLONOSCOPY WITH PROPOFOL N/A 07/12/2021   Procedure: COLONOSCOPY WITH BIOPSY;  Surgeon: VLin Landsman MD;  [place]  Service: Endoscopy;  Laterality: N/A;   POLYPECTOMY N/A 07/12/2021   Procedure: POLYPECTOMY;  Surgeon: VLin Landsman MD;  [place]  Service: [place]oscopy;  Laterality: N/A;   Family History  Problem Relation Age of Onset   Hypertension Mother    Hypertension Father  Breast cancer Sister 67   Cancer Sister        colon   Dementia Sister    Breast cancer Paternal Aunt 60   Heart disease Brother        CABG   Social History   Socioeconomic History   Marital status: Divorced    Spouse name: Not on file   Number of children: 2   Years of education: some college   Highest education level: 12th grade  Occupational History   Occupation: Retired  Tobacco Use   Smoking status: Never   Smokeless tobacco: Never   Tobacco comments:    smoking cessation materials not required  Vaping Use   Vaping Use: Never used  Substance and Sexual Activity   Alcohol use: No    Alcohol/week: 0.0 standard drinks of  alcohol   Drug use: No   Sexual activity: Not Currently  Other Topics Concern   Not on file  Social History Narrative   Pt lives alone. Sister with dementia passed away 02/25/20.    Social Determinants of Health   Financial Resource Strain: Low Risk  (06/20/2021)   Overall Financial Resource Strain (CARDIA)    Difficulty of Paying Living Expenses: Not hard at all  Food Insecurity: No Food Insecurity (07/05/2022)   Hunger Vital Sign    Worried About Running Out of Food in the Last Year: Never true    Ran Out of Food in the Last Year: Never true  Transportation Needs: No Transportation Needs (07/05/2022)   PRAPARE - Hydrologist (Medical): No    Lack of Transportation (Non-Medical): No  Physical Activity: Sufficiently Active (07/05/2022)   Exercise Vital Sign    Days of Exercise per Week: 5 days    Minutes of Exercise per Session: 30 min  Stress: No Stress Concern Present (07/05/2022)   [place]    Feeling of Stress : Not at all  Social Connections: Moderately Integrated (07/05/2022)   Social Connection and Isolation Panel [NHANES]    Frequency of Communication with Friends and Family: More than three times a week    Frequency of Social Gatherings with Friends and Family: More than three times a week    Attends Religious Services: More than 4 times per year    Active Member of [employer] or Organizations: Yes    Attends Music therapist: More than 4 times per year    Marital Status: Divorced    Tobacco Counseling Counseling given: No Tobacco comments: smoking cessation materials not required   Clinical Intake:  Pre-visit preparation completed: No  Pain : No/denies pain Pain Score: 0-No pain     Nutritional Status: BMI 25 -29 Overweight Nutritional Risks: None Diabetes: No  How often do you need to have someone help you when you read instructions, pamphlets, or  other written materials from your doctor or pharmacy?: 1 - Never  Diabetic?No   Interpreter Needed?: No  Information entered by :: Donnie Mesa, CMA   Activities of Daily Living    07/05/2022   10:11 AM 11/20/2021    9:40 AM  In your present state of health, do you have any difficulty performing the following activities:  Hearing? 0 0  Vision? [place]   Difficulty concentrating or making decisions? 1 0  Walking or climbing stairs? 0 0  Dressing or bathing? 0 0  Doing errands, shopping? 0 0  Patient Care Team: Glean Hess, MD as PCP - General (Internal Medicine) Eulogio Bear, MD as Consulting Physician (Ophthalmology) Margaretha Sheffield, MD (Otolaryngology)  Indicate any recent Medical Services you may have received from other than [facility] providers in the past year (date may be approximate). The pt was seen at the [facility] on 03/26/2022.     Assessment:   This is a routine wellness examination for [place].  Hearing/Vision screen Denies any hearing issues.  Denies any worsening vision issues.   Dietary issues and exercise activities discussed: Current Exercise Habits: Structured exercise class, Type of exercise: walking, Time (Minutes): 30, Frequency (Times/Week): 5, Weekly Exercise (Minutes/Week): 150, Intensity: Moderate, Exercise limited by: None identified   Goals Addressed   None    Depression Screen    07/05/2022   10:09 AM 05/23/2022    9:45 AM 11/20/2021    9:40 AM 06/20/2021   11:23 AM 05/22/2021    9:21 AM 04/06/2021   10:49 AM 11/21/2020    8:56 AM  PHQ 2/9 Scores  PHQ - 2 Score 0 0 0 0 0 0 0  PHQ- 9 Score 0 0 1 0 0 1 0    Fall Risk    07/05/2022   10:11 AM 05/23/2022    9:45 AM 11/20/2021    9:40 AM 06/20/2021   11:24 AM 05/22/2021    9:21 AM  Fall Risk   Falls in the past year? 0 0 0 0 0  Number falls in past yr: 0 0 0 0 0  Injury with Fall? 0 0 0 0 0  Risk for fall due to : No Fall Risks No Fall Risks  No Fall Risks No Fall Risks No Fall Risks  Follow up Falls evaluation completed Falls evaluation completed Falls evaluation completed Falls prevention discussed Falls evaluation completed    FALL RISK PREVENTION PERTAINING TO THE HOME:  Any stairs in or around the home? Yes  If so, are there any without handrails? No  Home free of loose throw rugs in walkways, pet beds, electrical cords, etc? Yes  Adequate lighting in your home to reduce risk of falls? Yes   ASSISTIVE DEVICES UTILIZED TO PREVENT FALLS:  Life alert? No  Use of a cane, walker or w/c? No  Grab bars in the bathroom? Yes  Shower chair or bench in shower? Yes  Elevated toilet seat or a handicapped toilet? Yes   TIMED UP AND GO:  Was the test performed? Yes .  Length of time to ambulate 10 feet: 10 sec.   Gait steady and fast without use of assistive device  Cognitive Function:        07/05/2022   10:12 AM 05/10/2020   11:00 AM 05/10/2019   10:54 AM 01/19/2018   10:10 AM 01/16/2017   10:49 AM  6CIT Screen  What Year? 0 points 0 points 0 points 0 points 0 points  What month? 0 points 0 points 0 points 0 points 0 points  What time? 0 points 0 points 0 points 0 points 0 points  Count back from 20 2 points 0 points 0 points 0 points 0 points  Months in reverse 0 points 4 points 2 points 0 points 0 points  Repeat phrase 0 points 0 points 4 points 2 points 0 points  Total Score 2 points 4 points 6 points 2 points 0 points    Immunizations Immunization History  Administered Date(s) Administered   Fluad Quad(high Dose 65+) 05/10/2019, 05/10/2020, 04/06/2021,  05/23/2022   Influenza, High Dose Seasonal PF 05/23/2017, 04/17/2018   Influenza-Unspecified 05/11/2015, 05/23/2017, 06/22/2019   Moderna Sars-Covid-2 Vaccination 09/20/2019, 10/18/2019, 06/27/2020   Pneumococcal Conjugate-13 06/07/2015   Pneumococcal Polysaccharide-23 12/01/2012   Tdap 11/27/2009, 04/30/2022   Zoster Recombinat (Shingrix) 06/22/2019,  01/27/2020   Zoster, Live 01/11/2011    TDAP status: Up to date  Flu Vaccine status: Up to date  Pneumococcal vaccine status: Up to date  Covid-19 vaccine status: Information provided on how to obtain vaccines.   Qualifies for Shingles Vaccine? Yes   Zostavax completed Yes   Shingrix Completed?: Yes  Screening Tests Health Maintenance  Topic Date Due   COVID-19 Vaccine (4 - 2023-24 season) 03/29/2022   MAMMOGRAM  06/14/2022   Medicare Annual Wellness ([place])  07/06/2023   COLONOSCOPY (Pts 45-69yr Insurance coverage will need to be confirmed)  07/12/2026   DTaP/Tdap/Td (3 - Td or Tdap) 04/30/2032   Pneumonia Vaccine 76 Years old  Completed   INFLUENZA VACCINE  Completed   DEXA SCAN  Completed   Hepatitis C Screening  Completed   Zoster Vaccines- Shingrix  Completed   HPV VACCINES  Aged Out    Health Maintenance  Health Maintenance Due  Topic Date Due   COVID-19 Vaccine (4 - 2023-24 season) 03/29/2022   MAMMOGRAM  06/14/2022    Colorectal cancer screening: Type of screening: Colonoscopy. Completed 07/12/2021. Repeat every 5 years  Mammogram status: Ordered 05/23/2022. Pt provided with contact info and advised to call to schedule appt.   DEXA Scan: 06/13/2020  Lung Cancer Screening: (Low Dose CT Chest recommended if Age 76-80years, 30 pack-year currently smoking OR have quit w/in 15years.) does not qualify.   Lung Cancer Screening Referral: not applicable   Additional Screening:  Hepatitis C Screening: does qualify; Completed 12/05/2015  Vision Screening: Recommended annual ophthalmology exams for early detection of glaucoma and other disorders of the eye. Is the patient up to date with their annual eye exam?  Yes  Who is the provider or what is the name of the office in which the patient attends annual [facility] exams[facility] If pt is not established with a provider, would they like to be referred to a provider to establish care? No .    Dental Screening: Recommended annual dental exams for proper oral hygiene  Community Resource Referral / Chronic Care Management: CRR required this visit?  No   CCM required this visit?  No      Plan:     I have personally reviewed and noted the following in the patient's chart:   Medical and social history Use of alcohol, tobacco or illicit drugs  Current medications and supplements including opioid prescriptions. Patient is not currently taking opioid prescriptions. Functional ability and status Nutritional status Physical activity Advanced directives List of other physicians Hospitalizations, surgeries, and ER visits in previous 12 months Vitals Screenings to include cognitive, depression, and falls Referrals and appointments  In addition, I have reviewed and discussed with patient certain preventive protocols, quality metrics, and best practice recommendations. A written personalized care plan for preventive services as well as general preventive health recommendations were provided to patient.    Bethany Martinez, Thank you for taking time to come for your Medicare Wellness Visit. I appreciate your ongoing commitment to your health goals. Please review the following plan we discussed and let me know if I can assist you in the future.   These are the goals we discussed:  Goals      DIET - INCREASE WATER INTAKE     Recommend to drink at least 6-8 8oz glasses of water per day.     Increase physical activity     Recommend increasing physical activity to 150 minutes per week        This is a list of the screening recommended for you and due dates:  Health Maintenance  Topic Date Due   COVID-19 Vaccine (4 - 2023-24 season) 03/29/2022   Mammogram  06/14/2022   Medicare Annual Wellness Visit  07/06/2023   Colon Cancer Screening  07/12/2026   DTaP/Tdap/Td vaccine (3 - Td or Tdap) 04/30/2032   Pneumonia Vaccine  Completed   Flu Shot  Completed   DEXA scan (bone  density measurement)  Completed   Hepatitis C Screening: USPSTF Recommendation to screen - Ages 43-79 yo.  Completed   Zoster (Shingles) Vaccine  Completed   HPV Vaccine  Aged [address], [place]   07/05/2022   Nurse Notes: Approximately 30 minute Face -To-Face Medicare Wellness Visit. Blood pressure was elevated at today's AWV appointment, 155/98 and second bp 148/90. She admits to not taking her BP medication one day this week and also taking something for her allergies last night. She also complains of allergy symptoms that she associates with during yard work. She has an upcoming appt in 10/2022 to f/u on bp. I recommended that she continue to monitor her bp, and if it remains elevated, schedule a sooner appt. The patient agreed.

## 2022-07-10 ENCOUNTER — Encounter: Payer: Self-pay | Admitting: Internal Medicine

## 2022-07-10 ENCOUNTER — Ambulatory Visit (INDEPENDENT_AMBULATORY_CARE_PROVIDER_SITE_OTHER): Payer: Medicare Other | Admitting: Internal Medicine

## 2022-07-10 VITALS — BP 128/82 | Ht 61.0 in | Wt 156.0 lb

## 2022-07-10 DIAGNOSIS — Z23 Encounter for immunization: Secondary | ICD-10-CM

## 2022-07-10 DIAGNOSIS — R7303 Prediabetes: Secondary | ICD-10-CM | POA: Diagnosis not present

## 2022-07-10 DIAGNOSIS — I10 Essential (primary) hypertension: Secondary | ICD-10-CM | POA: Diagnosis not present

## 2022-07-10 DIAGNOSIS — E782 Mixed hyperlipidemia: Secondary | ICD-10-CM | POA: Diagnosis not present

## 2022-07-10 NOTE — Progress Notes (Signed)
Date:  07/10/2022   Name:  Bethany Martinez   DOB:  11/14/1945   MRN:  [id number]   Chief Complaint: Hypertension  Hypertension This is a chronic problem. The problem is controlled. Pertinent negatives include no chest pain, headaches, palpitations or shortness of breath. Past treatments include diuretics and angiotensin blockers. The current treatment provides significant improvement. There is no history of kidney disease, CAD/MI or CVA.  Her BP has been in the upper 130's to low 140's.  She feels well.  She is taking medications as directed.  Limited sodium intake, no caffeine.  Lab Results  Component Value Date   NA 140 05/23/2022   K 3.6 05/23/2022   CO2 24 05/23/2022   GLUCOSE 90 05/23/2022   BUN 12 05/23/2022   CREATININE 0.93 05/23/2022   CALCIUM 9.9 05/23/2022   EGFR 64 05/23/2022   GFRNONAA >60 03/26/2022   Lab Results  Component Value Date   CHOL 212 (H) 05/23/2022   HDL 57 05/23/2022   LDLCALC 134 (H) 05/23/2022   TRIG 119 05/23/2022   CHOLHDL 3.7 05/23/2022   Lab Results  Component Value Date   TSH 1.810 05/23/2022   Lab Results  Component Value Date   HGBA1C 6.1 (H) 05/23/2022   Lab Results  Component Value Date   WBC 6.3 05/23/2022   HGB 13.1 05/23/2022   HCT 38.8 05/23/2022   MCV 85 05/23/2022   PLT 329 05/23/2022   Lab Results  Component Value Date   ALT 15 05/23/2022   AST 14 05/23/2022   ALKPHOS 87 05/23/2022   BILITOT 0.9 05/23/2022   Lab Results  Component Value Date   VD25OH 23.0 (L) 05/18/2020     Review of Systems  Constitutional:  Negative for fatigue and unexpected weight change.  HENT:  Negative for nosebleeds.   Eyes:  Negative for visual disturbance.  Respiratory:  Negative for cough, chest tightness, shortness of breath and wheezing.   Cardiovascular:  Negative for chest pain, palpitations and leg swelling.  Gastrointestinal:  Negative for abdominal pain, constipation and diarrhea.  Neurological:  Negative for  dizziness, weakness, light-headedness and headaches.    Patient Active Problem List   Diagnosis Date Noted   Colon cancer screening    Polyp of descending colon    Age-related osteoporosis without fracture 05/18/2020   Mixed hyperlipidemia 05/18/2019   Eczema 07/18/2017   Prediabetes 06/07/2015   Edema extremities 01/11/2015   Allergic state 01/11/2015   Essential (primary) hypertension 01/11/2015    Allergies  Allergen Reactions   Atorvastatin Other (See Comments)    myalgia    Past Surgical History:  Procedure Laterality Date   ABDOMINAL HYSTERECTOMY  1988   partial   COLONOSCOPY  01/2009   Dr. Jamal Collin   COLONOSCOPY WITH PROPOFOL N/A 07/12/2021   Procedure: COLONOSCOPY WITH BIOPSY;  Surgeon: Lin Landsman, MD;  Location: [place];  Service: Endoscopy;  Laterality: N/A;   POLYPECTOMY N/A 07/12/2021   Procedure: POLYPECTOMY;  Surgeon: Lin Landsman, MD;  Location: [place];  Service: Endoscopy;  Laterality: N/A;    Social History   Tobacco Use   Smoking status: Never   Smokeless tobacco: Never   Tobacco comments:    smoking cessation materials not required  Vaping Use   Vaping Use: Never used  Substance Use Topics   Alcohol use: No    Alcohol/week: 0.0 standard drinks of alcohol   Drug use: No     Medication list has  been reviewed and updated.  Current Meds  Medication Sig   aspirin EC 81 MG tablet Take 81 mg by mouth daily. Patient taking 3 times per week   Calcium Carb-Cholecalciferol (LIQUID CALCIUM WITH D3) 600-12.5 MG-MCG CAPS Take by mouth.   hydrochlorothiazide (HYDRODIURIL) 25 MG tablet TAKE 1 TABLET (25 MG TOTAL) BY MOUTH DAILY.   latanoprost (XALATAN) 0.005 % ophthalmic solution SMARTSIG:In Eye(s)   losartan (COZAAR) 100 MG tablet TAKE 1 TABLET BY MOUTH EVERY DAY   rosuvastatin (CRESTOR) 5 MG tablet TAKE 1 TABLET BY MOUTH THREE TIMES A WEEK.   triamcinolone ointment (KENALOG) 0.1 % Apply 1 application topically 2  (two) times daily. To knees and elbows   TURMERIC CURCUMIN PO Take by mouth.   UNABLE TO FIND Med Name: Bone Strength Calcium w/ mag, C,D3 K2 and zinc       07/10/2022    1:50 PM 05/23/2022    9:45 AM 11/20/2021    9:40 AM 05/22/2021    9:21 AM  GAD 7 : Generalized Anxiety Score  Nervous, Anxious, on Edge 0 0 0 0  Control/stop worrying 0 0 0 0  Worry too much - different things 0 0 0 0  Trouble relaxing 0 0 0 0  Restless 0 0 0 0  Easily annoyed or irritable 0 0 0 0  Afraid - awful might happen 0 0 0 0  Total GAD 7 Score 0 0 0 0  Anxiety Difficulty Not difficult at all Not difficult at all  Not difficult at all       07/10/2022    1:50 PM 07/05/2022   10:09 AM 05/23/2022    9:45 AM  Depression screen PHQ 2/9  Decreased Interest 0 0 0  Down, Depressed, Hopeless 0 0 0  PHQ - 2 Score 0 0 0  Altered sleeping 0 0 0  Tired, decreased energy 0 0 0  Change in appetite 0 0 0  Feeling bad or failure about yourself  0 0 0  Trouble concentrating 0 0 0  Moving slowly or fidgety/restless 0 0 0  Suicidal thoughts 0 0 0  PHQ-9 Score 0 0 0  Difficult doing work/chores Not difficult at all Not difficult at all Not difficult at all    BP Readings from Last 3 Encounters:  07/10/22 138/82  07/05/22 (!) 148/90  05/23/22 126/76    Physical Exam Vitals and nursing note reviewed.  Constitutional:      General: She is not in acute distress.    Appearance: She is well-developed.  HENT:     Head: Normocephalic and atraumatic.  Cardiovascular:     Rate and Rhythm: Normal rate and regular rhythm.  Pulmonary:     Effort: Pulmonary effort is normal. No respiratory distress.     Breath sounds: No wheezing or rhonchi.  Musculoskeletal:     Cervical back: Normal range of motion.     Right lower leg: No edema.     Left lower leg: No edema.  Skin:    General: Skin is warm and dry.     Findings: No rash.  Neurological:     Mental Status: She is alert and oriented to person, place, and  time.  Psychiatric:        Mood and Affect: Mood normal.        Behavior: Behavior normal.     Wt Readings from Last 3 Encounters:  07/10/22 156 lb (70.8 kg)  07/05/22 156 lb 6.4 oz (70.9 kg)  05/23/22 153 lb 12.8 oz (69.8 kg)    BP 138/82 (BP Location: Left Arm)   Ht _0  (1.549 m)   Wt 156 lb (70.8 kg)   BMI 29.48 kg/m   Assessment and Plan: 1. Essential (primary) hypertension Moderately labile HTN but overall fairly well controlled. She will continue current therapy and monitor BP at home. If < 140/90 then will follow up as planned. (Her home device is 10 pts higher s/d) If higher, call for medication change from losartan to olmesartan.  2. Mixed hyperlipidemia Tolerating statin well.  3. Prediabetes Continue diet and exercise  4. Encounter for immunization [employer] Fall 2023 Covid-19 Vaccine 65yr and older   Partially dictated using DEditor, commissioning Any errors are unintentional.  LHalina Maidens MD [facility]  07/10/2022

## 2022-07-19 ENCOUNTER — Other Ambulatory Visit: Payer: Self-pay | Admitting: Internal Medicine

## 2022-07-19 DIAGNOSIS — E782 Mixed hyperlipidemia: Secondary | ICD-10-CM

## 2022-07-23 ENCOUNTER — Ambulatory Visit
Admission: RE | Admit: 2022-07-23 | Discharge: 2022-07-23 | Disposition: A | Discharge status: Home / Self Care | Payer: Medicare Other | Source: Ambulatory Visit | Attending: Internal Medicine | Admitting: Internal Medicine

## 2022-07-23 DIAGNOSIS — Z1231 Encounter for screening mammogram for malignant neoplasm of breast: Secondary | ICD-10-CM | POA: Insufficient documentation

## 2022-07-25 ENCOUNTER — Other Ambulatory Visit: Payer: Self-pay | Admitting: Internal Medicine

## 2022-07-25 DIAGNOSIS — I10 Essential (primary) hypertension: Secondary | ICD-10-CM

## 2022-07-30 DIAGNOSIS — L853 Xerosis cutis: Secondary | ICD-10-CM | POA: Diagnosis not present

## 2022-08-08 ENCOUNTER — Other Ambulatory Visit: Payer: Self-pay | Admitting: Internal Medicine

## 2022-08-08 DIAGNOSIS — I10 Essential (primary) hypertension: Secondary | ICD-10-CM

## 2022-08-08 NOTE — Telephone Encounter (Signed)
Requested Prescriptions  Pending Prescriptions Disp Refills   hydrochlorothiazide (HYDRODIURIL) 25 MG tablet [Pharmacy Med Name: HYDROCHLOROTHIAZIDE 25 MG TAB] 90 tablet 0    Sig: TAKE 1 TABLET (25 MG TOTAL) BY MOUTH DAILY.     Cardiovascular: Diuretics - Thiazide Passed - 08/08/2022  1:38 AM      Passed - Cr in normal range and within 180 days    Creatinine, Ser  Date Value Ref Range Status  05/23/2022 0.93 0.57 - 1.00 mg/dL Final         Passed - K in normal range and within 180 days    Potassium  Date Value Ref Range Status  05/23/2022 3.6 3.5 - 5.2 mmol/L Final         Passed - Na in normal range and within 180 days    Sodium  Date Value Ref Range Status  05/23/2022 140 134 - 144 mmol/L Final         Passed - Last BP in normal range    BP Readings from Last 1 Encounters:  07/10/22 128/82         Passed - Valid encounter within last 6 months    Recent Outpatient Visits           4 weeks ago Essential (primary) hypertension   [place] [facility] at [facility], Jesse Sans, MD   2 months ago Annual physical exam   [place] [facility] at [facility], Jesse Sans, MD   8 months ago Essential (primary) hypertension   [place] [facility] at [facility], Jesse Sans, MD   1 year ago COVID-19 virus infection    Primary [facility] and Sports Medicine at [facility], Jesse Sans, MD   1 year ago Annual physical exam   [facility] at [facility], Jesse Sans, MD       Future Appointments             In 3 months Army Melia, Jesse Sans, MD [facility] at [facility], [facility]   In 9 months Army Melia, Jesse Sans, MD [place] [facility] at [facility], [facility]

## 2022-08-27 DIAGNOSIS — H40003 Preglaucoma, unspecified, bilateral: Secondary | ICD-10-CM | POA: Diagnosis not present

## 2022-08-27 DIAGNOSIS — H2513 Age-related nuclear cataract, bilateral: Secondary | ICD-10-CM | POA: Diagnosis not present

## 2022-10-15 DIAGNOSIS — M3501 Sicca syndrome with keratoconjunctivitis: Secondary | ICD-10-CM | POA: Diagnosis not present

## 2022-11-03 ENCOUNTER — Other Ambulatory Visit: Payer: Self-pay | Admitting: Internal Medicine

## 2022-11-03 DIAGNOSIS — I10 Essential (primary) hypertension: Secondary | ICD-10-CM

## 2022-11-06 ENCOUNTER — Other Ambulatory Visit: Payer: Self-pay | Admitting: Internal Medicine

## 2022-11-06 DIAGNOSIS — E782 Mixed hyperlipidemia: Secondary | ICD-10-CM

## 2022-11-07 NOTE — Telephone Encounter (Signed)
Requested Prescriptions  Pending Prescriptions Disp Refills   rosuvastatin (CRESTOR) 5 MG tablet [Pharmacy Med Name: ROSUVASTATIN CALCIUM 5 MG TAB] 36 tablet 1    Sig: TAKE 1 TABLET BY MOUTH THREE TIMES A WEEK.     Cardiovascular:  Antilipid - Statins 2 Failed - 11/06/2022  4:34 PM      Failed - Lipid Panel in normal range within the last 12 months    Cholesterol, Total  Date Value Ref Range Status  05/23/2022 212 (H) 100 - 199 mg/dL Final   LDL Chol Calc (NIH)  Date Value Ref Range Status  05/23/2022 134 (H) 0 - 99 mg/dL Final   HDL  Date Value Ref Range Status  05/23/2022 57 >39 mg/dL Final   Triglycerides  Date Value Ref Range Status  05/23/2022 119 0 - 149 mg/dL Final         Passed - Cr in normal range and within 360 days    Creatinine, Ser  Date Value Ref Range Status  05/23/2022 0.93 0.57 - 1.00 mg/dL Final         Passed - Patient is not pregnant      Passed - Valid encounter within last 12 months    Recent Outpatient Visits           4 months ago Essential (primary) hypertension   [place] [facility] at [facility], Nyoka Cowden, MD   5 months ago Annual physical exam   [facility] at [facility], Nyoka Cowden, MD   11 months ago Essential (primary) hypertension   [place] [facility] at [facility], Nyoka Cowden, MD   1 year ago COVID-19 virus infection   [facility] at [facility], Nyoka Cowden, MD   1 year ago Annual physical exam   [facility] Medicine at [facility], Nyoka Cowden, MD       Future Appointments             In 2 weeks Judithann Graves, Nyoka Cowden, MD [facility] Primary [facility] & Sports Medicine at [facility], [facility]   In 6 months Judithann Graves, Nyoka Cowden, MD [facility] Medicine at [facility], [facility]

## 2022-11-08 DIAGNOSIS — H2513 Age-related nuclear cataract, bilateral: Secondary | ICD-10-CM | POA: Diagnosis not present

## 2022-11-08 DIAGNOSIS — H401131 Primary open-angle glaucoma, bilateral, mild stage: Secondary | ICD-10-CM | POA: Diagnosis not present

## 2022-11-22 ENCOUNTER — Ambulatory Visit (INDEPENDENT_AMBULATORY_CARE_PROVIDER_SITE_OTHER): Payer: Medicare Other | Admitting: Internal Medicine

## 2022-11-22 ENCOUNTER — Encounter: Payer: Self-pay | Admitting: Internal Medicine

## 2022-11-22 VITALS — BP 125/70 | Ht 61.0 in | Wt 155.0 lb

## 2022-11-22 DIAGNOSIS — R7303 Prediabetes: Secondary | ICD-10-CM

## 2022-11-22 DIAGNOSIS — E782 Mixed hyperlipidemia: Secondary | ICD-10-CM

## 2022-11-22 DIAGNOSIS — I10 Essential (primary) hypertension: Secondary | ICD-10-CM

## 2022-11-22 NOTE — Assessment & Plan Note (Signed)
Clinically stable exam with well controlled BP on losartan and hctz. Tolerating medications without side effects. Pt to continue current regimen and low sodium diet.  

## 2022-11-22 NOTE — Progress Notes (Signed)
Date:  11/22/2022   Name:  Bethany Martinez   DOB:  July 17, 1946   MRN:  [id number]   Chief Complaint: Hypertension, Diabetes, and Hyperlipidemia  Hypertension This is a chronic problem. The problem is controlled. Pertinent negatives include no chest pain, headaches, palpitations or shortness of breath. Past treatments include angiotensin blockers and diuretics. The current treatment provides significant improvement. There is no history of kidney disease, CAD/MI or CVA.  Hyperlipidemia This is a chronic problem. Recent lipid tests were reviewed and are high. Pertinent negatives include no chest pain or shortness of breath. Current antihyperlipidemic treatment includes statins. The current treatment provides moderate improvement of lipids.  Diabetes She presents for her follow-up diabetic visit. Diabetes type: prediabetes. Her disease course has been stable. Pertinent negatives for hypoglycemia include no dizziness or headaches. Pertinent negatives for diabetes include no chest pain, no fatigue and no weakness. Pertinent negatives for diabetic complications include no CVA.    Lab Results  Component Value Date   NA 140 05/23/2022   K 3.6 05/23/2022   CO2 24 05/23/2022   GLUCOSE 90 05/23/2022   BUN 12 05/23/2022   CREATININE 0.93 05/23/2022   CALCIUM 9.9 05/23/2022   EGFR 64 05/23/2022   GFRNONAA >60 03/26/2022   Lab Results  Component Value Date   CHOL 212 (H) 05/23/2022   HDL 57 05/23/2022   LDLCALC 134 (H) 05/23/2022   TRIG 119 05/23/2022   CHOLHDL 3.7 05/23/2022   Lab Results  Component Value Date   TSH 1.810 05/23/2022   Lab Results  Component Value Date   HGBA1C 6.1 (H) 05/23/2022   Lab Results  Component Value Date   WBC 6.3 05/23/2022   HGB 13.1 05/23/2022   HCT 38.8 05/23/2022   MCV 85 05/23/2022   PLT 329 05/23/2022   Lab Results  Component Value Date   ALT 15 05/23/2022   AST 14 05/23/2022   ALKPHOS 87 05/23/2022   BILITOT 0.9 05/23/2022   Lab  Results  Component Value Date   VD25OH 23.0 (L) 05/18/2020     Review of Systems  Constitutional:  Negative for fatigue and unexpected weight change.  HENT:  Negative for nosebleeds.   Eyes:  Negative for visual disturbance.  Respiratory:  Negative for cough, chest tightness, shortness of breath and wheezing.   Cardiovascular:  Negative for chest pain, palpitations and leg swelling.  Gastrointestinal:  Negative for abdominal pain, constipation and diarrhea.  Neurological:  Negative for dizziness, weakness, light-headedness and headaches.    Patient Active Problem List   Diagnosis Date Noted   Colon cancer screening    Polyp of descending colon    Age-related osteoporosis without fracture 05/18/2020   Mixed hyperlipidemia 05/18/2019   Eczema 07/18/2017   Prediabetes 06/07/2015   Edema extremities 01/11/2015   Allergic state 01/11/2015   Essential (primary) hypertension 01/11/2015    Allergies  Allergen Reactions   Atorvastatin Other (See Comments)    myalgia    Past Surgical History:  Procedure Laterality Date   ABDOMINAL HYSTERECTOMY  1988   partial   COLONOSCOPY  01/2009   Dr. Evette Cristal   COLONOSCOPY WITH PROPOFOL N/A 07/12/2021   Procedure: COLONOSCOPY WITH BIOPSY;  Surgeon: Toney Reil, MD;  Location[facility];  Service: Endoscopy;  Laterality: N/A;   POLYPECTOMY N/A 07/12/2021   Procedure: POLYPECTOMY;  Surgeon: Toney Reil, MD;  Location: [facility];  Service: Endoscopy;  Laterality: N/A;    Social History   Tobacco  Use   Smoking status: Never   Smokeless tobacco: Never   Tobacco comments:    smoking cessation materials not required  Vaping Use   Vaping Use: Never used  Substance Use Topics   Alcohol use: No    Alcohol/week: 0.0 standard drinks of alcohol   Drug use: No     Medication list has been reviewed and updated.  Current Meds  Medication Sig   aspirin EC 81 MG tablet Take 81 mg by mouth daily. Patient  taking 3 times per week   Calcium Carb-Cholecalciferol (SUPER CALCIUM 600 + D 400 PO) Take 1 tablet by mouth 2 (two) times daily.   hydrochlorothiazide (HYDRODIURIL) 25 MG tablet TAKE 1 TABLET (25 MG TOTAL) BY MOUTH DAILY.   latanoprost (XALATAN) 0.005 % ophthalmic solution SMARTSIG:In Eye(s)   losartan (COZAAR) 100 MG tablet TAKE 1 TABLET BY MOUTH EVERY DAY   rosuvastatin (CRESTOR) 5 MG tablet TAKE 1 TABLET BY MOUTH THREE TIMES A WEEK.   triamcinolone ointment (KENALOG) 0.1 % Apply 1 application topically 2 (two) times daily. To knees and elbows   TURMERIC CURCUMIN PO Take by mouth.   UNABLE TO FIND Med Name: Bone Strength Calcium w/ mag, C,D3 K2 and zinc   [DISCONTINUED] Calcium Carb-Cholecalciferol (LIQUID CALCIUM WITH D3) 600-12.5 MG-MCG CAPS Take by mouth.       11/22/2022    9:33 AM 07/10/2022    1:50 PM 05/23/2022    9:45 AM 11/20/2021    9:40 AM  GAD 7 : Generalized Anxiety Score  Nervous, Anxious, on Edge 0 0 0 0  Control/stop worrying 0 0 0 0  Worry too much - different things 0 0 0 0  Trouble relaxing 0 0 0 0  Restless 0 0 0 0  Easily annoyed or irritable 0 0 0 0  Afraid - awful might happen 0 0 0 0  Total GAD 7 Score 0 0 0 0  Anxiety Difficulty Not difficult at all Not difficult at all Not difficult at all        11/22/2022    9:33 AM 07/10/2022    1:50 PM 07/05/2022   10:09 AM  Depression screen PHQ 2/9  Decreased Interest 0 0 0  Down, Depressed, Hopeless 0 0 0  PHQ - 2 Score 0 0 0  Altered sleeping 0 0 0  Tired, decreased energy 0 0 0  Change in appetite 0 0 0  Feeling bad or failure about yourself  0 0 0  Trouble concentrating 0 0 0  Moving slowly or fidgety/restless 0 0 0  Suicidal thoughts 0 0 0  PHQ-9 Score 0 0 0  Difficult doing work/chores Not difficult at all Not difficult at all Not difficult at all    BP Readings from Last 3 Encounters:  11/22/22 125/70  07/10/22 128/82  07/05/22 (!) 148/90    Physical Exam Vitals and nursing note reviewed.   Constitutional:      General: She is not in acute distress.    Appearance: She is well-developed.  HENT:     Head: Normocephalic and atraumatic.  Neck:     Vascular: No carotid bruit.  Cardiovascular:     Rate and Rhythm: Normal rate and regular rhythm.     Pulses: Normal pulses.     Heart sounds: No murmur heard. Pulmonary:     Effort: Pulmonary effort is normal. No respiratory distress.  Musculoskeletal:     Cervical back: Normal range of motion.     Right  lower leg: No edema.     Left lower leg: No edema.  Lymphadenopathy:     Cervical: No cervical adenopathy.  Skin:    General: Skin is warm and dry.     Findings: No rash.  Neurological:     Mental Status: She is alert and oriented to person, place, and time.  Psychiatric:        Mood and Affect: Mood normal.        Behavior: Behavior normal.     Wt Readings from Last 3 Encounters:  11/22/22 155 lb (70.3 kg)  07/10/22 156 lb (70.8 kg)  07/05/22 156 lb 6.4 oz (70.9 kg)    BP 125/70 (BP Location: Right Arm, Cuff Size: Large)   Ht 5\' 1"  (1.549 m)   Wt 155 lb (70.3 kg)   BMI 29.29 kg/m   Assessment and Plan:  Problem List Items Addressed This Visit       Cardiovascular and Mediastinum   Essential (primary) hypertension - Primary (Chronic)    Clinically stable exam with well controlled BP on losartan and hctz. Tolerating medications without side effects. Pt to continue current regimen and low sodium diet.         Other   Mixed hyperlipidemia (Chronic)    Cholesterol much improved on low dose Crestor tiw. No side effects noted. Lab Results  Component Value Date   LDLCALC 134 (H) 05/23/2022        Prediabetes (Chronic)    Controlled with diet.  Lab Results  Component Value Date   HGBA1C 6.1 (H) 05/23/2022         No follow-ups on file.   Partially dictated using Dragon software, any errors are not intentional.  Reubin Milan, MD [facility] Primary Care and Sports Medicine [place],  [place]

## 2022-11-22 NOTE — Patient Instructions (Signed)
-  It was a pleasure to see you today! Please review your visit summary for helpful information. -Lab results are usually available within 1-2 days and we will call once reviewed. -I would encourage you to follow your care via MyChart where you can access lab results, notes, messages, and more. -If you feel that we did a nice job today, please complete your after-visit survey and leave us a Google review! Your CMA today was Shealyn Sean and your provider was Dr Laura Berglund, MD.  

## 2022-11-22 NOTE — Assessment & Plan Note (Addendum)
Cholesterol much improved on low dose Crestor tiw. No side effects noted. Lab Results  Component Value Date   LDLCALC 134 (H) 05/23/2022

## 2022-11-22 NOTE — Assessment & Plan Note (Signed)
Controlled with diet.  Lab Results  Component Value Date   HGBA1C 6.1 (H) 05/23/2022

## 2022-11-27 DIAGNOSIS — H2512 Age-related nuclear cataract, left eye: Secondary | ICD-10-CM | POA: Diagnosis not present

## 2022-12-03 ENCOUNTER — Encounter: Payer: Self-pay | Admitting: Ophthalmology

## 2022-12-04 NOTE — Discharge Instructions (Signed)

## 2022-12-04 NOTE — Anesthesia Preprocedure Evaluation (Addendum)
Anesthesia Evaluation  Patient identified by MRN, date of birth, ID band Patient awake    Reviewed: Allergy & Precautions, H&P , NPO status , Patient's Chart, lab work & pertinent test results  Airway Mallampati: III  TM Distance: >3 FB Neck ROM: Full    Dental no notable dental hx.    Pulmonary neg pulmonary ROS   Pulmonary exam normal breath sounds clear to auscultation       Cardiovascular hypertension, negative cardio ROS Normal cardiovascular exam Rhythm:Regular Rate:Normal     Neuro/Psych negative neurological ROS  negative psych ROS   GI/Hepatic negative GI ROS, Neg liver ROS,,,  Endo/Other  Pre-diabetes   Renal/GU negative Renal ROS  negative genitourinary   Musculoskeletal negative musculoskeletal ROS (+)    Abdominal   Peds negative pediatric ROS (+)  Hematology negative hematology ROS (+)   Anesthesia Other Findings Glaucoma Diabetes mellitus without complication Hypertension Hyperlipidemia     Reproductive/Obstetrics negative OB ROS                              Anesthesia Physical Anesthesia Plan  ASA: 2  Anesthesia Plan: MAC   Post-op Pain Management:    Induction: Intravenous  PONV Risk Score and Plan:   Airway Management Planned: Natural Airway and Nasal Cannula  Additional Equipment:   Intra-op Plan:   Post-operative Plan:   Informed Consent: I have reviewed the patients History and Physical, chart, labs and discussed the procedure including the risks, benefits and alternatives for the proposed anesthesia with the patient or authorized representative who has indicated his/her understanding and acceptance.     Dental Advisory Given  Plan Discussed with: Anesthesiologist, CRNA and Surgeon  Anesthesia Plan Comments: (Patient consented for risks of anesthesia including but not limited to:  - adverse reactions to medications - damage to eyes, teeth,  lips or other oral mucosa - nerve damage due to positioning  - sore throat or hoarseness - Damage to heart, brain, nerves, lungs, other parts of body or loss of life  Patient voiced understanding.)         Anesthesia Quick Evaluation

## 2022-12-09 ENCOUNTER — Encounter: Payer: Self-pay | Admitting: Ophthalmology

## 2022-12-09 ENCOUNTER — Encounter
Admission: RE | Disposition: A | Discharge status: Home / Self Care | Payer: Self-pay | Source: Home / Self Care | Attending: Ophthalmology

## 2022-12-09 ENCOUNTER — Ambulatory Visit
Admission: RE | Admit: 2022-12-09 | Discharge: 2022-12-09 | Disposition: A | Discharge status: Home / Self Care | Payer: Medicare Other | Attending: Ophthalmology | Admitting: Ophthalmology

## 2022-12-09 ENCOUNTER — Ambulatory Visit: Payer: Medicare Other | Admitting: Anesthesiology

## 2022-12-09 ENCOUNTER — Other Ambulatory Visit: Payer: Self-pay

## 2022-12-09 DIAGNOSIS — I10 Essential (primary) hypertension: Secondary | ICD-10-CM | POA: Insufficient documentation

## 2022-12-09 DIAGNOSIS — H401121 Primary open-angle glaucoma, left eye, mild stage: Secondary | ICD-10-CM | POA: Insufficient documentation

## 2022-12-09 DIAGNOSIS — E1136 Type 2 diabetes mellitus with diabetic cataract: Secondary | ICD-10-CM | POA: Diagnosis not present

## 2022-12-09 DIAGNOSIS — H269 Unspecified cataract: Secondary | ICD-10-CM | POA: Diagnosis not present

## 2022-12-09 DIAGNOSIS — E785 Hyperlipidemia, unspecified: Secondary | ICD-10-CM | POA: Insufficient documentation

## 2022-12-09 DIAGNOSIS — H2512 Age-related nuclear cataract, left eye: Secondary | ICD-10-CM | POA: Insufficient documentation

## 2022-12-09 HISTORY — PX: CATARACT EXTRACTION W/PHACO: SHX586

## 2022-12-09 SURGERY — PHACOEMULSIFICATION, CATARACT, WITH IOL INSERTION
Anesthesia: Monitor Anesthesia Care | Site: Eye | Laterality: Left

## 2022-12-09 MED ORDER — LIDOCAINE HCL (PF) 2 % IJ SOLN
INTRAOCULAR | Status: DC | PRN
  Administered 2022-12-09: 1 mL via INTRAOCULAR

## 2022-12-09 MED ORDER — SIGHTPATH DOSE#1 BSS IO SOLN
INTRAOCULAR | Status: DC | PRN
  Administered 2022-12-09: 90 mL via OPHTHALMIC

## 2022-12-09 MED ORDER — SIGHTPATH DOSE#1 BSS IO SOLN
INTRAOCULAR | Status: DC | PRN
  Administered 2022-12-09: 15 mL

## 2022-12-09 MED ORDER — LACTATED RINGERS IV SOLN: INTRAVENOUS | Status: DC

## 2022-12-09 MED ORDER — MOXIFLOXACIN HCL 0.5 % OP SOLN
OPHTHALMIC | Status: DC | PRN
  Administered 2022-12-09: .2 mL via OPHTHALMIC

## 2022-12-09 MED ORDER — ARMC OPHTHALMIC DILATING DROPS
1.0000 | OPHTHALMIC | Status: DC | PRN
  Administered 2022-12-09 (×3): 1 via OPHTHALMIC

## 2022-12-09 MED ORDER — MIDAZOLAM HCL 2 MG/2ML IJ SOLN
INTRAMUSCULAR | Status: DC | PRN
  Administered 2022-12-09: 1 mg via INTRAVENOUS

## 2022-12-09 MED ORDER — FENTANYL CITRATE (PF) 100 MCG/2ML IJ SOLN
INTRAMUSCULAR | Status: DC | PRN
  Administered 2022-12-09 (×2): 50 ug via INTRAVENOUS

## 2022-12-09 MED ORDER — SIGHTPATH DOSE#1 NA HYALUR & NA CHOND-NA HYALUR IO KIT
PACK | INTRAOCULAR | Status: DC | PRN
  Administered 2022-12-09: 1 via OPHTHALMIC

## 2022-12-09 MED ORDER — TETRACAINE HCL 0.5 % OP SOLN
1.0000 [drp] | OPHTHALMIC | Status: DC | PRN
  Administered 2022-12-09 (×3): 1 [drp] via OPHTHALMIC

## 2022-12-09 SURGICAL SUPPLY — 15 items
CATARACT SUITE SIGHTPATH (MISCELLANEOUS) ×1 IMPLANT
DISSECTOR HYDRO NUCLEUS 50X22 (MISCELLANEOUS) ×1 IMPLANT
FEE CATARACT SUITE SIGHTPATH (MISCELLANEOUS) ×1 IMPLANT
GLOVE SURG GAMMEX PI TX LF 7.5 (GLOVE) ×1 IMPLANT
GLOVE SURG SYN 8.5  E (GLOVE) ×1
GLOVE SURG SYN 8.5 E (GLOVE) ×1 IMPLANT
GLOVE SURG SYN 8.5 PF PI (GLOVE) ×1 IMPLANT
LENS CLAREON VIVITY TORIC 22.5 ×1 IMPLANT
LENS CLRN VIVITY TORIC 4 22.5 ×1 IMPLANT
LENS IOL CLRN VT TRC 4 22.5 IMPLANT
NDL FILTER BLUNT 18X1 1/2 (NEEDLE) ×1 IMPLANT
NEEDLE FILTER BLUNT 18X1 1/2 (NEEDLE) ×1 IMPLANT
STENT OPTH STRL GLAUCOMA IMPLANT
SYR 3ML LL SCALE MARK (SYRINGE) ×1 IMPLANT
SYR 5ML LL (SYRINGE) ×1 IMPLANT

## 2022-12-09 NOTE — H&P (Signed)
[facility]   Primary Care Physician:  Reubin Milan, MD Ophthalmologist: Dr. Willey Blade  Pre-Procedure History & Physical: HPI:  Bethany Martinez is a 77 y.o. female here for cataract surgery.   Past Medical History:  Diagnosis Date   Allergy    Diabetes mellitus without complication (HCC)    "borderline"   Encounter for dental exam and cleaning w/o abnormal findings 05/18/2018   Glaucoma    Hyperlipidemia    Hypertension     Past Surgical History:  Procedure Laterality Date   ABDOMINAL HYSTERECTOMY  1988   partial   COLONOSCOPY  01/2009   Dr. Evette Cristal   COLONOSCOPY WITH PROPOFOL N/A 07/12/2021   Procedure: COLONOSCOPY WITH BIOPSY;  Surgeon: Toney Reil, MD;  Location: [facility];  Service: Endoscopy;  Laterality: N/A;   POLYPECTOMY N/A 07/12/2021   Procedure: POLYPECTOMY;  Surgeon: Toney Reil, MD;  Location: [facility];  Service: Endoscopy;  Laterality: N/A;    Prior to Admission medications   Medication Sig Start Date End Date Taking? Authorizing Provider  aspirin EC 81 MG tablet Take 81 mg by mouth daily. Patient taking 3 times per week   Yes [provider]  Calcium Carb-Cholecalciferol (SUPER CALCIUM 600 + D 400 PO) Take 1 tablet by mouth 2 (two) times daily.   Yes [provider]  Garlic 1000 MG CAPS Take by mouth daily.   Yes [provider]  hydrochlorothiazide (HYDRODIURIL) 25 MG tablet TAKE 1 TABLET (25 MG TOTAL) BY MOUTH DAILY. 11/03/22  Yes Reubin Milan, MD  latanoprost (XALATAN) 0.005 % ophthalmic solution SMARTSIG:In Eye(s) 06/27/22  Yes [provider]  losartan (COZAAR) 100 MG tablet TAKE 1 TABLET BY MOUTH EVERY DAY 07/25/22  Yes Reubin Milan, MD  Omega-3 Fatty Acids (OMEGA 3 500 PO) Take by mouth daily.   Yes [provider]  triamcinolone ointment (KENALOG) 0.1 % Apply 1 application topically 2 (two) times daily. To knees and elbows 04/06/21  Yes Reubin Milan, MD  TURMERIC CURCUMIN PO Take by mouth.   Yes [provider]  rosuvastatin (CRESTOR) 5 MG tablet TAKE 1 TABLET BY MOUTH THREE TIMES A WEEK. Patient not taking: Reported on 12/03/2022 07/19/22   Reubin Milan, MD    Allergies as of 11/22/2022 - Review Complete 11/22/2022  Allergen Reaction Noted   Atorvastatin Other (See Comments) 11/21/2020    Family History  Problem Relation Age of Onset   Hypertension Mother    Hypertension Father    Breast cancer Sister 79   Cancer Sister        colon   Dementia Sister    Breast cancer Paternal Aunt 10   Heart disease Brother        CABG    Social History   Socioeconomic History   Marital status: Divorced    Spouse name: Not on file   Number of children: 2   Years of education: some college   Highest education level: Bachelor's degree (e.g., BA, AB, BS)  Occupational History   Occupation: Retired  Tobacco Use   Smoking status: Never   Smokeless tobacco: Never   Tobacco comments:    smoking cessation materials not required  Vaping Use   Vaping Use: Never used  Substance and Sexual Activity   Alcohol use: No    Alcohol/week: 0.0 standard drinks of alcohol   Drug use: No   Sexual activity: Not Currently  Other Topics Concern  Not on file  Social History Narrative   Pt lives alone. Sister with dementia passed away 02/24/20.    Social Determinants of Health   Financial Resource Strain: Low Risk  (11/19/2022)   Overall Financial Resource Strain (CARDIA)    Difficulty of Paying Living Expenses: Not hard at all  Food Insecurity: No Food Insecurity (11/19/2022)   Hunger Vital Sign    Worried About Running Out of Food in the Last Year: Never true    Ran Out of Food in the Last Year: Never true  Transportation Needs: No Transportation Needs (11/19/2022)   PRAPARE - Administrator, Civil Service (Medical): No    Lack of Transportation (Non-Medical): No  Physical Activity: Insufficiently Active  (11/19/2022)   Exercise Vital Sign    Days of Exercise per Week: 3 days    Minutes of Exercise per Session: 30 min  Stress: No Stress Concern Present (11/19/2022)   [employer] of Occupational Health - Occupational Stress Questionnaire    Feeling of Stress : Not at all  Social Connections: Moderately Integrated (11/19/2022)   Social Connection and Isolation Panel [NHANES]    Frequency of Communication with Friends and Family: More than three times a week    Frequency of Social Gatherings with Friends and Family: More than three times a week    Attends Religious Services: More than 4 times per year    Active Member of [employer] or Organizations: Yes    Attends Engineer, structural: More than 4 times per year    Marital Status: Divorced  Intimate Partner Violence: Not At Risk (07/05/2022)   Humiliation, Afraid, Rape, and Kick questionnaire    Fear of Current or Ex-Partner: No    Emotionally Abused: No    Physically Abused: No    Sexually Abused: No    Review of Systems: See HPI, otherwise negative ROS  Physical Exam: BP (!) 142/76   Pulse 73   Temp 98.2 F (36.8 C) (Temporal)   Resp 16   Ht 5\' 1"  (1.549 m)   Wt 68 kg   SpO2 100%   BMI 28.34 kg/m  General:   Alert, cooperative in NAD Head:  Normocephalic and atraumatic. Respiratory:  Normal work of breathing. Cardiovascular:  RRR  Impression/Plan: Bethany Martinez is here for cataract surgery.  Risks, benefits, limitations, and alternatives regarding cataract surgery have been reviewed with the patient.  Questions have been answered.  All parties agreeable.   Willey Blade, MD  12/09/2022, 9:14 AM

## 2022-12-09 NOTE — Transfer of Care (Signed)
Immediate Anesthesia Transfer of Care Note  Patient: Bethany Martinez  Procedure(s) Performed: CATARACT EXTRACTION PHACO AND INTRAOCULAR LENS PLACEMENT (IOC) LEFT HYDRUS MICROSTENT  CLAREON VIVITY TORIC LENS  3.89  00:31.1 (Left: Eye)  Patient Location: PACU  Anesthesia Type: MAC  Level of Consciousness: awake, alert  and patient cooperative  Airway and Oxygen Therapy: Patient Spontanous Breathing and Patient connected to supplemental oxygen  Post-op Assessment: Post-op Vital signs reviewed, Patient's Cardiovascular Status Stable, Respiratory Function Stable, Patent Airway and No signs of Nausea or vomiting  Post-op Vital Signs: Reviewed and stable  Complications: No notable events documented.

## 2022-12-09 NOTE — Op Note (Signed)
OPERATIVE NOTE  Bethany Martinez [id number] 12/09/2022  PREOPERATIVE DIAGNOSIS:   1.  Mild  PRIMARY open angle glaucoma, left eye. O13.0865 2.  Nuclear sclerotic cataract left eye.  H25.12   POSTOPERATIVE DIAGNOSIS:    same.   PROCEDURE:   1.  Placement of trabecular bypass stent (hydrus) and phacoemusification with posterior chamber intraocular lens placement of the left eye  CPT [phone]   LENS: Implant Name Type Inv. Item Serial No. Manufacturer Lot No. LRB No. Used Action  STENT OPTH STRL GLAUCOMA - [id number]  STENT OPTH STRL GLAUCOMA  [employer] [id number] Left 1 Implanted  LENS CLAREON VIVITY TORIC 22.5 - [id number]  LENS CLAREON VIVITY TORIC 22.5 [id number] SIGHTPATH  Left 1 Implanted      Procedure(s): CATARACT EXTRACTION PHACO AND INTRAOCULAR LENS PLACEMENT (IOC) LEFT HYDRUS MICROSTENT  CLAREON VIVITY TORIC LENS  3.89  00:31.1 (Left)  CNWET4 +22.5 vivity toric lens  ULTRASOUND TIME: 0 minutes 31 seconds.  CDE 3.89   SURGEON:  Willey Blade, MD, MPH  ANESTHESIOLOGIST: Anesthesiologist: Marisue Humble, MD CRNA: Barbette Hair, CRNA   ANESTHESIA:  MAC  and intracameral preservative-free intracameral lidocaine 4%.  ESTIMATED BLOOD LOSS: less than 1 mL.   COMPLICATIONS:  None.   DESCRIPTION OF PROCEDURE:  The patient was identified in the holding room and transported to the operating room.  The patient was placed in the supine position under the operating microscope.  The left eye was prepped and draped in the usual sterile ophthalmic fashion.  The verion system was registered without difficulty.   A 1.0 millimeter clear-corneal paracentesis was made at the 4:30 position, and a second paracentesis was made at 1:30 for the hydrus. 0.5 ml of preservative-free 1% lidocaine with epinephrine was injected into the anterior chamber.  The anterior chamber was filled with Healon 5 viscoelastic.  A 2.4 millimeter keratome was used to make a near-clear corneal incision at the  2:00 position.   Attention was turned to the hydrus stent.  The patients head was turned to the left and the microscope was tilted to 035 degrees.  Ocular instruments/Glaukos OAL/H2 gonioprism was used with Healon 5 on the cornea to visualize the trabecular meshwork. The hydrus was introduced into the eye and the  meshwork was engaged with the tip of the and the stent was deployed into Schlemm's canal.  The stent was well seated and in good position.  Next, attention was turned to the phacoemulsification A curvilinear capsulorrhexis was made with a cystotome and capsulorrhexis forceps.  Balanced salt solution was used to hydrodissect and hydrodelineate the nucleus.   Phacoemulsification was then used in stop and chop fashion to remove the lens nucleus and epinucleus.  The remaining cortex was then removed using the irrigation and aspiration handpiece. Healon was then placed into the capsular bag to distend it for lens placement.  A lens was then injected into the capsular bag and rotated to the axis with guidance from the verion system.  The remaining viscoelastic was aspirated.   Wounds were hydrated with balanced salt solution.  The anterior chamber was inflated to a physiologic pressure with balanced salt solution. There was a small amount of heme in the anterior chamber.  Intracameral vigamox 0.1 mL undiluted was injected into the eye and a drop placed onto the ocular surface.  No wound leaks were noted.  Protective glasses were placed on the patient.  The patient was taken to the recovery room in stable condition without complications of anesthesia or  surgery   Willey Blade 12/09/2022, 9:51 AM

## 2022-12-09 NOTE — Anesthesia Postprocedure Evaluation (Signed)
Anesthesia Post Note  Patient: Cheryal Goldfine  Procedure(s) Performed: CATARACT EXTRACTION PHACO AND INTRAOCULAR LENS PLACEMENT (IOC) LEFT HYDRUS MICROSTENT  CLAREON VIVITY TORIC LENS  3.89  00:31.1 (Left: Eye)  Patient location during evaluation: PACU Anesthesia Type: MAC Level of consciousness: awake and alert Pain management: pain level controlled Vital Signs Assessment: post-procedure vital signs reviewed and stable Respiratory status: spontaneous breathing, nonlabored ventilation, respiratory function stable and patient connected to nasal cannula oxygen Cardiovascular status: stable and blood pressure returned to baseline Postop Assessment: no apparent nausea or vomiting Anesthetic complications: no   No notable events documented.   Last Vitals:  Vitals:   12/09/22 0951 12/09/22 0957  BP: (!) 133/58   Pulse: 64 74  Resp: 12 16  Temp: 36.9 C 36.9 C  SpO2: 100% 99%    Last Pain:  Vitals:   12/09/22 0957  TempSrc:   PainSc: 0-No pain                 Manvir Prabhu C Vinetta Brach

## 2022-12-10 ENCOUNTER — Other Ambulatory Visit: Payer: Self-pay

## 2022-12-10 ENCOUNTER — Encounter: Payer: Self-pay | Admitting: Ophthalmology

## 2022-12-10 DIAGNOSIS — H2511 Age-related nuclear cataract, right eye: Secondary | ICD-10-CM | POA: Diagnosis not present

## 2022-12-10 DIAGNOSIS — H401111 Primary open-angle glaucoma, right eye, mild stage: Secondary | ICD-10-CM | POA: Diagnosis not present

## 2023-01-09 NOTE — Discharge Instructions (Signed)

## 2023-01-13 ENCOUNTER — Ambulatory Visit: Payer: Medicare Other | Admitting: Anesthesiology

## 2023-01-13 ENCOUNTER — Ambulatory Visit
Admission: RE | Admit: 2023-01-13 | Discharge: 2023-01-13 | Disposition: A | Discharge status: Home / Self Care | Payer: Medicare Other | Attending: Ophthalmology | Admitting: Ophthalmology

## 2023-01-13 ENCOUNTER — Other Ambulatory Visit: Payer: Self-pay

## 2023-01-13 ENCOUNTER — Encounter
Admission: RE | Disposition: A | Discharge status: Home / Self Care | Payer: Self-pay | Source: Home / Self Care | Attending: Ophthalmology

## 2023-01-13 DIAGNOSIS — H2511 Age-related nuclear cataract, right eye: Secondary | ICD-10-CM | POA: Insufficient documentation

## 2023-01-13 DIAGNOSIS — E1136 Type 2 diabetes mellitus with diabetic cataract: Secondary | ICD-10-CM | POA: Diagnosis not present

## 2023-01-13 DIAGNOSIS — E1139 Type 2 diabetes mellitus with other diabetic ophthalmic complication: Secondary | ICD-10-CM | POA: Diagnosis not present

## 2023-01-13 DIAGNOSIS — I10 Essential (primary) hypertension: Secondary | ICD-10-CM | POA: Diagnosis not present

## 2023-01-13 DIAGNOSIS — Z8249 Family history of ischemic heart disease and other diseases of the circulatory system: Secondary | ICD-10-CM | POA: Insufficient documentation

## 2023-01-13 DIAGNOSIS — H401111 Primary open-angle glaucoma, right eye, mild stage: Secondary | ICD-10-CM | POA: Insufficient documentation

## 2023-01-13 HISTORY — PX: CATARACT EXTRACTION W/PHACO: SHX586

## 2023-01-13 SURGERY — PHACOEMULSIFICATION, CATARACT, WITH IOL INSERTION
Anesthesia: Monitor Anesthesia Care | Site: Eye | Laterality: Right

## 2023-01-13 MED ORDER — FENTANYL CITRATE (PF) 100 MCG/2ML IJ SOLN
INTRAMUSCULAR | Status: DC | PRN
  Administered 2023-01-13: 50 ug via INTRAVENOUS

## 2023-01-13 MED ORDER — SIGHTPATH DOSE#1 BSS IO SOLN
INTRAOCULAR | Status: DC | PRN
  Administered 2023-01-13: 72 mL via OPHTHALMIC

## 2023-01-13 MED ORDER — SIGHTPATH DOSE#1 NA HYALUR & NA CHOND-NA HYALUR IO KIT
PACK | INTRAOCULAR | Status: DC | PRN
  Administered 2023-01-13: 1 via OPHTHALMIC

## 2023-01-13 MED ORDER — LACTATED RINGERS IV SOLN: INTRAVENOUS | Status: DC

## 2023-01-13 MED ORDER — LIDOCAINE HCL (PF) 2 % IJ SOLN
INTRAOCULAR | Status: DC | PRN
  Administered 2023-01-13: 1 mL via INTRAOCULAR

## 2023-01-13 MED ORDER — MIDAZOLAM HCL 2 MG/2ML IJ SOLN
INTRAMUSCULAR | Status: DC | PRN
  Administered 2023-01-13: 1 mg via INTRAVENOUS

## 2023-01-13 MED ORDER — MOXIFLOXACIN HCL 0.5 % OP SOLN
OPHTHALMIC | Status: DC | PRN
  Administered 2023-01-13: .2 mL via OPHTHALMIC

## 2023-01-13 MED ORDER — SIGHTPATH DOSE#1 BSS IO SOLN
INTRAOCULAR | Status: DC | PRN
  Administered 2023-01-13: 15 mL

## 2023-01-13 MED ORDER — ARMC OPHTHALMIC DILATING DROPS
1.0000 | OPHTHALMIC | Status: DC | PRN
  Administered 2023-01-13 (×3): 1 via OPHTHALMIC

## 2023-01-13 MED ORDER — TETRACAINE HCL 0.5 % OP SOLN
1.0000 [drp] | OPHTHALMIC | Status: DC | PRN
  Administered 2023-01-13 (×3): 1 [drp] via OPHTHALMIC

## 2023-01-13 SURGICAL SUPPLY — 13 items
CATARACT SUITE SIGHTPATH (MISCELLANEOUS) ×1 IMPLANT
DISSECTOR HYDRO NUCLEUS 50X22 (MISCELLANEOUS) ×1 IMPLANT
FEE CATARACT SUITE SIGHTPATH (MISCELLANEOUS) ×1 IMPLANT
GLOVE SURG GAMMEX PI TX LF 7.5 (GLOVE) ×1 IMPLANT
GLOVE SURG SYN 8.5 E (GLOVE) ×1 IMPLANT
GLOVE SURG SYN 8.5 PF PI (GLOVE) ×1 IMPLANT
LENS CLAREON VIVITY 22.0 ×1 IMPLANT
LENS IOL CLRN VT YLW 22.0 IMPLANT
NDL FILTER BLUNT 18X1 1/2 (NEEDLE) ×1 IMPLANT
NEEDLE FILTER BLUNT 18X1 1/2 (NEEDLE) ×1 IMPLANT
STENT OPTH STRL GLAUCOMA IMPLANT
SYR 3ML LL SCALE MARK (SYRINGE) ×1 IMPLANT
SYR 5ML LL (SYRINGE) ×1 IMPLANT

## 2023-01-13 NOTE — Op Note (Signed)
OPERATIVE NOTE  Bethany Martinez [id number] 01/13/2023  PREOPERATIVE DIAGNOSIS:   1.  Mild PRIMARY open angle glaucoma, right eye. H40.1111  2.  Nuclear sclerotic cataract right eye.  H25.11   POSTOPERATIVE DIAGNOSIS:    same.   PROCEDURE:   1.  Placement of trabecular bypass stent (hydrus) and phacoemusification with posterior chamber intraocular lens placement of the right eye  CPT [phone]   LENS: Implant Name Type Inv. Item Serial No. Manufacturer Lot No. LRB No. Used Action  STENT OPTH STRL GLAUCOMA - [id number]  STENT OPTH STRL GLAUCOMA  [employer] [id number] Right 1 Implanted  LENS CLAREON VIVITY 22.0 - [id number]  LENS CLAREON VIVITY 22.0 [id number] SIGHTPATH  Right 1 Implanted      Procedure(s): CATARACT EXTRACTION PHACO AND INTRAOCULAR LENS PLACEMENT (IOC) RIGHT HYDRUS MICROSTENT  CLAREON VIVITY  LENS  3.28  00:23.8 (Right)  CNWET0 +22.0 vivity non-toric   ULTRASOUND TIME: 0 minutes 23.8 seconds.  CDE 3.28   SURGEON:  Willey Blade, MD, MPH  ANESTHESIOLOGIST: Anesthesiologist: Marisue Humble, MD CRNA: Emeterio Reeve, CRNA   ANESTHESIA:  MAC and intracameral preservative-free lidocaine 4%.  ESTIMATED BLOOD LOSS: less than 1 mL.   COMPLICATIONS:  None.   DESCRIPTION OF PROCEDURE:  The patient was identified in the holding room and transported to the operating room.   The patient was placed in the supine position under the operating microscope.  The right eye was prepped and draped in the usual sterile ophthalmic fashion.   A 1.0 millimeter clear-corneal paracentesis was made at the 10:30 position and a second paracentesis at 7:00.  0.5 ml of preservative-free 1% lidocaine with epinephrine was injected into the anterior chamber.  The anterior chamber was filled with viscoelastic.  A 2.4 millimeter keratome was used to make a near-clear corneal incision at the 8:00 position.   Attention was turned to the hydrus.  The patients head was turned to the left and  the microscope was tilted to 035 degrees.  Ocular instruments/Glaukos OAL/H2 gonioprism was used coupled with viscoelastic on the cornea was used to visualize the trabecular meshwork. The hydrus was opened and introduced into the eye.  The meshwork was engaged with the tip of the injector and the hydrus stent was deployed into Schlemm's canal at 4:00.  The stent was well seated and in good position.  Next, attention was turned to the phacoemulsification A curvilinear capsulorrhexis was made with a cystotome and capsulorrhexis forceps.  Balanced salt solution was used to hydrodissect and hydrodelineate the nucleus.   Phacoemulsification was then used in stop and chop fashion to remove the lens nucleus and epinucleus.  The remaining cortex was then removed using the irrigation and aspiration handpiece. Viscoelastic was then placed into the capsular bag to distend it for lens placement.  A lens was then injected into the capsular bag.  The remaining viscoelastic was aspirated.   Wounds were hydrated with balanced salt solution.  The anterior chamber was inflated to a physiologic pressure with balanced salt solution.   Intracameral vigamox 0.1 mL undiluted was injected into the eye and a drop placed onto the ocular surface.  No wound leaks were noted. The patient was taken to the recovery room in stable condition without complications of anesthesia or surgery   Willey Blade 01/13/2023, 9:03 AM

## 2023-01-13 NOTE — Transfer of Care (Signed)
Immediate Anesthesia Transfer of Care Note  Patient: Bethany Martinez  Procedure(s) Performed: CATARACT EXTRACTION PHACO AND INTRAOCULAR LENS PLACEMENT (IOC) RIGHT HYDRUS MICROSTENT  CLAREON VIVITY  LENS  3.28  00:23.8 (Right: Eye)  Patient Location: PACU  Anesthesia Type: MAC  Level of Consciousness: awake, alert  and patient cooperative  Airway and Oxygen Therapy: Patient Spontanous Breathing and Patient connected to supplemental oxygen  Post-op Assessment: Post-op Vital signs reviewed, Patient's Cardiovascular Status Stable, Respiratory Function Stable, Patent Airway and No signs of Nausea or vomiting  Post-op Vital Signs: Reviewed and stable  Complications: No notable events documented.

## 2023-01-13 NOTE — H&P (Signed)
[facility]   Primary Care Physician:  Reubin Milan, MD Ophthalmologist: Dr. Willey Blade  Pre-Procedure History & Physical: HPI:  Bethany Martinez is a 77 y.o. female here for cataract surgery.   Past Medical History:  Diagnosis Date   Allergy    Diabetes mellitus without complication (HCC)    "borderline"   Encounter for dental exam and cleaning w/o abnormal findings 05/18/2018   Glaucoma    Hyperlipidemia    Hypertension     Past Surgical History:  Procedure Laterality Date   ABDOMINAL HYSTERECTOMY  1988   partial   CATARACT EXTRACTION W/PHACO Left 12/09/2022   Procedure: CATARACT EXTRACTION PHACO AND INTRAOCULAR LENS PLACEMENT (IOC) LEFT HYDRUS MICROSTENT  CLAREON VIVITY TORIC LENS  3.89  00:31.1;  Surgeon: Nevada Crane, MD;  Location: [facility];  Service: Ophthalmology;  Laterality: Left;   COLONOSCOPY  01/2009   Dr. Evette Cristal   COLONOSCOPY WITH PROPOFOL N/A 07/12/2021   Procedure: COLONOSCOPY WITH BIOPSY;  Surgeon: Toney Reil, MD;  Location: [facility];  Service: Endoscopy;  Laterality: N/A;   POLYPECTOMY N/A 07/12/2021   Procedure: POLYPECTOMY;  Surgeon: Toney Reil, MD;  Location: [facility];  Service: Endoscopy;  Laterality: N/A;    Prior to Admission medications   Medication Sig Start Date End Date Taking? Authorizing Provider  aspirin EC 81 MG tablet Take 81 mg by mouth daily. Patient taking 3 times per week   Yes [provider]  Calcium Carb-Cholecalciferol (SUPER CALCIUM 600 + D 400 PO) Take 1 tablet by mouth 2 (two) times daily.   Yes [provider]  Garlic 1000 MG CAPS Take by mouth daily.   Yes [provider]  hydrochlorothiazide (HYDRODIURIL) 25 MG tablet TAKE 1 TABLET (25 MG TOTAL) BY MOUTH DAILY. 11/03/22  Yes Reubin Milan, MD  latanoprost (XALATAN) 0.005 % ophthalmic solution SMARTSIG:In Eye(s) 06/27/22  Yes [provider]  losartan (COZAAR) 100 MG  tablet TAKE 1 TABLET BY MOUTH EVERY DAY 07/25/22  Yes Reubin Milan, MD  Omega-3 Fatty Acids (OMEGA 3 500 PO) Take by mouth daily.   Yes [provider]  triamcinolone ointment (KENALOG) 0.1 % Apply 1 application topically 2 (two) times daily. To knees and elbows 04/06/21  Yes Reubin Milan, MD  TURMERIC CURCUMIN PO Take by mouth.   Yes [provider]  rosuvastatin (CRESTOR) 5 MG tablet TAKE 1 TABLET BY MOUTH THREE TIMES A WEEK. Patient not taking: Reported on 12/03/2022 07/19/22   Reubin Milan, MD    Allergies as of 11/22/2022 - Review Complete 11/22/2022  Allergen Reaction Noted   Atorvastatin Other (See Comments) 11/21/2020    Family History  Problem Relation Age of Onset   Hypertension Mother    Hypertension Father    Breast cancer Sister 76   Cancer Sister        colon   Dementia Sister    Breast cancer Paternal Aunt 87   Heart disease Brother        CABG    Social History   Socioeconomic History   Marital status: Divorced    Spouse name: Not on file   Number of children: 2   Years of education: some college   Highest education level: Bachelor's degree (e.g., BA, AB, BS)  Occupational History   Occupation: Retired  Tobacco Use   Smoking status: Never   Smokeless tobacco: Never   Tobacco comments:    smoking cessation materials  not required  Vaping Use   Vaping Use: Never used  Substance and Sexual Activity   Alcohol use: No    Alcohol/week: 0.0 standard drinks of alcohol   Drug use: No   Sexual activity: Not Currently  Other Topics Concern   Not on file  Social History Narrative   Pt lives alone. Sister with dementia passed away February 27, 2020.    Social Determinants of Health   Financial Resource Strain: Low Risk  (11/19/2022)   Overall Financial Resource Strain (CARDIA)    Difficulty of Paying Living Expenses: Not hard at all  Food Insecurity: No Food Insecurity (11/19/2022)   Hunger Vital Sign    Worried About Running Out of  Food in the Last Year: Never true    Ran Out of Food in the Last Year: Never true  Transportation Needs: No Transportation Needs (11/19/2022)   PRAPARE - Administrator, Civil Service (Medical): No    Lack of Transportation (Non-Medical): No  Physical Activity: Insufficiently Active (11/19/2022)   Exercise Vital Sign    Days of Exercise per Week: 3 days    Minutes of Exercise per Session: 30 min  Stress: No Stress Concern Present (11/19/2022)   [employer] of Occupational Health - Occupational Stress Questionnaire    Feeling of Stress : Not at all  Social Connections: Moderately Integrated (11/19/2022)   Social Connection and Isolation Panel [NHANES]    Frequency of Communication with Friends and Family: More than three times a week    Frequency of Social Gatherings with Friends and Family: More than three times a week    Attends Religious Services: More than 4 times per year    Active Member of [employer] or Organizations: Yes    Attends Engineer, structural: More than 4 times per year    Marital Status: Divorced  Intimate Partner Violence: Not At Risk (07/05/2022)   Humiliation, Afraid, Rape, and Kick questionnaire    Fear of Current or Ex-Partner: No    Emotionally Abused: No    Physically Abused: No    Sexually Abused: No    Review of Systems: See HPI, otherwise negative ROS  Physical Exam: BP 136/78   Pulse 75   Temp (!) 97.4 F (36.3 C)   Resp 15   Ht 5\' 1"  (1.549 m)   Wt 69.9 kg   SpO2 98%   BMI 29.10 kg/m  General:   Alert, cooperative in NAD Head:  Normocephalic and atraumatic. Respiratory:  Normal work of breathing. Cardiovascular:  RRR  Impression/Plan: Bethany Martinez is here for cataract surgery.  Risks, benefits, limitations, and alternatives regarding cataract surgery have been reviewed with the patient.  Questions have been answered.  All parties agreeable.   Willey Blade, MD  01/13/2023, 8:31 AM

## 2023-01-13 NOTE — Anesthesia Postprocedure Evaluation (Signed)
Anesthesia Post Note  Patient: Phynix Wachow[place]ak  Procedure(s) Performed: CATARACT EXTRACTION PHACO AND INTRAOCULAR LENS PLACEMENT (IOC) RIGHT HYDRUS MICROSTENT  CLAREON VIVITY  LENS  3.28  00:23.8 (Right: Eye)  Patient location during evaluation: PACU Anesthesia Type: MAC Level of consciousness: awake and alert Pain management: pain level controlled Vital Signs Assessment: post-procedure vital signs reviewed and stable Respiratory status: spontaneous breathing, nonlabored ventilation, respiratory function stable and patient connected to nasal cannula oxygen Cardiovascular status: stable and blood pressure returned to baseline Postop Assessment: no apparent nausea or vomiting Anesthetic complications: no   No notable events documented.   Last Vitals:  Vitals:   01/13/23 0905 01/13/23 0910  BP: (!) 97/50 (!) 111/49  Pulse: 61 63  Resp: 10 16  Temp: 36.5 C 36.5 C  SpO2: 100% 100%    Last Pain:  Vitals:   01/13/23 0910  PainSc: 0-No pain                 Mahoganie Basher C Tiarna Koppen

## 2023-01-13 NOTE — Anesthesia Preprocedure Evaluation (Addendum)
Anesthesia Evaluation  Patient identified by MRN, date of birth, ID band Patient awake    Reviewed: Allergy & Precautions, H&P , NPO status , Patient's Chart, lab work & pertinent test results  Airway Mallampati: III  TM Distance: >3 FB Neck ROM: Full    Dental no notable dental hx.    Pulmonary neg pulmonary ROS   Pulmonary exam normal breath sounds clear to auscultation       Cardiovascular hypertension, Normal cardiovascular exam Rhythm:Regular Rate:Normal     Neuro/Psych negative neurological ROS  negative psych ROS   GI/Hepatic negative GI ROS, Neg liver ROS,,,  Endo/Other  diabetes    Renal/GU negative Renal ROS  negative genitourinary   Musculoskeletal negative musculoskeletal ROS (+)    Abdominal   Peds negative pediatric ROS (+)  Hematology negative hematology ROS (+)   Anesthesia Other Findings Glaucoma  Allergy Diabetes mellitus without complicationHypertension Hyperlipidemia   Recent cataract surgery 12-09-22   Reproductive/Obstetrics negative OB ROS                             Anesthesia Physical Anesthesia Plan  ASA: 2  Anesthesia Plan: MAC   Post-op Pain Management:    Induction: Intravenous  PONV Risk Score and Plan:   Airway Management Planned: Natural Airway and Nasal Cannula  Additional Equipment:   Intra-op Plan:   Post-operative Plan:   Informed Consent: I have reviewed the patients History and Physical, chart, labs and discussed the procedure including the risks, benefits and alternatives for the proposed anesthesia with the patient or authorized representative who has indicated his/her understanding and acceptance.     Dental Advisory Given  Plan Discussed with: Anesthesiologist, CRNA and Surgeon  Anesthesia Plan Comments: (Patient consented for risks of anesthesia including but not limited to:  - adverse reactions to medications - damage  to eyes, teeth, lips or other oral mucosa - nerve damage due to positioning  - sore throat or hoarseness - Damage to heart, brain, nerves, lungs, other parts of body or loss of life  Patient voiced understanding.)        Anesthesia Quick Evaluation

## 2023-01-14 ENCOUNTER — Encounter: Payer: Self-pay | Admitting: Ophthalmology

## 2023-02-02 ENCOUNTER — Other Ambulatory Visit: Payer: Self-pay | Admitting: Internal Medicine

## 2023-02-02 DIAGNOSIS — I10 Essential (primary) hypertension: Secondary | ICD-10-CM

## 2023-02-07 ENCOUNTER — Other Ambulatory Visit: Payer: Self-pay | Admitting: Internal Medicine

## 2023-02-07 DIAGNOSIS — E782 Mixed hyperlipidemia: Secondary | ICD-10-CM

## 2023-02-07 NOTE — Telephone Encounter (Signed)
Requested Prescriptions  Pending Prescriptions Disp Refills   rosuvastatin (CRESTOR) 5 MG tablet [Pharmacy Med Name: ROSUVASTATIN CALCIUM 5 MG TAB] 36 tablet 0    Sig: TAKE 1 TABLET BY MOUTH THREE TIMES A WEEK.     Cardiovascular:  Antilipid - Statins 2 Failed - 02/07/2023  2:24 AM      Failed - Lipid Panel in normal range within the last 12 months    Cholesterol, Total  Date Value Ref Range Status  05/23/2022 212 (H) 100 - 199 mg/dL Final   LDL Chol Calc (NIH)  Date Value Ref Range Status  05/23/2022 134 (H) 0 - 99 mg/dL Final   HDL  Date Value Ref Range Status  05/23/2022 57 >39 mg/dL Final   Triglycerides  Date Value Ref Range Status  05/23/2022 119 0 - 149 mg/dL Final         Passed - Cr in normal range and within 360 days    Creatinine, Ser  Date Value Ref Range Status  05/23/2022 0.93 0.57 - 1.00 mg/dL Final         Passed - Patient is not pregnant      Passed - Valid encounter within last 12 months    Recent Outpatient Visits           2 months ago Essential (primary) hypertension   [place] [facility] at [facility], Nyoka Cowden, MD   7 months ago Essential (primary) hypertension   [place] [facility] at [facility], Nyoka Cowden, MD   8 months ago Annual physical exam   [facility] at [facility], Nyoka Cowden, MD   1 year ago Essential (primary) hypertension   [place] [facility] at [facility], Nyoka Cowden, MD   1 year ago COVID-19 virus infection   [facility] at [facility], Nyoka Cowden, MD       Future Appointments             In 3 months Judithann Graves, Nyoka Cowden, MD [facility] Medicine at [facility], [facility]

## 2023-04-05 ENCOUNTER — Other Ambulatory Visit: Payer: Self-pay | Admitting: Internal Medicine

## 2023-04-05 DIAGNOSIS — I10 Essential (primary) hypertension: Secondary | ICD-10-CM

## 2023-04-25 ENCOUNTER — Other Ambulatory Visit: Payer: Self-pay | Admitting: Internal Medicine

## 2023-04-25 DIAGNOSIS — E782 Mixed hyperlipidemia: Secondary | ICD-10-CM

## 2023-04-25 NOTE — Telephone Encounter (Signed)
Requested Prescriptions  Pending Prescriptions Disp Refills   rosuvastatin (CRESTOR) 5 MG tablet [Pharmacy Med Name: ROSUVASTATIN CALCIUM 5 MG TAB] 36 tablet 0    Sig: TAKE 1 TABLET BY MOUTH THREE TIMES A WEEK.     Cardiovascular:  Antilipid - Statins 2 Failed - 04/25/2023  1:31 AM      Failed - Lipid Panel in normal range within the last 12 months    Cholesterol, Total  Date Value Ref Range Status  05/23/2022 212 (H) 100 - 199 mg/dL Final   LDL Chol Calc (NIH)  Date Value Ref Range Status  05/23/2022 134 (H) 0 - 99 mg/dL Final   HDL  Date Value Ref Range Status  05/23/2022 57 >39 mg/dL Final   Triglycerides  Date Value Ref Range Status  05/23/2022 119 0 - 149 mg/dL Final         Passed - Cr in normal range and within 360 days    Creatinine, Ser  Date Value Ref Range Status  05/23/2022 0.93 0.57 - 1.00 mg/dL Final         Passed - Patient is not pregnant      Passed - Valid encounter within last 12 months    Recent Outpatient Visits           5 months ago Essential (primary) hypertension   [place] [facility] at [facility], Nyoka Cowden, MD   9 months ago Essential (primary) hypertension   [place] [facility] at [facility], Nyoka Cowden, MD   11 months ago Annual physical exam   [facility] at [facility], Nyoka Cowden, MD   1 year ago Essential (primary) hypertension   [place] [facility] at [facility], Nyoka Cowden, MD   1 year ago COVID-19 virus infection   [facility] Medicine at [facility], Nyoka Cowden, MD       Future Appointments             In 1 month Judithann Graves, Nyoka Cowden, MD [facility] Primary Care & Sports Medicine at [facility], [facility]

## 2023-05-26 NOTE — Progress Notes (Unsigned)
Date:  05/27/2023   Name:  Bethany Martinez   DOB:  05-12-1946   MRN:  [id number]   Chief Complaint: No chief complaint on file. Bethany Martinez is a 77 y.o. female who presents today for her Complete Annual Exam. She feels {DESC; WELL/FAIRLY WELL/POORLY:18703}. She reports exercising ***. She reports she is sleeping {DESC; WELL/FAIRLY WELL/POORLY:18703}. Breast complaints ***.  Mammogram: 06/2022 DEXA: osteopenia Colonoscopy: 06/2021 repeat 5 yrs  Health Maintenance Due  Topic Date Due   INFLUENZA VACCINE  02/27/2023   COVID-19 Vaccine (5 - 2023-24 season) 03/30/2023   Medicare Annual Wellness (AWV)  07/06/2023    Immunization History  Administered Date(s) Administered   Fluad Quad(high Dose 65+) 05/10/2019, 05/10/2020, 04/06/2021, 05/23/2022   Influenza, High Dose Seasonal PF 05/23/2017, 04/17/2018   Influenza-Unspecified 05/11/2015, 05/23/2017, 06/22/2019   Moderna Sars-Covid-2 Vaccination 09/20/2019, 10/18/2019, 06/27/2020   Pfizer(Comirnaty)Fall Seasonal Vaccine 12 years and older 07/10/2022   Pneumococcal Conjugate-13 06/07/2015   Pneumococcal Polysaccharide-23 12/01/2012   Tdap 11/27/2009, 04/30/2022   Zoster Recombinant(Shingrix) 06/22/2019, 01/27/2020   Zoster, Live 01/11/2011     Hypertension This is a chronic problem. The problem is controlled. Past treatments include angiotensin blockers and diuretics.  Hyperlipidemia This is a chronic problem. Current antihyperlipidemic treatment includes statins.    Review of Systems   Lab Results  Component Value Date   NA 140 05/23/2022   K 3.6 05/23/2022   CO2 24 05/23/2022   GLUCOSE 90 05/23/2022   BUN 12 05/23/2022   CREATININE 0.93 05/23/2022   CALCIUM 9.9 05/23/2022   EGFR 64 05/23/2022   GFRNONAA >60 03/26/2022   Lab Results  Component Value Date   CHOL 212 (H) 05/23/2022   HDL 57 05/23/2022   LDLCALC 134 (H) 05/23/2022   TRIG 119 05/23/2022   CHOLHDL 3.7 05/23/2022   Lab Results   Component Value Date   TSH 1.810 05/23/2022   Lab Results  Component Value Date   HGBA1C 6.1 (H) 05/23/2022   Lab Results  Component Value Date   WBC 6.3 05/23/2022   HGB 13.1 05/23/2022   HCT 38.8 05/23/2022   MCV 85 05/23/2022   PLT 329 05/23/2022   Lab Results  Component Value Date   ALT 15 05/23/2022   AST 14 05/23/2022   ALKPHOS 87 05/23/2022   BILITOT 0.9 05/23/2022   Lab Results  Component Value Date   VD25OH 23.0 (L) 05/18/2020     Patient Active Problem List   Diagnosis Date Noted   Colon cancer screening    Polyp of descending colon    Age-related osteoporosis without fracture 05/18/2020   Mixed hyperlipidemia 05/18/2019   Eczema 07/18/2017   Prediabetes 06/07/2015   Edema of extremities 01/11/2015   Allergy 01/11/2015   Essential (primary) hypertension 01/11/2015    Allergies  Allergen Reactions   Atorvastatin Other (See Comments)    myalgia    Past Surgical History:  Procedure Laterality Date   ABDOMINAL HYSTERECTOMY  1988   partial   CATARACT EXTRACTION W/PHACO Left 12/09/2022   Procedure: CATARACT EXTRACTION PHACO AND INTRAOCULAR LENS PLACEMENT (IOC) LEFT HYDRUS MICROSTENT  CLAREON VIVITY TORIC LENS  3.89  00:31.1;  Surgeon: Nevada Crane, MD;  Location: [facility];  Service: Ophthalmology;  Laterality: Left;   CATARACT EXTRACTION W/PHACO Right 01/13/2023   Procedure: CATARACT EXTRACTION PHACO AND INTRAOCULAR LENS PLACEMENT (IOC) RIGHT HYDRUS MICROSTENT  CLAREON VIVITY  LENS  3.28  00:23.8;  Surgeon: Nevada Crane, MD;  Location: [facility];  Service: Ophthalmology;  Laterality: Right;   COLONOSCOPY  01/2009   Dr. Evette Cristal   COLONOSCOPY WITH PROPOFOL N/A 07/12/2021   Procedure: COLONOSCOPY WITH BIOPSY;  Surgeon: Toney Reil, MD;  Location: [facility];  Service: Endoscopy;  Laterality: N/A;   POLYPECTOMY N/A 07/12/2021   Procedure: POLYPECTOMY;  Surgeon: Toney Reil, MD;  Location: [facility];  Service: Endoscopy;  Laterality: N/A;    Social History   Tobacco Use   Smoking status: Never   Smokeless tobacco: Never   Tobacco comments:    smoking cessation materials not required  Vaping Use   Vaping status: Never Used  Substance Use Topics   Alcohol use: No    Alcohol/week: 0.0 standard drinks of alcohol   Drug use: No     Medication list has been reviewed and updated.  No outpatient medications have been marked as taking for the 05/27/23 encounter (Appointment) with Reubin Milan, MD.       11/22/2022    9:33 AM 07/10/2022    1:50 PM 05/23/2022    9:45 AM 11/20/2021    9:40 AM  GAD 7 : Generalized Anxiety Score  Nervous, Anxious, on Edge 0 0 0 0  Control/stop worrying 0 0 0 0  Worry too much - different things 0 0 0 0  Trouble relaxing 0 0 0 0  Restless 0 0 0 0  Easily annoyed or irritable 0 0 0 0  Afraid - awful might happen 0 0 0 0  Total GAD 7 Score 0 0 0 0  Anxiety Difficulty Not difficult at all Not difficult at all Not difficult at all        11/22/2022    9:33 AM 07/10/2022    1:50 PM 07/05/2022   10:09 AM  Depression screen PHQ 2/9  Decreased Interest 0 0 0  Down, Depressed, Hopeless 0 0 0  PHQ - 2 Score 0 0 0  Altered sleeping 0 0 0  Tired, decreased energy 0 0 0  Change in appetite 0 0 0  Feeling bad or failure about yourself  0 0 0  Trouble concentrating 0 0 0  Moving slowly or fidgety/restless 0 0 0  Suicidal thoughts 0 0 0  PHQ-9 Score 0 0 0  Difficult doing work/chores Not difficult at all Not difficult at all Not difficult at all    BP Readings from Last 3 Encounters:  01/13/23 (!) 111/49  12/09/22 (!) 133/58  11/22/22 125/70    Physical Exam  Wt Readings from Last 3 Encounters:  01/13/23 154 lb (69.9 kg)  12/09/22 150 lb (68 kg)  11/22/22 155 lb (70.3 kg)    There were no vitals taken for this visit.  Assessment and Plan:  Problem List Items Addressed This Visit       Unprioritized   Essential (primary)  hypertension (Chronic)   Mixed hyperlipidemia (Chronic)   Prediabetes (Chronic)   Other Visit Diagnoses     Annual physical exam    -  Primary   Encounter for screening mammogram for breast cancer       Vitamin D deficiency           No follow-ups on file.    Reubin Milan, MD [facility] [place]

## 2023-05-27 ENCOUNTER — Ambulatory Visit (INDEPENDENT_AMBULATORY_CARE_PROVIDER_SITE_OTHER): Payer: Medicare Other | Admitting: Internal Medicine

## 2023-05-27 ENCOUNTER — Encounter: Payer: Self-pay | Admitting: Internal Medicine

## 2023-05-27 VITALS — BP 132/74 | HR 70 | Ht 61.0 in | Wt 153.0 lb

## 2023-05-27 DIAGNOSIS — I10 Essential (primary) hypertension: Secondary | ICD-10-CM | POA: Diagnosis not present

## 2023-05-27 DIAGNOSIS — R7303 Prediabetes: Secondary | ICD-10-CM | POA: Diagnosis not present

## 2023-05-27 DIAGNOSIS — Z0000 Encounter for general adult medical examination without abnormal findings: Secondary | ICD-10-CM | POA: Diagnosis not present

## 2023-05-27 DIAGNOSIS — E559 Vitamin D deficiency, unspecified: Secondary | ICD-10-CM

## 2023-05-27 DIAGNOSIS — Z23 Encounter for immunization: Secondary | ICD-10-CM | POA: Diagnosis not present

## 2023-05-27 DIAGNOSIS — Z1231 Encounter for screening mammogram for malignant neoplasm of breast: Secondary | ICD-10-CM | POA: Diagnosis not present

## 2023-05-27 DIAGNOSIS — E782 Mixed hyperlipidemia: Secondary | ICD-10-CM | POA: Diagnosis not present

## 2023-05-27 NOTE — Assessment & Plan Note (Signed)
Treated with diet and exercise.

## 2023-05-27 NOTE — Assessment & Plan Note (Addendum)
LDL is  Lab Results  Component Value Date   LDLCALC 134 (H) 05/23/2022  Currently being treated with Crestor with good compliance and no concerns.

## 2023-05-27 NOTE — Patient Instructions (Signed)
Call [facility] to schedule your mammogram at [phone].  

## 2023-05-27 NOTE — Assessment & Plan Note (Addendum)
Normal exam with stable BP on hydrochlorothiazide and losartan. No concerns or side effects to current medication. No change in regimen; continue low sodium diet.  Recommend more regular exercise.

## 2023-05-28 LAB — COMPREHENSIVE METABOLIC PANEL
ALT: 22 [IU]/L (ref 0–32)
AST: 18 [IU]/L (ref 0–40)
Albumin: 4.5 g/dL (ref 3.8–4.8)
Alkaline Phosphatase: 87 [IU]/L (ref 44–121)
BUN/Creatinine Ratio: 18 (ref 12–28)
BUN: 16 mg/dL (ref 8–27)
Bilirubin Total: 0.7 mg/dL (ref 0.0–1.2)
CO2: 25 mmol/L (ref 20–29)
Calcium: 10 mg/dL (ref 8.7–10.3)
Chloride: 98 mmol/L (ref 96–106)
Creatinine, Ser: 0.91 mg/dL (ref 0.57–1.00)
Globulin, Total: 3 g/dL (ref 1.5–4.5)
Glucose: 98 mg/dL (ref 70–99)
Potassium: 3.7 mmol/L (ref 3.5–5.2)
Sodium: 139 mmol/L (ref 134–144)
Total Protein: 7.5 g/dL (ref 6.0–8.5)
eGFR: 65 mL/min/{1.73_m2} (ref >59)

## 2023-05-28 LAB — CBC WITH DIFFERENTIAL/PLATELET
Basophils Absolute: 0 10*3/uL (ref 0.0–0.2)
Basos: 0 %
EOS (ABSOLUTE): 0.1 10*3/uL (ref 0.0–0.4)
Eos: 1 %
Hematocrit: 38.5 % (ref 34.0–46.6)
Hemoglobin: 12.5 g/dL (ref 11.1–15.9)
Immature Grans (Abs): 0 10*3/uL (ref 0.0–0.1)
Immature Granulocytes: 1 %
Lymphocytes Absolute: 1.9 10*3/uL (ref 0.7–3.1)
Lymphs: 30 %
MCH: 28.4 pg (ref 26.6–33.0)
MCHC: 32.5 g/dL (ref 31.5–35.7)
MCV: 88 fL (ref 79–97)
Monocytes Absolute: 0.6 10*3/uL (ref 0.1–0.9)
Monocytes: 9 %
Neutrophils Absolute: 3.9 10*3/uL (ref 1.4–7.0)
Neutrophils: 59 %
Platelets: 299 10*3/uL (ref 150–450)
RBC: 4.4 x10E6/uL (ref 3.77–5.28)
RDW: 14.9 % (ref 11.7–15.4)
WBC: 6.6 10*3/uL (ref 3.4–10.8)

## 2023-05-28 LAB — TSH: TSH: 1.45 u[IU]/mL (ref 0.450–4.500)

## 2023-05-28 LAB — VITAMIN D 25 HYDROXY (VIT D DEFICIENCY, FRACTURES): Vit D, 25-Hydroxy: 36.4 ng/mL (ref 30.0–100.0)

## 2023-05-28 LAB — MICROSCOPIC EXAMINATION
Bacteria, UA: NONE SEEN
Casts: NONE SEEN /[LPF]
RBC, Urine: NONE SEEN /[HPF] (ref 0–2)
WBC, UA: NONE SEEN /[HPF] (ref 0–5)

## 2023-05-28 LAB — URINALYSIS, ROUTINE W REFLEX MICROSCOPIC
Bilirubin, UA: NEGATIVE
Glucose, UA: NEGATIVE
Ketones, UA: NEGATIVE
Nitrite, UA: NEGATIVE
Protein,UA: NEGATIVE
RBC, UA: NEGATIVE
Specific Gravity, UA: 1.012 (ref 1.005–1.030)
Urobilinogen, Ur: 0.2 mg/dL (ref 0.2–1.0)
pH, UA: 8 — ABNORMAL HIGH (ref 5.0–7.5)

## 2023-05-28 LAB — LIPID PANEL
Chol/HDL Ratio: 3.9 ratio (ref 0.0–4.4)
Cholesterol, Total: 218 mg/dL — ABNORMAL HIGH (ref 100–199)
HDL: 56 mg/dL (ref >39)
LDL Chol Calc (NIH): 140 mg/dL — ABNORMAL HIGH (ref 0–99)
Triglycerides: 125 mg/dL (ref 0–149)
VLDL Cholesterol Cal: 22 mg/dL (ref 5–40)

## 2023-05-28 LAB — HEMOGLOBIN A1C
Est. average glucose Bld gHb Est-mCnc: 134 mg/dL
Hgb A1c MFr Bld: 6.3 % — ABNORMAL HIGH (ref 4.8–5.6)

## 2023-07-01 ENCOUNTER — Telehealth: Payer: Self-pay | Admitting: Internal Medicine

## 2023-07-01 ENCOUNTER — Other Ambulatory Visit: Payer: Self-pay

## 2023-07-01 DIAGNOSIS — I10 Essential (primary) hypertension: Secondary | ICD-10-CM

## 2023-07-01 DIAGNOSIS — E782 Mixed hyperlipidemia: Secondary | ICD-10-CM

## 2023-07-01 MED ORDER — ROSUVASTATIN CALCIUM 5 MG PO TABS
ORAL_TABLET | ORAL | 1 refills | Status: DC
Start: 2023-07-01 — End: 2023-07-18

## 2023-07-01 MED ORDER — HYDROCHLOROTHIAZIDE 25 MG PO TABS
25.0000 mg | ORAL_TABLET | Freq: Every day | ORAL | 1 refills | Status: DC
Start: 2023-07-01 — End: 2023-09-10

## 2023-07-01 MED ORDER — LOSARTAN POTASSIUM 100 MG PO TABS
100.0000 mg | ORAL_TABLET | Freq: Every day | ORAL | 1 refills | Status: DC
Start: 2023-07-01 — End: 2023-09-02

## 2023-07-01 NOTE — Telephone Encounter (Signed)
Copied from CRM [id number]. Topic: General - Other >> Jul 01, 2023 10:22 AM Macon Large wrote: Reason for CRM: Pt stated that [facility] informed her that they have not heard back from Dr. Judithann Graves regarding order# [id number]. Cb# [phone]

## 2023-07-01 NOTE — Telephone Encounter (Signed)
SPOKE WITH PATIENT ABOUT REFILLS AND LET HER KNOW THEY WOULD BE SENT IN. Bethany Martinez

## 2023-07-09 ENCOUNTER — Ambulatory Visit: Payer: Medicare Other

## 2023-07-09 VITALS — BP 146/80 | Ht 61.0 in | Wt 152.0 lb

## 2023-07-09 DIAGNOSIS — Z0000 Encounter for general adult medical examination without abnormal findings: Secondary | ICD-10-CM

## 2023-07-09 NOTE — Progress Notes (Signed)
Subjective:   Bethany Martinez is a 77 y.o. female who presents for Medicare Annual (Subsequent) preventive examination.  Visit Complete: In person  Cardiac Risk Factors include: advanced age (>74men, >26 women);dyslipidemia;hypertension     Objective:    Today's Vitals   07/09/23 1019 07/09/23 1021  BP: (!) 146/80   Weight: 152 lb (68.9 kg)   Height: 5\' 1"  (1.549 m)   PainSc:  0-No pain   Body mass index is 28.72 kg/m.     07/09/2023   10:25 AM 01/13/2023    7:38 AM 12/09/2022    8:33 AM 03/05/2022   12:25 PM 07/12/2021    7:16 AM 06/20/2021   11:23 AM 05/10/2020   10:58 AM  Advanced Directives  Does Patient Have a Medical Advance Directive? No Yes No;Yes Yes Yes Yes Yes  Type of Special educational needs teacher of [place];Living will Healthcare Power of [place];Living will Healthcare Power of [place];Living will Healthcare Power of [place];Living will Healthcare Power of [place];Living will Healthcare Power of [place];Living will  Does patient want to make changes to medical advance directive?  No - Patient declined   No - Patient declined    Copy of Healthcare Power of Attorney in Chart?  No - copy requested No - copy requested  No - copy requested No - copy requested No - copy requested  Would patient like information on creating a medical advance directive? No - Patient declined          Current Medications (verified) Outpatient Encounter Medications as of 07/09/2023  Medication Sig   aspirin EC 81 MG tablet Take 81 mg by mouth daily. Patient taking 3 times per week   Calcium Carb-Cholecalciferol (SUPER CALCIUM 600 + D 400 PO) Take 1 tablet by mouth 2 (two) times daily.   Garlic 1000 MG CAPS Take by mouth daily.   hydrochlorothiazide (HYDRODIURIL) 25 MG tablet Take 1 tablet (25 mg total) by mouth daily.   latanoprost (XALATAN) 0.005 % ophthalmic solution SMARTSIG:In Eye(s)   losartan (COZAAR) 100 MG tablet Take 1 tablet (100 mg total) by mouth daily.    Omega-3 Fatty Acids (OMEGA 3 500 PO) Take by mouth daily.   rosuvastatin (CRESTOR) 5 MG tablet TAKE 1 TABLET BY MOUTH THREE TIMES A WEEK   triamcinolone ointment (KENALOG) 0.1 % Apply 1 application topically 2 (two) times daily. To knees and elbows   TURMERIC CURCUMIN PO Take by mouth.   No facility-administered encounter medications on file as of 07/09/2023.    Allergies (verified) Atorvastatin   History: Past Medical History:  Diagnosis Date   Allergy    Diabetes mellitus without complication (HCC)    "borderline"   Encounter for dental exam and cleaning w/o abnormal findings 05/18/2018   Glaucoma    Hyperlipidemia    Hypertension    Past Surgical History:  Procedure Laterality Date   ABDOMINAL HYSTERECTOMY  1988   partial   CATARACT EXTRACTION W/PHACO Left 12/09/2022   Procedure: CATARACT EXTRACTION PHACO AND INTRAOCULAR LENS PLACEMENT (IOC) LEFT HYDRUS MICROSTENT  CLAREON VIVITY TORIC LENS  3.89  00:31.1;  Surgeon: Nevada Crane, MD;  Location[facility];  Service: Ophthalmology;  Laterality: Left;   CATARACT EXTRACTION W/PHACO Right 01/13/2023   Procedure: CATARACT EXTRACTION PHACO AND INTRAOCULAR LENS PLACEMENT (IOC) RIGHT HYDRUS MICROSTENT  CLAREON VIVITY  LENS  3.28  00:23.8;  Surgeon: Nevada Crane, MD;  Location: [facility];  Service: Ophthalmology;  Laterality: Right;   COLONOSCOPY  01/2009  Dr. Evette Cristal   COLONOSCOPY WITH PROPOFOL N/A 07/12/2021   Procedure: COLONOSCOPY WITH BIOPSY;  Surgeon: Toney Reil, MD;  Location: [facility];  Service: Endoscopy;  Laterality: N/A;   POLYPECTOMY N/A 07/12/2021   Procedure: POLYPECTOMY;  Surgeon: Toney Reil, MD;  Location: [facility];  Service: Endoscopy;  Laterality: N/A;   Family History  Problem Relation Age of Onset   Hypertension Mother    Hypertension Father    Breast cancer Sister 57   Cancer Sister        colon   Dementia Sister    Breast cancer Paternal  Aunt 20   Heart disease Brother        CABG   Social History   Socioeconomic History   Marital status: Divorced    Spouse name: Not on file   Number of children: 2   Years of education: some college   Highest education level: Bachelor's degree (e.g., BA, AB, BS)  Occupational History   Occupation: Retired  Tobacco Use   Smoking status: Never   Smokeless tobacco: Never   Tobacco comments:    smoking cessation materials not required  Vaping Use   Vaping status: Never Used  Substance and Sexual Activity   Alcohol use: No    Alcohol/week: 0.0 standard drinks of alcohol   Drug use: No   Sexual activity: Not Currently  Other Topics Concern   Not on file  Social History Narrative   Pt lives alone. Sister with dementia passed away 12-Feb-2020.    Social Determinants of Health   Financial Resource Strain: Low Risk  (07/09/2023)   Overall Financial Resource Strain (CARDIA)    Difficulty of Paying Living Expenses: Not hard at all  Food Insecurity: No Food Insecurity (07/09/2023)   Hunger Vital Sign    Worried About Running Out of Food in the Last Year: Never true    Ran Out of Food in the Last Year: Never true  Transportation Needs: No Transportation Needs (07/09/2023)   PRAPARE - Administrator, Civil Service (Medical): No    Lack of Transportation (Non-Medical): No  Physical Activity: Insufficiently Active (07/09/2023)   Exercise Vital Sign    Days of Exercise per Week: 7 days    Minutes of Exercise per Session: 20 min  Stress: No Stress Concern Present (07/09/2023)   [employer] of Occupational Health - Occupational Stress Questionnaire    Feeling of Stress : Not at all  Social Connections: Moderately Isolated (07/09/2023)   Social Connection and Isolation Panel [NHANES]    Frequency of Communication with Friends and Family: More than three times a week    Frequency of Social Gatherings with Friends and Family: More than three times a week    Attends  Religious Services: More than 4 times per year    Active Member of [employer] or Organizations: No    Attends Engineer, structural: Never    Marital Status: Divorced    Tobacco Counseling Counseling given: Not Answered Tobacco comments: smoking cessation materials not required   Clinical Intake:  Pre-visit preparation completed: Yes  Pain : No/denies pain Pain Score: 0-No pain     BMI - recorded: 28.72 Nutritional Status: BMI 25 -29 Overweight Nutritional Risks: None Diabetes: No  How often do you need to have someone help you when you read instructions, pamphlets, or other written materials from your doctor or pharmacy?: 1 - Never  Interpreter Needed?: No  Information entered by ::  Kennedy Bucker, LPN   Activities of Daily Living    07/09/2023   10:26 AM 01/13/2023    7:40 AM  In your present state of health, do you have any difficulty performing the following activities:  Hearing? 0 0  Vision? 0 0  Difficulty concentrating or making decisions? 0 0  Walking or climbing stairs? 0 0  Dressing or bathing? 0 0  Doing errands, shopping? 0   Preparing Food and eating ? N   Using the Toilet? N   In the past six months, have you accidently leaked urine? N   Do you have problems with loss of bowel control? N   Managing your Medications? N   Managing your Finances? N   Housekeeping or managing your Housekeeping? N     Patient Care Team: Reubin Milan, MD as PCP - General (Internal Medicine) Nevada Crane, MD as Consulting Physician (Ophthalmology) Vernie Murders, MD (Otolaryngology)  Indicate any recent Medical Services you may have received from other than [facility] providers in the past year (date may be approximate).     Assessment:   This is a routine wellness examination for [place].  Hearing/Vision screen Hearing Screening - Comments:: NO AIDS Vision Screening - Comments:: READERS- DR.KING   Goals Addressed             This Visit's Progress     DIET - EAT MORE FRUITS AND VEGETABLES         Depression Screen    07/09/2023   10:23 AM 05/27/2023    9:25 AM 11/22/2022    9:33 AM 07/10/2022    1:50 PM 07/05/2022   10:09 AM 05/23/2022    9:45 AM 11/20/2021    9:40 AM  PHQ 2/9 Scores  PHQ - 2 Score 0 0 0 0 0 0 0  PHQ- 9 Score 0 0 0 0 0 0 1    Fall Risk    07/09/2023   10:26 AM 05/27/2023    9:25 AM 11/22/2022    9:33 AM 07/10/2022    1:50 PM 07/05/2022   10:11 AM  Fall Risk   Falls in the past year? 0 0 0 0 0  Number falls in past yr: 0 0 0 0 0  Injury with Fall? 0 0 0 0 0  Risk for fall due to : No Fall Risks No Fall Risks No Fall Risks No Fall Risks No Fall Risks  Follow up Falls prevention discussed;Falls evaluation completed Falls evaluation completed Falls evaluation completed Falls evaluation completed Falls evaluation completed    MEDICARE RISK AT HOME: Medicare Risk at Home Any stairs in or around the home?: Yes If so, are there any without handrails?: No Home free of loose throw rugs in walkways, pet beds, electrical cords, etc?: Yes Adequate lighting in your home to reduce risk of falls?: Yes Life alert?: No Use of a cane, walker or w/c?: No Grab bars in the bathroom?: Yes Shower chair or bench in shower?: Yes Elevated toilet seat or a handicapped toilet?: Yes  TIMED UP AND GO:  Was the test performed?  Yes  Length of time to ambulate 10 feet: 4 sec Gait steady and fast without use of assistive device    Cognitive Function:        07/09/2023   10:27 AM 07/05/2022   10:12 AM 05/10/2020   11:00 AM 05/10/2019   10:54 AM 01/19/2018   10:10 AM  6CIT Screen  What Year? 0 points 0 points 0  points 0 points 0 points  What month? 0 points 0 points 0 points 0 points 0 points  What time? 0 points 0 points 0 points 0 points 0 points  Count back from 20 0 points 2 points 0 points 0 points 0 points  Months in reverse 0 points 0 points 4 points 2 points 0 points  Repeat phrase 0 points 0 points 0 points 4  points 2 points  Total Score 0 points 2 points 4 points 6 points 2 points    Immunizations Immunization History  Administered Date(s) Administered   Fluad Quad(high Dose 65+) 05/10/2019, 05/10/2020, 04/06/2021, 05/23/2022   Fluad Trivalent(High Dose 65+) 05/27/2023   Influenza, High Dose Seasonal PF 05/23/2017, 04/17/2018   Influenza-Unspecified 05/11/2015, 05/23/2017, 06/22/2019   Moderna Sars-Covid-2 Vaccination 09/20/2019, 10/18/2019, 06/27/2020   PNEUMOCOCCAL CONJUGATE-20 05/27/2023   Pfizer(Comirnaty)Fall Seasonal Vaccine 12 years and older 07/10/2022   Pneumococcal Conjugate-13 06/07/2015   Pneumococcal Polysaccharide-23 12/01/2012   Tdap 11/27/2009, 04/30/2022   Zoster Recombinant(Shingrix) 06/22/2019, 01/27/2020   Zoster, Live 01/11/2011    TDAP status: Up to date  Flu Vaccine status: Up to date  Pneumococcal vaccine status: Up to date  Covid-19 vaccine status: Completed vaccines  Qualifies for Shingles Vaccine? Yes   Zostavax completed Yes   Shingrix Completed?: Yes  Screening Tests Health Maintenance  Topic Date Due   COVID-19 Vaccine (5 - 2023-24 season) 03/30/2023   MAMMOGRAM  07/24/2023   Medicare Annual Wellness (AWV)  07/08/2024   Colonoscopy  07/12/2026   DTaP/Tdap/Td (3 - Td or Tdap) 04/30/2032   Pneumonia Vaccine 98+ Years old  Completed   INFLUENZA VACCINE  Completed   DEXA SCAN  Completed   Hepatitis C Screening  Completed   Zoster Vaccines- Shingrix  Completed   HPV VACCINES  Aged Out    Health Maintenance  Health Maintenance Due  Topic Date Due   COVID-19 Vaccine (5 - 2023-24 season) 03/30/2023    Colorectal cancer screening: Type of screening: Colonoscopy. Completed 07/12/21. Repeat every 5 years  Mammogram status: Completed SCHEDULED 07/29/23. Repeat every year  Bone Density status: Completed 06/13/20. Results reflect: Bone density results: OSTEOPOROSIS. Repeat every 2 years.- DECLINED REFERRAL, WANTS TO TALK TO MD FIRST  Lung  Cancer Screening: (Low Dose CT Chest recommended if Age 37-80 years, 20 pack-year currently smoking OR have quit w/in 15years.) does not qualify.   Additional Screening:  Hepatitis C Screening: does qualify; Completed 12/05/15  Vision Screening: Recommended annual ophthalmology exams for early detection of glaucoma and other disorders of the eye. Is the patient up to date with their annual eye exam?  Yes  Who is the provider or what is the name of the office in which the patient attends annual eye exams? DR.Brooke Dare If pt is not established with a provider, would they like to be referred to a provider to establish care? No .   Dental Screening: Recommended annual dental exams for proper oral hygiene   Community Resource Referral / Chronic Care Management: CRR required this visit?  No   CCM required this visit?  No     Plan:     I have personally reviewed and noted the following in the patient's chart:   Medical and social history Use of alcohol, tobacco or illicit drugs  Current medications and supplements including opioid prescriptions. Patient is not currently taking opioid prescriptions. Functional ability and status Nutritional status Physical activity Advanced directives List of other physicians Hospitalizations, surgeries, and ER visits in previous 12 months  Vitals Screenings to include cognitive, depression, and falls Referrals and appointments  In addition, I have reviewed and discussed with patient certain preventive protocols, quality metrics, and best practice recommendations. A written personalized care plan for preventive services as well as general preventive health recommendations were provided to patient.     Hal Hope, LPN   01/60/1093   After Visit Summary: (In Person-Declined) Patient declined AVS at this time.- ON [facility]  Nurse Notes: NONE

## 2023-07-09 NOTE — Patient Instructions (Addendum)
Bethany Martinez , Thank you for taking time to come for your Medicare Wellness Visit. I appreciate your ongoing commitment to your health goals. Please review the following plan we discussed and let me know if I can assist you in the future.   Referrals/Orders/Follow-Ups/Clinician Recommendations: NONE  This is a list of the screening recommended for you and due dates:  Health Maintenance  Topic Date Due   COVID-19 Vaccine (5 - 2023-24 season) 03/30/2023   Mammogram  07/24/2023   Medicare Annual Wellness Visit  07/08/2024   Colon Cancer Screening  07/12/2026   DTaP/Tdap/Td vaccine (3 - Td or Tdap) 04/30/2032   Pneumonia Vaccine  Completed   Flu Shot  Completed   DEXA scan (bone density measurement)  Completed   Hepatitis C Screening  Completed   Zoster (Shingles) Vaccine  Completed   HPV Vaccine  Aged Out    Advanced directives: (ACP Link)Information on Advanced Care Planning can be found at [facility] Advance Health Care Directives Advance Health Care Directives ([email or web address]   Next Medicare Annual Wellness Visit scheduled for next year: Yes   07/15/23 @ 10:10 AM IN PERSON

## 2023-07-18 ENCOUNTER — Other Ambulatory Visit: Payer: Self-pay | Admitting: Internal Medicine

## 2023-07-18 DIAGNOSIS — E782 Mixed hyperlipidemia: Secondary | ICD-10-CM

## 2023-07-18 NOTE — Telephone Encounter (Signed)
Requested Prescriptions  Pending Prescriptions Disp Refills   rosuvastatin (CRESTOR) 5 MG tablet [Pharmacy Med Name: ROSUVASTATIN CALCIUM 5 MG TAB] 36 tablet 1    Sig: TAKE 1 TABLET BY MOUTH THREE TIMES A WEEK.     Cardiovascular:  Antilipid - Statins 2 Failed - 07/18/2023 11:42 AM      Failed - Lipid Panel in normal range within the last 12 months    Cholesterol, Total  Date Value Ref Range Status  05/27/2023 218 (H) 100 - 199 mg/dL Final   LDL Chol Calc (NIH)  Date Value Ref Range Status  05/27/2023 140 (H) 0 - 99 mg/dL Final   HDL  Date Value Ref Range Status  05/27/2023 56 >39 mg/dL Final   Triglycerides  Date Value Ref Range Status  05/27/2023 125 0 - 149 mg/dL Final         Passed - Cr in normal range and within 360 days    Creatinine, Ser  Date Value Ref Range Status  05/27/2023 0.91 0.57 - 1.00 mg/dL Final         Passed - Patient is not pregnant      Passed - Valid encounter within last 12 months    Recent Outpatient Visits           1 month ago Annual physical exam   [place] [facility] at [facility], Nyoka Cowden, MD   7 months ago Essential (primary) hypertension   [place] [facility] at [facility], Nyoka Cowden, MD   1 year ago Essential (primary) hypertension   [place] [facility] at [facility], Nyoka Cowden, MD   1 year ago Annual physical exam   [facility] Primary Care & Sports Medicine at [facility], Nyoka Cowden, MD   1 year ago Essential (primary) hypertension   [place] [facility] at [facility], Nyoka Cowden, MD       Future Appointments             In 3 months Judithann Graves, Nyoka Cowden, MD [facility] Health Primary Care & Sports Medicine at [facility], [facility]   In 10 months Judithann Graves, Nyoka Cowden, MD [facility] Health Primary Care & Sports Medicine at [facility], [facility]

## 2023-07-29 ENCOUNTER — Ambulatory Visit
Admission: RE | Admit: 2023-07-29 | Discharge: 2023-07-29 | Disposition: A | Discharge status: Home / Self Care | Payer: Medicare Other | Source: Ambulatory Visit | Attending: Internal Medicine | Admitting: Internal Medicine

## 2023-07-29 DIAGNOSIS — Z1231 Encounter for screening mammogram for malignant neoplasm of breast: Secondary | ICD-10-CM | POA: Insufficient documentation

## 2023-07-31 ENCOUNTER — Other Ambulatory Visit: Payer: Self-pay | Admitting: Internal Medicine

## 2023-07-31 DIAGNOSIS — I10 Essential (primary) hypertension: Secondary | ICD-10-CM

## 2023-08-04 NOTE — Telephone Encounter (Signed)
 Requested Prescriptions  Refused Prescriptions Disp Refills   losartan  (COZAAR ) 100 MG tablet [Pharmacy Med Name: LOSARTAN  POTASSIUM 100 MG TAB] 90 tablet 1    Sig: TAKE 1 TABLET BY MOUTH EVERY DAY     Cardiovascular:  Angiotensin Receptor Blockers Failed - 08/04/2023  9:25 AM      Failed - Last BP in normal range    BP Readings from Last 1 Encounters:  07/09/23 (!) 146/80         Passed - Cr in normal range and within 180 days    Creatinine, Ser  Date Value Ref Range Status  05/27/2023 0.91 0.57 - 1.00 mg/dL Final         Passed - K in normal range and within 180 days    Potassium  Date Value Ref Range Status  05/27/2023 3.7 3.5 - 5.2 mmol/L Final         Passed - Patient is not pregnant      Passed - Valid encounter within last 6 months    Recent Outpatient Visits           2 months ago Annual physical exam   [place] [facility] at [facility], Leita DEL, MD   8 months ago Essential (primary) hypertension   [place] [facility] at [facility], Leita DEL, MD   1 year ago Essential (primary) hypertension   [place] [facility] at [facility], Leita DEL, MD   1 year ago Annual physical exam   [facility] Care & Sports Medicine at [facility], Leita DEL, MD   1 year ago Essential (primary) hypertension   [place] [facility] at [facility], Leita DEL, MD       Future Appointments             In 3 months Just[place], Leita DEL, MD [facility] Health Primary Care & Sports Medicine at [facility], [facility]   In 9 months Justus, Leita DEL, MD [facility] Health Primary Care & Sports Medicine at [facility], [facility]

## 2023-08-08 DIAGNOSIS — Z961 Presence of intraocular lens: Secondary | ICD-10-CM | POA: Diagnosis not present

## 2023-08-08 DIAGNOSIS — H26491 Other secondary cataract, right eye: Secondary | ICD-10-CM | POA: Diagnosis not present

## 2023-08-08 DIAGNOSIS — H401131 Primary open-angle glaucoma, bilateral, mild stage: Secondary | ICD-10-CM | POA: Diagnosis not present

## 2023-08-25 ENCOUNTER — Other Ambulatory Visit: Payer: Self-pay | Admitting: Internal Medicine

## 2023-08-25 DIAGNOSIS — E782 Mixed hyperlipidemia: Secondary | ICD-10-CM

## 2023-08-28 NOTE — Telephone Encounter (Signed)
Requested medication (s) are due for refill today: No  Requested medication (s) are on the active medication list: Yes  Last refill:  07/18/23  Future visit scheduled: Yes  Notes to clinic:  Requests 1 year supply.    Requested Prescriptions  Pending Prescriptions Disp Refills   rosuvastatin (CRESTOR) 5 MG tablet [Pharmacy Med Name: Rosuvastatin Calcium 5 MG Oral Tablet] 43 tablet 2    Sig: TAKE 1 TABLET BY MOUTH 3 TIMES  WEEKLY     Cardiovascular:  Antilipid - Statins 2 Failed - 08/28/2023  8:42 AM      Failed - Lipid Panel in normal range within the last 12 months    Cholesterol, Total  Date Value Ref Range Status  05/27/2023 218 (H) 100 - 199 mg/dL Final   LDL Chol Calc (NIH)  Date Value Ref Range Status  05/27/2023 140 (H) 0 - 99 mg/dL Final   HDL  Date Value Ref Range Status  05/27/2023 56 >39 mg/dL Final   Triglycerides  Date Value Ref Range Status  05/27/2023 125 0 - 149 mg/dL Final         Passed - Cr in normal range and within 360 days    Creatinine, Ser  Date Value Ref Range Status  05/27/2023 0.91 0.57 - 1.00 mg/dL Final         Passed - Patient is not pregnant      Passed - Valid encounter within last 12 months    Recent Outpatient Visits           3 months ago Annual physical exam   [place] [facility] at [facility], Nyoka Cowden, MD   9 months ago Essential (primary) hypertension   [place] [facility] at [facility], Nyoka Cowden, MD   1 year ago Essential (primary) hypertension   [place] [facility] at [facility], Nyoka Cowden, MD   1 year ago Annual physical exam   [facility] Care & Sports Medicine at [facility], Nyoka Cowden, MD   1 year ago Essential (primary) hypertension    Primary Care & Sports Medicine at [facility], Nyoka Cowden, MD       Future Appointments             In 2 months Judithann Graves,  Nyoka Cowden, MD [facility] Health Primary Care & Sports Medicine at [facility], [facility]   In 9 months Judithann Graves, Nyoka Cowden, MD [facility] Health Primary Care & Sports Medicine at [facility], [facility]

## 2023-08-29 DIAGNOSIS — H26491 Other secondary cataract, right eye: Secondary | ICD-10-CM | POA: Diagnosis not present

## 2023-08-30 ENCOUNTER — Other Ambulatory Visit: Payer: Self-pay | Admitting: Internal Medicine

## 2023-08-30 DIAGNOSIS — I10 Essential (primary) hypertension: Secondary | ICD-10-CM

## 2023-09-02 NOTE — Telephone Encounter (Signed)
Requested Prescriptions  Pending Prescriptions Disp Refills   losartan (COZAAR) 100 MG tablet [Pharmacy Med Name: Losartan Potassium 100 MG Oral Tablet] 100 tablet 0    Sig: TAKE 1 TABLET BY MOUTH DAILY     Cardiovascular:  Angiotensin Receptor Blockers Failed - 09/02/2023  7:39 AM      Failed - Last BP in normal range    BP Readings from Last 1 Encounters:  07/09/23 (!) 146/80         Passed - Cr in normal range and within 180 days    Creatinine, Ser  Date Value Ref Range Status  05/27/2023 0.91 0.57 - 1.00 mg/dL Final         Passed - K in normal range and within 180 days    Potassium  Date Value Ref Range Status  05/27/2023 3.7 3.5 - 5.2 mmol/L Final         Passed - Patient is not pregnant      Passed - Valid encounter within last 6 months    Recent Outpatient Visits           3 months ago Annual physical exam   [place] [facility] at [facility] Nyoka Cowden, MD   9 months ago Essential (primary) hypertension   [place] [facility] at [facility], Nyoka Cowden, MD   1 year ago Essential (primary) hypertension   [place] [facility] at [facility], Nyoka Cowden, MD   1 year ago Annual physical exam   [facility] Primary Care & Sports Medicine at [facility], Nyoka Cowden, MD   1 year ago Essential (primary) hypertension   [place] [facility] Medicine at [facility], Nyoka Cowden, MD       Future Appointments             In 2 months Judithann Graves, Nyoka Cowden, MD [facility] Primary Care & Sports Medicine at [facility], [facility]   In 8 months Judithann Graves, Nyoka Cowden, MD [facility] Health Primary Care & Sports Medicine at [facility], [facility]

## 2023-09-09 ENCOUNTER — Other Ambulatory Visit: Payer: Self-pay | Admitting: Internal Medicine

## 2023-09-09 DIAGNOSIS — I10 Essential (primary) hypertension: Secondary | ICD-10-CM

## 2023-09-10 NOTE — Telephone Encounter (Signed)
Requested Prescriptions  Pending Prescriptions Disp Refills   hydrochlorothiazide (HYDRODIURIL) 25 MG tablet [Pharmacy Med Name: hydroCHLOROthiazide 25 MG Oral Tablet] 100 tablet 1    Sig: TAKE 1 TABLET BY MOUTH DAILY     Cardiovascular: Diuretics - Thiazide Failed - 09/10/2023  2:17 PM      Failed - Last BP in normal range    BP Readings from Last 1 Encounters:  07/09/23 (!) 146/80         Passed - Cr in normal range and within 180 days    Creatinine, Ser  Date Value Ref Range Status  05/27/2023 0.91 0.57 - 1.00 mg/dL Final         Passed - K in normal range and within 180 days    Potassium  Date Value Ref Range Status  05/27/2023 3.7 3.5 - 5.2 mmol/L Final         Passed - Na in normal range and within 180 days    Sodium  Date Value Ref Range Status  05/27/2023 139 134 - 144 mmol/L Final         Passed - Valid encounter within last 6 months    Recent Outpatient Visits           3 months ago Annual physical exam   [place] [facility] at [facility], Nyoka Cowden, MD   9 months ago Essential (primary) hypertension   [place] [facility] at [facility], Nyoka Cowden, MD   1 year ago Essential (primary) hypertension   [place] [facility] at [facility], Nyoka Cowden, MD   1 year ago Annual physical exam   [facility] at [facility], Nyoka Cowden, MD   1 year ago Essential (primary) hypertension   [place] [facility] at [facility], Nyoka Cowden, MD       Future Appointments             In 1 month Judithann Graves, Nyoka Cowden, MD [facility] at [facility], [facility]   In 8 months Judithann Graves, Nyoka Cowden, MD [facility] at [facility], [facility]

## 2023-09-19 DIAGNOSIS — H26491 Other secondary cataract, right eye: Secondary | ICD-10-CM | POA: Diagnosis not present

## 2023-11-05 ENCOUNTER — Ambulatory Visit (INDEPENDENT_AMBULATORY_CARE_PROVIDER_SITE_OTHER): Payer: Self-pay | Admitting: Internal Medicine

## 2023-11-05 ENCOUNTER — Encounter: Payer: Self-pay | Admitting: Internal Medicine

## 2023-11-05 VITALS — BP 122/74 | Ht 61.0 in | Wt 157.0 lb

## 2023-11-05 DIAGNOSIS — L309 Dermatitis, unspecified: Secondary | ICD-10-CM

## 2023-11-05 DIAGNOSIS — R7303 Prediabetes: Secondary | ICD-10-CM

## 2023-11-05 DIAGNOSIS — I10 Essential (primary) hypertension: Secondary | ICD-10-CM | POA: Diagnosis not present

## 2023-11-05 LAB — POCT GLYCOSYLATED HEMOGLOBIN (HGB A1C): Hemoglobin A1C: 6.2 % — AB (ref 4.0–5.6)

## 2023-11-05 MED ORDER — TRIAMCINOLONE ACETONIDE 0.1 % EX OINT
1.0000 | TOPICAL_OINTMENT | Freq: Two times a day (BID) | CUTANEOUS | 0 refills | Status: AC
Start: 2023-11-05 — End: ?

## 2023-11-05 MED ORDER — LOSARTAN POTASSIUM 100 MG PO TABS: 100.0000 mg | ORAL_TABLET | Freq: Every day | ORAL | 3 refills | Status: DC

## 2023-11-05 NOTE — Progress Notes (Signed)
 Date:  11/05/2023   Name:  Bethany Martinez   DOB:  03-Aug-1945   MRN:  [id number]   Chief Complaint: Hypertension and Prediabetes  Hypertension This is a chronic problem. The problem is controlled. Pertinent negatives include no chest pain, headaches, palpitations or shortness of breath. Past treatments include angiotensin blockers and diuretics. The current treatment provides significant improvement. There is no history of kidney disease, CAD/MI or CVA.  Diabetes She presents for her follow-up diabetic visit. Diabetes type: prediabetes. Pertinent negatives for hypoglycemia include no dizziness, headaches or nervousness/anxiousness. Associated symptoms include fatigue (not as much energy as she would like but stays on the go). Pertinent negatives for diabetes include no chest pain and no weakness. Pertinent negatives for diabetic complications include no CVA. Current diabetic treatment includes diet.    Review of Systems  Constitutional:  Positive for fatigue (not as much energy as she would like but stays on the go). Negative for chills, fever and unexpected weight change.  HENT:  Negative for trouble swallowing.   Eyes:  Negative for visual disturbance.  Respiratory:  Negative for cough, chest tightness, shortness of breath and wheezing.   Cardiovascular:  Negative for chest pain, palpitations and leg swelling.  Gastrointestinal:  Negative for abdominal pain, constipation and diarrhea.  Musculoskeletal:  Negative for arthralgias and myalgias.  Neurological:  Negative for dizziness, weakness, light-headedness and headaches.  Psychiatric/Behavioral:  Negative for dysphoric mood and sleep disturbance. The patient is not nervous/anxious.      Lab Results  Component Value Date   NA 139 05/27/2023   K 3.7 05/27/2023   CO2 25 05/27/2023   GLUCOSE 98 05/27/2023   BUN 16 05/27/2023   CREATININE 0.91 05/27/2023   CALCIUM 10.0 05/27/2023   EGFR 65 05/27/2023   GFRNONAA >60 03/26/2022    Lab Results  Component Value Date   CHOL 218 (H) 05/27/2023   HDL 56 05/27/2023   LDLCALC 140 (H) 05/27/2023   TRIG 125 05/27/2023   CHOLHDL 3.9 05/27/2023   Lab Results  Component Value Date   TSH 1.450 05/27/2023   Lab Results  Component Value Date   HGBA1C 6.2 (A) 11/05/2023   Lab Results  Component Value Date   WBC 6.6 05/27/2023   HGB 12.5 05/27/2023   HCT 38.5 05/27/2023   MCV 88 05/27/2023   PLT 299 05/27/2023   Lab Results  Component Value Date   ALT 22 05/27/2023   AST 18 05/27/2023   ALKPHOS 87 05/27/2023   BILITOT 0.7 05/27/2023   Lab Results  Component Value Date   VD25OH 36.4 05/27/2023     Patient Active Problem List   Diagnosis Date Noted   Colon cancer screening    Polyp of descending colon    Age-related osteoporosis without fracture 05/18/2020   Mixed hyperlipidemia 05/18/2019   Eczema 07/18/2017   Prediabetes 06/07/2015   Edema of extremities 01/11/2015   Allergy 01/11/2015   Essential (primary) hypertension 01/11/2015    Allergies  Allergen Reactions   Atorvastatin Other (See Comments)    myalgia    Past Surgical History:  Procedure Laterality Date   ABDOMINAL HYSTERECTOMY  1988   partial   CATARACT EXTRACTION W/PHACO Left 12/09/2022   Procedure: CATARACT EXTRACTION PHACO AND INTRAOCULAR LENS PLACEMENT (IOC) LEFT HYDRUS MICROSTENT  CLAREON VIVITY TORIC LENS  3.89  00:31.1;  Surgeon: Nevada Crane, MD;  Location: [facility];  Service: Ophthalmology;  Laterality: Left;   CATARACT EXTRACTION W/PHACO Right 01/13/2023  Procedure: CATARACT EXTRACTION PHACO AND INTRAOCULAR LENS PLACEMENT (IOC) RIGHT HYDRUS MICROSTENT  CLAREON VIVITY  LENS  3.28  00:23.8;  Surgeon: Nevada Crane, MD;  Location: [facility];  Service: Ophthalmology;  Laterality: Right;   COLONOSCOPY  01/2009   Dr. Evette Cristal   COLONOSCOPY WITH PROPOFOL N/A 07/12/2021   Procedure: COLONOSCOPY WITH BIOPSY;  Surgeon: Toney Reil, MD;  Location:  [facility];  Service: Endoscopy;  Laterality: N/A;   POLYPECTOMY N/A 07/12/2021   Procedure: POLYPECTOMY;  Surgeon: Toney Reil, MD;  Location: [facility];  Service: Endoscopy;  Laterality: N/A;    Social History   Tobacco Use   Smoking status: Never   Smokeless tobacco: Never   Tobacco comments:    smoking cessation materials not required  Vaping Use   Vaping status: Never Used  Substance Use Topics   Alcohol use: No    Alcohol/week: 0.0 standard drinks of alcohol   Drug use: No     Medication list has been reviewed and updated.  Current Meds  Medication Sig   aspirin EC 81 MG tablet Take 81 mg by mouth daily. Patient taking 3 times per week   Calcium Carb-Cholecalciferol (SUPER CALCIUM 600 + D 400 PO) Take 1 tablet by mouth 2 (two) times daily.   Garlic 1000 MG CAPS Take by mouth daily.   hydrochlorothiazide (HYDRODIURIL) 25 MG tablet TAKE 1 TABLET BY MOUTH DAILY   latanoprost (XALATAN) 0.005 % ophthalmic solution SMARTSIG:In Eye(s)   Omega-3 Fatty Acids (OMEGA 3 500 PO) Take by mouth daily.   rosuvastatin (CRESTOR) 5 MG tablet TAKE 1 TABLET BY MOUTH 3 TIMES  WEEKLY   TURMERIC CURCUMIN PO Take by mouth.   [DISCONTINUED] losartan (COZAAR) 100 MG tablet TAKE 1 TABLET BY MOUTH DAILY   [DISCONTINUED] triamcinolone ointment (KENALOG) 0.1 % Apply 1 application topically 2 (two) times daily. To knees and elbows       11/05/2023    9:40 AM 05/27/2023    9:25 AM 11/22/2022    9:33 AM 07/10/2022    1:50 PM  GAD 7 : Generalized Anxiety Score  Nervous, Anxious, on Edge 0 0 0 0  Control/stop worrying 0 0 0 0  Worry too much - different things 0 0 0 0  Trouble relaxing 0 0 0 0  Restless 0 0 0 0  Easily annoyed or irritable 0 0 0 0  Afraid - awful might happen 0 0 0 0  Total GAD 7 Score 0 0 0 0  Anxiety Difficulty Not difficult at all Not difficult at all Not difficult at all Not difficult at all       11/05/2023    9:40 AM 07/09/2023   10:23 AM  05/27/2023    9:25 AM  Depression screen PHQ 2/9  Decreased Interest 0 0 0  Down, Depressed, Hopeless 0 0 0  PHQ - 2 Score 0 0 0  Altered sleeping 0 0 0  Tired, decreased energy 0 0 0  Change in appetite 0 0 0  Feeling bad or failure about yourself  0 0 0  Trouble concentrating 0 0 0  Moving slowly or fidgety/restless 0 0 0  Suicidal thoughts 0 0 0  PHQ-9 Score 0 0 0  Difficult doing work/chores Not difficult at all Not difficult at all Not difficult at all    BP Readings from Last 3 Encounters:  11/05/23 122/74  07/09/23 (!) 146/80  05/27/23 132/74    Physical Exam Vitals and  nursing note reviewed.  Constitutional:      General: She is not in acute distress.    Appearance: Normal appearance. She is well-developed.  HENT:     Head: Normocephalic and atraumatic.  Cardiovascular:     Rate and Rhythm: Normal rate and regular rhythm.     Heart sounds: No murmur heard. Pulmonary:     Effort: Pulmonary effort is normal. No respiratory distress.     Breath sounds: No wheezing or rhonchi.  Musculoskeletal:     Right lower leg: No edema.     Left lower leg: No edema.  Lymphadenopathy:     Cervical: No cervical adenopathy.  Skin:    General: Skin is warm and dry.     Findings: No rash.  Neurological:     General: No focal deficit present.     Mental Status: She is alert and oriented to person, place, and time.  Psychiatric:        Mood and Affect: Mood normal.        Behavior: Behavior normal.     Wt Readings from Last 3 Encounters:  11/05/23 157 lb (71.2 kg)  07/09/23 152 lb (68.9 kg)  05/27/23 153 lb (69.4 kg)    BP 122/74   Ht 5\' 1"  (1.549 m)   Wt 157 lb (71.2 kg)   BMI 29.66 kg/m   Assessment and Plan:  Problem List Items Addressed This Visit       Unprioritized   Essential (primary) hypertension - Primary (Chronic)   Blood pressure is well controlled.  Current medications - losartan and hctz Will continue same regimen along with efforts to limit  dietary sodium.       Relevant Medications   losartan (COZAAR) 100 MG tablet   Prediabetes (Chronic)   Managed with diet changes.  She is staying active with gardening, etc. Lab Results  Component Value Date   HGBA1C 6.3 (H) 05/27/2023  A1C = 6.2 today.  Continue work with diet and regular physical activity.       Relevant Orders   POCT glycosylated hemoglobin (Hb A1C) (Completed)   Eczema   Relevant Medications   triamcinolone ointment (KENALOG) 0.1 %    No follow-ups on file.    Reubin Milan, MD [facility] [place]

## 2023-11-05 NOTE — Assessment & Plan Note (Addendum)
 Managed with diet changes.  She is staying active with gardening, etc. Lab Results  Component Value Date   HGBA1C 6.3 (H) 05/27/2023  A1C = 6.2 today.  Continue work with diet and regular physical activity.

## 2023-11-05 NOTE — Patient Instructions (Addendum)
 Try Allegra or Claritin instead of Zyrtec for allergy symptoms .  You can also take a B12 supplement to help with energy if desired.

## 2023-11-05 NOTE — Assessment & Plan Note (Signed)
 Blood pressure is well controlled.  Current medications - losartan and hctz Will continue same regimen along with efforts to limit dietary sodium.

## 2023-12-19 ENCOUNTER — Other Ambulatory Visit: Payer: Self-pay | Admitting: Internal Medicine

## 2023-12-19 DIAGNOSIS — I10 Essential (primary) hypertension: Secondary | ICD-10-CM

## 2023-12-23 NOTE — Telephone Encounter (Signed)
 Requested Prescriptions  Refused Prescriptions Disp Refills   hydrochlorothiazide  (HYDRODIURIL ) 25 MG tablet [Pharmacy Med Name: hydroCHLOROthiazide  25 MG Oral Tablet] 100 tablet 2    Sig: TAKE 1 TABLET BY MOUTH DAILY     Cardiovascular: Diuretics - Thiazide Failed - 12/23/2023 11:57 AM      Failed - Cr in normal range and within 180 days    Creatinine, Ser  Date Value Ref Range Status  05/27/2023 0.91 0.57 - 1.00 mg/dL Final         Failed - K in normal range and within 180 days    Potassium  Date Value Ref Range Status  05/27/2023 3.7 3.5 - 5.2 mmol/L Final         Failed - Na in normal range and within 180 days    Sodium  Date Value Ref Range Status  05/27/2023 139 134 - 144 mmol/L Final         Passed - Last BP in normal range    BP Readings from Last 1 Encounters:  11/05/23 122/74         Passed - Valid encounter within last 6 months    Recent Outpatient Visits           1 month ago Essential (primary) hypertension   [place] [facility] at [facility], Chales [place], MD       Future Appointments             In 5 months Gala Jubilee [place]ales [place], MD [facility] at [facility], [facility]

## 2024-02-17 DIAGNOSIS — H401131 Primary open-angle glaucoma, bilateral, mild stage: Secondary | ICD-10-CM | POA: Diagnosis not present

## 2024-02-17 DIAGNOSIS — Z961 Presence of intraocular lens: Secondary | ICD-10-CM | POA: Diagnosis not present

## 2024-02-17 DIAGNOSIS — Z9889 Other specified postprocedural states: Secondary | ICD-10-CM | POA: Diagnosis not present

## 2024-02-19 DIAGNOSIS — L239 Allergic contact dermatitis, unspecified cause: Secondary | ICD-10-CM | POA: Diagnosis not present

## 2024-02-20 ENCOUNTER — Encounter: Payer: Self-pay | Admitting: Pharmacist

## 2024-02-20 NOTE — Progress Notes (Signed)
   02/20/2024  Patient ID: Bethany Martinez, female   DOB: [birth year], 78 y.o.   MRN: [id number]  This patient is appearing on a report for being at risk of failing the adherence measure for cholesterol (statin) medications this calendar year.   Medication: rosuvastatin  5 mg Last fill date: 02/06/2024 for 100 day supply  Insurance report was not up to date, but outreach to discuss adherence as previous fill was 09/15/2023.  Left voicemail for patient to return my call at their convenience.   Sharyle Sia, PharmD, [facility] [phone]

## 2024-02-21 ENCOUNTER — Other Ambulatory Visit: Payer: Self-pay | Admitting: Internal Medicine

## 2024-02-21 DIAGNOSIS — I10 Essential (primary) hypertension: Secondary | ICD-10-CM

## 2024-02-23 NOTE — Telephone Encounter (Signed)
 Requested medication (s) are due for refill today: yes  Requested medication (s) are on the active medication list: yes  Last refill:  09/10/23 #100 1 RF  Future visit scheduled: yes  Notes to clinic:  overdue lab work   Requested Prescriptions  Pending Prescriptions Disp Refills   hydrochlorothiazide  (HYDRODIURIL ) 25 MG tablet [Pharmacy Med Name: hydroCHLOROthiazide  25 MG Oral Tablet] 100 tablet 2    Sig: TAKE 1 TABLET BY MOUTH DAILY     Cardiovascular: Diuretics - Thiazide Failed - 02/23/2024  3:46 PM      Failed - Cr in normal range and within 180 days    Creatinine, Ser  Date Value Ref Range Status  05/27/2023 0.91 0.57 - 1.00 mg/dL Final         Failed - K in normal range and within 180 days    Potassium  Date Value Ref Range Status  05/27/2023 3.7 3.5 - 5.2 mmol/L Final         Failed - Na in normal range and within 180 days    Sodium  Date Value Ref Range Status  05/27/2023 139 134 - 144 mmol/L Final         Passed - Last BP in normal range    BP Readings from Last 1 Encounters:  11/05/23 122/74         Passed - Valid encounter within last 6 months    Recent Outpatient Visits           3 months ago Essential (primary) hypertension   [place] [facility] at [facility], Leita DEL, MD       Future Appointments             In 3 months Justus Leita DEL, MD [facility] at [facility], [facility]

## 2024-02-24 DIAGNOSIS — H0289 Other specified disorders of eyelid: Secondary | ICD-10-CM | POA: Diagnosis not present

## 2024-02-24 DIAGNOSIS — H01003 Unspecified blepharitis right eye, unspecified eyelid: Secondary | ICD-10-CM | POA: Diagnosis not present

## 2024-02-24 DIAGNOSIS — L239 Allergic contact dermatitis, unspecified cause: Secondary | ICD-10-CM | POA: Diagnosis not present

## 2024-05-22 ENCOUNTER — Other Ambulatory Visit: Payer: Self-pay | Admitting: Internal Medicine

## 2024-05-22 DIAGNOSIS — I10 Essential (primary) hypertension: Secondary | ICD-10-CM

## 2024-05-25 NOTE — Telephone Encounter (Signed)
 Requested Prescriptions  Pending Prescriptions Disp Refills   hydrochlorothiazide  (HYDRODIURIL ) 25 MG tablet [Pharmacy Med Name: hydroCHLOROthiazide  25 MG Oral Tablet] 90 tablet 0    Sig: TAKE 1 TABLET BY MOUTH DAILY     Cardiovascular: Diuretics - Thiazide Failed - 05/25/2024  9:43 AM      Failed - Cr in normal range and within 180 days    Creatinine, Ser  Date Value Ref Range Status  05/27/2023 0.91 0.57 - 1.00 mg/dL Final         Failed - K in normal range and within 180 days    Potassium  Date Value Ref Range Status  05/27/2023 3.7 3.5 - 5.2 mmol/L Final         Failed - Na in normal range and within 180 days    Sodium  Date Value Ref Range Status  05/27/2023 139 134 - 144 mmol/L Final         Failed - Valid encounter within last 6 months    Recent Outpatient Visits           6 months ago Essential (primary) hypertension   [place] [facility] at [facility], Leita DEL, MD              Passed - Last BP in normal range    BP Readings from Last 1 Encounters:  11/05/23 122/74

## 2024-05-28 ENCOUNTER — Encounter: Payer: Self-pay | Admitting: Internal Medicine

## 2024-05-31 ENCOUNTER — Encounter: Payer: Self-pay | Admitting: Pharmacist

## 2024-05-31 NOTE — Progress Notes (Signed)
   05/31/2024  Patient ID: Bethany Martinez, female   DOB: 18-Jun-1946, 78 y.o.   MRN: [id number]  This patient is appearing on a report for being at risk of failing the adherence measure for cholesterol (statin) medications this calendar year.   Medication: rosuvastatin  5 mg Last fill date: 05/11/2024 for 100 day supply  Insurance report was not up to date; no further action needed at this time.   Sharyle Sia, PharmD, [facility] [phone]

## 2024-06-02 ENCOUNTER — Encounter: Payer: Self-pay | Admitting: Internal Medicine

## 2024-06-02 ENCOUNTER — Ambulatory Visit (INDEPENDENT_AMBULATORY_CARE_PROVIDER_SITE_OTHER): Admitting: Internal Medicine

## 2024-06-02 VITALS — BP 112/66 | HR 86 | Temp 98.1°F | Ht 61.0 in | Wt 154.0 lb

## 2024-06-02 DIAGNOSIS — E782 Mixed hyperlipidemia: Secondary | ICD-10-CM

## 2024-06-02 DIAGNOSIS — R7303 Prediabetes: Secondary | ICD-10-CM

## 2024-06-02 DIAGNOSIS — J069 Acute upper respiratory infection, unspecified: Secondary | ICD-10-CM | POA: Diagnosis not present

## 2024-06-02 DIAGNOSIS — Z1231 Encounter for screening mammogram for malignant neoplasm of breast: Secondary | ICD-10-CM | POA: Diagnosis not present

## 2024-06-02 DIAGNOSIS — I10 Essential (primary) hypertension: Secondary | ICD-10-CM

## 2024-06-02 DIAGNOSIS — Z0000 Encounter for general adult medical examination without abnormal findings: Secondary | ICD-10-CM

## 2024-06-02 MED ORDER — ROSUVASTATIN CALCIUM 5 MG PO TABS: ORAL_TABLET | ORAL | 1 refills | Status: AC

## 2024-06-02 NOTE — Progress Notes (Signed)
 Date:  06/02/2024   Name:  Bethany Martinez   DOB:  [birth year]   MRN:  [id number]   Chief Complaint: Annual Exam and Sinusitis (X 4 days. Sinus pressure, headache, nasal congestion. Sllight cough- no production. No fever, No SOB.) Bethany Martinez is a 78 y.o. female who presents today for her Complete Annual Exam. She feels poorly. She reports exercising. She reports she is sleeping well. Breast complaints - none.  Health Maintenance  Topic Date Due   COVID-19 Vaccine (5 - 2025-26 season) 03/29/2024   Medicare Annual Wellness Visit  07/08/2024   Breast Cancer Screening  07/28/2024   Flu Shot  10/26/2024*   Colon Cancer Screening  07/12/2026   DTaP/Tdap/Td vaccine (3 - Td or Tdap) 04/30/2032   Pneumococcal Vaccine for age over 13  Completed   DEXA scan (bone density measurement)  Completed   Hepatitis C Screening  Completed   Zoster (Shingles) Vaccine  Completed   Meningitis B Vaccine  Aged Out  *Topic was postponed. The date shown is not the original due date.    Hypertension This is a chronic problem. Pertinent negatives include no chest pain, headaches, palpitations or shortness of breath. Past treatments include angiotensin blockers and diuretics. The current treatment provides significant improvement.  Hyperlipidemia This is a chronic problem. The problem is controlled. Recent lipid tests were reviewed and are high. Pertinent negatives include no chest pain, myalgias or shortness of breath. Current antihyperlipidemic treatment includes statins. The current treatment provides moderate improvement of lipids.    Review of Systems  Constitutional:  Negative for chills, fatigue, fever and unexpected weight change.  HENT:  Positive for congestion and postnasal drip. Negative for trouble swallowing.   Eyes:  Negative for visual disturbance.  Respiratory:  Positive for cough. Negative for chest tightness, shortness of breath and wheezing.   Cardiovascular:  Negative for  chest pain, palpitations and leg swelling.  Gastrointestinal:  Negative for abdominal pain, constipation and diarrhea.  Musculoskeletal:  Negative for arthralgias and myalgias.  Neurological:  Negative for dizziness, weakness, light-headedness and headaches.  Psychiatric/Behavioral:  Negative for dysphoric mood and sleep disturbance. The patient is not nervous/anxious.      Lab Results  Component Value Date   NA 139 05/27/2023   K 3.7 05/27/2023   CO2 25 05/27/2023   GLUCOSE 98 05/27/2023   BUN 16 05/27/2023   CREATININE 0.91 05/27/2023   CALCIUM  10.0 05/27/2023   EGFR 65 05/27/2023   GFRNONAA >60 03/26/2022   Lab Results  Component Value Date   CHOL 218 (H) 05/27/2023   HDL 56 05/27/2023   LDLCALC 140 (H) 05/27/2023   TRIG 125 05/27/2023   CHOLHDL 3.9 05/27/2023   Lab Results  Component Value Date   TSH 1.450 05/27/2023   Lab Results  Component Value Date   HGBA1C 6.2 (A) 11/05/2023   Lab Results  Component Value Date   WBC 6.6 05/27/2023   HGB 12.5 05/27/2023   HCT 38.5 05/27/2023   MCV 88 05/27/2023   PLT 299 05/27/2023   Lab Results  Component Value Date   ALT 22 05/27/2023   AST 18 05/27/2023   ALKPHOS 87 05/27/2023   BILITOT 0.7 05/27/2023   Lab Results  Component Value Date   VD25OH 36.4 05/27/2023     Patient Active Problem List   Diagnosis Date Noted   Colon cancer screening    Polyp of descending colon    Age-related osteoporosis without fracture 05/18/2020  Mixed hyperlipidemia 05/18/2019   Eczema 07/18/2017   Prediabetes 06/07/2015   Edema of extremities 01/11/2015   Allergy 01/11/2015   Essential (primary) hypertension 01/11/2015    Allergies  Allergen Reactions   Atorvastatin  Other (See Comments)    myalgia    Past Surgical History:  Procedure Laterality Date   ABDOMINAL HYSTERECTOMY  1988   partial   CATARACT EXTRACTION W/PHACO Left 12/09/2022   Procedure: CATARACT EXTRACTION PHACO AND INTRAOCULAR LENS PLACEMENT (IOC) LEFT  HYDRUS MICROSTENT  CLAREON VIVITY TORIC LENS  3.89  00:31.1;  Surgeon: Myrna Adine Anes, MD;  Location: [facility];  Service: Ophthalmology;  Laterality: Left;   CATARACT EXTRACTION W/PHACO Right 01/13/2023   Procedure: CATARACT EXTRACTION PHACO AND INTRAOCULAR LENS PLACEMENT (IOC) RIGHT HYDRUS MICROSTENT  CLAREON VIVITY  LENS  3.28  00:23.8;  Surgeon: Myrna Adine Anes, MD;  Location: [facility];  Service: Ophthalmology;  Laterality: Right;   COLONOSCOPY  01/2009   Dr. Dellie   COLONOSCOPY WITH PROPOFOL  N/A 07/12/2021   Procedure: COLONOSCOPY WITH BIOPSY;  Surgeon: Unk Corinn Skiff, MD;  Location: [facility];  Service: Endoscopy;  Laterality: N/A;   POLYPECTOMY N/A 07/12/2021   Procedure: POLYPECTOMY;  Surgeon: Unk Corinn Skiff, MD;  Location: [facility];  Service: Endoscopy;  Laterality: N/A;    Social History   Tobacco Use   Smoking status: Never   Smokeless tobacco: Never   Tobacco comments:    smoking cessation materials not required  Vaping Use   Vaping status: Never Used  Substance Use Topics   Alcohol use: No    Alcohol/week: 0.0 standard drinks of alcohol   Drug use: No     Medication list has been reviewed and updated.  Current Meds  Medication Sig   aspirin EC 81 MG tablet Take 81 mg by mouth daily. Patient taking 3 times per week   Calcium  Carb-Cholecalciferol (SUPER CALCIUM  600 + D 400 PO) Take 1 tablet by mouth 2 (two) times daily.   Garlic 1000 MG CAPS Take by mouth daily.   hydrochlorothiazide  (HYDRODIURIL ) 25 MG tablet TAKE 1 TABLET BY MOUTH DAILY   latanoprost (XALATAN) 0.005 % ophthalmic solution SMARTSIG:In Eye(s)   losartan  (COZAAR ) 100 MG tablet Take 1 tablet (100 mg total) by mouth daily.   Omega-3 Fatty Acids (OMEGA 3 500 PO) Take by mouth daily.   triamcinolone  ointment (KENALOG ) 0.1 % Apply 1 Application topically 2 (two) times daily. To knees and elbows   TURMERIC CURCUMIN PO Take by mouth.   [DISCONTINUED]  rosuvastatin  (CRESTOR ) 5 MG tablet TAKE 1 TABLET BY MOUTH 3 TIMES  WEEKLY       06/02/2024   10:57 AM 11/05/2023    9:40 AM 05/27/2023    9:25 AM 11/22/2022    9:33 AM  GAD 7 : Generalized Anxiety Score  Nervous, Anxious, on Edge 0 0 0 0  Control/stop worrying 0 0 0 0  Worry too much - different things 0 0 0 0  Trouble relaxing 0 0 0 0  Restless 0 0 0 0  Easily annoyed or irritable 0 0 0 0  Afraid - awful might happen 0 0 0 0  Total GAD 7 Score 0 0 0 0  Anxiety Difficulty Not difficult at all Not difficult at all Not difficult at all Not difficult at all       06/02/2024   10:57 AM 11/05/2023    9:40 AM 07/09/2023   10:23 AM  Depression screen PHQ 2/9  Decreased Interest 0 0 0  Down, Depressed, Hopeless 0 0 0  PHQ - 2 Score 0 0 0  Altered sleeping 0 0 0  Tired, decreased energy 0 0 0  Change in appetite 0 0 0  Feeling bad or failure about yourself  0 0 0  Trouble concentrating 0 0 0  Moving slowly or fidgety/restless 0 0 0  Suicidal thoughts 0 0 0  PHQ-9 Score 0 0 0  Difficult doing work/chores Not difficult at all Not difficult at all Not difficult at all    BP Readings from Last 3 Encounters:  06/02/24 112/66  11/05/23 122/74  07/09/23 (!) 146/80    Physical Exam Vitals and nursing note reviewed.  Constitutional:      General: She is not in acute distress.    Appearance: She is well-developed.  HENT:     Head: Normocephalic and atraumatic.     Right Ear: Tympanic membrane and ear canal normal. Tympanic membrane is not erythematous or retracted.     Left Ear: Tympanic membrane and ear canal normal. Tympanic membrane is not erythematous or retracted.     Nose:     Right Sinus: No maxillary sinus tenderness or frontal sinus tenderness.     Left Sinus: No maxillary sinus tenderness or frontal sinus tenderness.     Mouth/Throat:     Pharynx: No pharyngeal swelling or oropharyngeal exudate.  Eyes:     General: No scleral icterus.       Right eye: No discharge.         Left eye: No discharge.     Conjunctiva/sclera: Conjunctivae normal.  Neck:     Thyroid : No thyromegaly.     Vascular: No carotid bruit.  Cardiovascular:     Rate and Rhythm: Normal rate and regular rhythm.     Pulses: Normal pulses.     Heart sounds: Normal heart sounds.  Pulmonary:     Effort: Pulmonary effort is normal. No respiratory distress.     Breath sounds: No wheezing.  Abdominal:     General: Bowel sounds are normal.     Palpations: Abdomen is soft.     Tenderness: There is no abdominal tenderness.  Musculoskeletal:     Cervical back: Normal range of motion. No erythema.     Right lower leg: No edema.     Left lower leg: No edema.  Lymphadenopathy:     Cervical: No cervical adenopathy.  Skin:    General: Skin is warm and dry.     Capillary Refill: Capillary refill takes less than 2 seconds.     Findings: No rash.  Neurological:     General: No focal deficit present.     Mental Status: She is alert and oriented to person, place, and time.     Cranial Nerves: No cranial nerve deficit.     Sensory: No sensory deficit.     Deep Tendon Reflexes: Reflexes are normal and symmetric.  Psychiatric:        Attention and Perception: Attention normal.        Mood and Affect: Mood normal.     Wt Readings from Last 3 Encounters:  06/02/24 154 lb (69.9 kg)  11/05/23 157 lb (71.2 kg)  07/09/23 152 lb (68.9 kg)    BP 112/66   Pulse 86   Temp 98.1 F (36.7 C) (Oral)   Ht 5' 1 (1.549 m)   Wt 154 lb (69.9 kg)   SpO2 99%   BMI 29.10  kg/m   Assessment and Plan:  Problem List Items Addressed This Visit       Unprioritized   Essential (primary) hypertension (Chronic)   Well controlled blood pressure today. Current regimen is losartan  and hydrochlorothiazide . No medication side effects noted.        Relevant Medications   rosuvastatin  (CRESTOR ) 5 MG tablet   Other Relevant Orders   CBC with Differential/Platelet   Comprehensive metabolic panel with GFR    TSH   Urinalysis, Routine w reflex microscopic   Prediabetes (Chronic)   Managed with diet only. Lab Results  Component Value Date   HGBA1C 6.2 (A) 11/05/2023         Relevant Orders   Hemoglobin A1c   Mixed hyperlipidemia (Chronic)   LDL is  Lab Results  Component Value Date   LDLCALC 140 (H) 05/27/2023  Current medication regimen is Crestor . Goal LDL is < 100.       Relevant Medications   rosuvastatin  (CRESTOR ) 5 MG tablet   Other Relevant Orders   Lipid panel   Other Visit Diagnoses       Annual physical exam    -  Primary   continue healthy diet and exercise schedule mammogram     Encounter for screening mammogram for breast cancer       Relevant Orders   MM 3D SCREENING MAMMOGRAM BILATERAL BREAST     Viral URI       Recommend Claritin, Flonase NS, Delsym otc cough syrup       No follow-ups on file.    Leita HILARIO Adie, MD [facility] [place]

## 2024-06-02 NOTE — Assessment & Plan Note (Signed)
 Managed with diet only. Lab Results  Component Value Date   HGBA1C 6.2 (A) 11/05/2023

## 2024-06-02 NOTE — Assessment & Plan Note (Signed)
 Well controlled blood pressure today. Current regimen is losartan  and hydrochlorothiazide . No medication side effects noted.

## 2024-06-02 NOTE — Assessment & Plan Note (Signed)
 LDL is  Lab Results  Component Value Date   LDLCALC 140 (H) 05/27/2023  Current medication regimen is Crestor . Goal LDL is < 100.

## 2024-06-02 NOTE — Patient Instructions (Addendum)
 Ms. Yamin,  Call [facility] to schedule your mammogram at [phone] due in December.  Delsym is a good over the counter cough syrup.  Take Claritin daily and continue nasal spray, tylenol, fluids and rest.  We hope you enjoyed your visit with our office! Your feedback means so much to our team, and it helps us  to continue providing the best care possible. If you had a positive experience, we'd love if you could share it by leaving us  a [employer] Review and also completing our patient survey that you'll receive soon.  Your kind words not only brighten our day but also help other patients feel confident in choosing our office for their care.  Thank you for being a part of our practice family!   Dr. Leita Adie & Eual Grieves, CMA, [facility]  Primary Care & Sports [facility] [place] [address]  [place] [address] Office [phone]  Fax: [phone]'

## 2024-06-03 ENCOUNTER — Ambulatory Visit: Payer: Self-pay | Admitting: Internal Medicine

## 2024-06-03 LAB — CBC WITH DIFFERENTIAL/PLATELET
Basophils Absolute: 0 x10E3/uL (ref 0.0–0.2)
Basos: 0 %
EOS (ABSOLUTE): 0 x10E3/uL (ref 0.0–0.4)
Eos: 1 %
Hematocrit: 36.4 % (ref 34.0–46.6)
Hemoglobin: 11.9 g/dL (ref 11.1–15.9)
Immature Grans (Abs): 0 x10E3/uL (ref 0.0–0.1)
Immature Granulocytes: 0 %
Lymphocytes Absolute: 1.5 x10E3/uL (ref 0.7–3.1)
Lymphs: 33 %
MCH: 28.1 pg (ref 26.6–33.0)
MCHC: 32.7 g/dL (ref 31.5–35.7)
MCV: 86 fL (ref 79–97)
Monocytes Absolute: 0.8 x10E3/uL (ref 0.1–0.9)
Monocytes: 18 %
Neutrophils Absolute: 2.2 x10E3/uL (ref 1.4–7.0)
Neutrophils: 47 %
Platelets: 280 x10E3/uL (ref 150–450)
RBC: 4.24 x10E6/uL (ref 3.77–5.28)
RDW: 14 % (ref 11.7–15.4)
WBC: 4.6 x10E3/uL (ref 3.4–10.8)

## 2024-06-03 LAB — URINALYSIS, ROUTINE W REFLEX MICROSCOPIC
Bilirubin, UA: NEGATIVE
Glucose, UA: NEGATIVE
Ketones, UA: NEGATIVE
Nitrite, UA: NEGATIVE
Protein,UA: NEGATIVE
RBC, UA: NEGATIVE
Specific Gravity, UA: 1.016 (ref 1.005–1.030)
Urobilinogen, Ur: 0.2 mg/dL (ref 0.2–1.0)
pH, UA: 7.5 (ref 5.0–7.5)

## 2024-06-03 LAB — COMPREHENSIVE METABOLIC PANEL WITH GFR
ALT: 18 IU/L (ref 0–32)
AST: 20 IU/L (ref 0–40)
Albumin: 4.5 g/dL (ref 3.8–4.8)
Alkaline Phosphatase: 76 IU/L (ref 49–135)
BUN/Creatinine Ratio: 15 (ref 12–28)
BUN: 15 mg/dL (ref 8–27)
Bilirubin Total: 0.5 mg/dL (ref 0.0–1.2)
CO2: 23 mmol/L (ref 20–29)
Calcium: 9.9 mg/dL (ref 8.7–10.3)
Chloride: 100 mmol/L (ref 96–106)
Creatinine, Ser: 0.98 mg/dL (ref 0.57–1.00)
Globulin, Total: 2.8 g/dL (ref 1.5–4.5)
Glucose: 90 mg/dL (ref 70–99)
Potassium: 3.5 mmol/L (ref 3.5–5.2)
Sodium: 139 mmol/L (ref 134–144)
Total Protein: 7.3 g/dL (ref 6.0–8.5)
eGFR: 59 mL/min/1.73 — ABNORMAL LOW (ref >59)

## 2024-06-03 LAB — HEMOGLOBIN A1C
Est. average glucose Bld gHb Est-mCnc: 126 mg/dL
Hgb A1c MFr Bld: 6 % — ABNORMAL HIGH (ref 4.8–5.6)

## 2024-06-03 LAB — LIPID PANEL
Chol/HDL Ratio: 3.9 ratio (ref 0.0–4.4)
Cholesterol, Total: 215 mg/dL — ABNORMAL HIGH (ref 100–199)
HDL: 55 mg/dL (ref >39)
LDL Chol Calc (NIH): 139 mg/dL — ABNORMAL HIGH (ref 0–99)
Triglycerides: 118 mg/dL (ref 0–149)
VLDL Cholesterol Cal: 21 mg/dL (ref 5–40)

## 2024-06-03 LAB — MICROSCOPIC EXAMINATION
Bacteria, UA: NONE SEEN
Casts: NONE SEEN /LPF

## 2024-06-03 LAB — TSH: TSH: 1.55 u[IU]/mL (ref 0.450–4.500)

## 2024-06-14 ENCOUNTER — Telehealth: Payer: Self-pay

## 2024-06-14 NOTE — Telephone Encounter (Signed)
:  eGFR  and uACR needed for care gap

## 2024-06-14 NOTE — Progress Notes (Addendum)
 Bethany Martinez                                          MRN: [id number]   06/14/2024   The [facility] Quality Team Specialist reviewed this patient medical record for the purposes of chart review for care gap closure. The following were reviewed: chart review for care gap closure-kidney health evaluation for diabetes:eGFR  and uACR. AWV scheduled for 12/17, need uACR completed for gap closure.   07/27/2024- No uACR   [facility] Quality Team

## 2024-08-02 ENCOUNTER — Ambulatory Visit
Admission: RE | Admit: 2024-08-02 | Discharge: 2024-08-02 | Disposition: A | Discharge status: Home / Self Care | Source: Ambulatory Visit | Attending: Internal Medicine | Admitting: Internal Medicine

## 2024-08-02 DIAGNOSIS — Z1231 Encounter for screening mammogram for malignant neoplasm of breast: Secondary | ICD-10-CM | POA: Insufficient documentation

## 2024-08-05 ENCOUNTER — Ambulatory Visit

## 2024-08-05 VITALS — BP 140/80 | Ht 61.0 in | Wt 154.2 lb

## 2024-08-05 DIAGNOSIS — Z0000 Encounter for general adult medical examination without abnormal findings: Secondary | ICD-10-CM | POA: Diagnosis not present

## 2024-08-05 NOTE — Patient Instructions (Signed)
 Bethany Martinez,  Thank you for taking the time for your Medicare Wellness Visit. I appreciate your continued commitment to your health goals. Please review the care plan we discussed, and feel free to reach out if I can assist you further.  Please note that Annual Wellness Visits do not include a physical exam. Some assessments may be limited, especially if the visit was conducted virtually. If needed, we may recommend an in-person follow-up with your provider.  Ongoing Care Seeing your primary care provider every 3 to 6 months helps us  monitor your health and provide consistent, personalized care.   Referrals If a referral was made during today's visit and you haven't received any updates within two weeks, please contact the referred provider directly to check on the status.  Recommended Screenings:  Health Maintenance  Topic Date Due   Yearly kidney health urinalysis for diabetes  05/17/2020   Complete foot exam   05/17/2020   Eye exam for diabetics  08/28/2023   COVID-19 Vaccine (5 - 2025-26 season) 03/29/2024   Medicare Annual Wellness Visit  07/08/2024   Flu Shot  10/26/2024*   Hemoglobin A1C  11/30/2024   Yearly kidney function blood test for diabetes  06/02/2025   Breast Cancer Screening  08/02/2025   Colon Cancer Screening  07/12/2026   DTaP/Tdap/Td vaccine (3 - Td or Tdap) 04/30/2032   Pneumococcal Vaccine for age over 95  Completed   Osteoporosis screening with Bone Density Scan  Completed   Hepatitis C Screening  Completed   Zoster (Shingles) Vaccine  Completed   Meningitis B Vaccine  Aged Out  *Topic was postponed. The date shown is not the original due date.       08/05/2024    8:56 AM  Advanced Directives  Does Patient Have a Medical Advance Directive? Yes  Type of Estate Agent of [place];Living will  Copy of Healthcare Power of Attorney in Chart? No - copy requested    Vision: Annual vision screenings are recommended for early detection of  glaucoma, cataracts, and diabetic retinopathy. These exams can also reveal signs of chronic conditions such as diabetes and high blood pressure.  Dental: Annual dental screenings help detect early signs of oral cancer, gum disease, and other conditions linked to overall health, including heart disease and diabetes.

## 2024-08-05 NOTE — Progress Notes (Signed)
 "  Chief Complaint  Patient presents with   Medicare Wellness     Subjective:   Bethany Martinez is a 79 y.o. female who presents for a Medicare Annual Wellness Visit.  Visit info / Clinical Intake: Medicare Wellness Visit Type:: Subsequent Annual Wellness Visit Persons participating in visit and providing information:: patient Medicare Wellness Visit Mode:: In-person (required for WTM) Interpreter Needed?: No Pre-visit prep was completed: yes AWV questionnaire completed by patient prior to visit?: no Living arrangements:: (!) lives alone Patient's Overall Health Status Rating: good Typical amount of pain: none Does pain affect daily life?: no Are you currently prescribed opioids?: no  Dietary Habits and Nutritional Risks How many meals a day?: 3 Eats fruit and vegetables daily?: yes Most meals are obtained by: preparing own meals In the last 2 weeks, have you had any of the following?: none Diabetic:: no  Functional Status Activities of Daily Living (to include ambulation/medication): Independent Ambulation: Independent Medication Administration: Independent Home Management (perform basic housework or laundry): Independent Manage your own finances?: yes Primary transportation is: driving Concerns about vision?: (!) yes (eyes blurry at times since cataract surgery in 2024) Concerns about hearing?: no  Fall Screening Falls in the past year?: 0 Number of falls in past year: 0 Was there an injury with Fall?: 0 Fall Risk Category Calculator: 0 Patient Fall Risk Level: Low Fall Risk  Fall Risk Patient at Risk for Falls Due to: No Fall Risks Fall risk Follow up: Falls evaluation completed; Education provided; Falls prevention discussed  Home and Transportation Safety: All rugs have non-skid backing?: N/A, no rugs All stairs or steps have railings?: yes Grab bars in the bathtub or shower?: yes Have non-skid surface in bathtub or shower?: yes Good home lighting?:  yes Regular seat belt use?: yes Hospital stays in the last year:: no  Cognitive Assessment Difficulty concentrating, remembering, or making decisions? : yes Will 6CIT or Mini Cog be Completed: yes What year is it?: 0 points What month is it?: 0 points Give patient an address phrase to remember (5 components): [address] [place]  About what time is it?: 0 points Count backwards from 20 to 1: 0 points Say the months of the year in reverse: 0 points Repeat the address phrase from earlier: 4 points 6 CIT Score: 4 points  Advance Directives (For Healthcare) Does Patient Have a Medical Advance Directive?: Yes Type of Advance Directive: Healthcare Power of [place]; Living will Copy of Healthcare Power of Attorney in Chart?: No - copy requested Copy of Living Will in Chart?: No - copy requested  Reviewed/Updated  Reviewed/Updated: Reviewed All (Medical, Surgical, Family, Medications, Allergies, Care Teams, Patient Goals)    Allergies (verified) Atorvastatin    Current Medications (verified) Outpatient Encounter Medications as of 08/05/2024  Medication Sig   aspirin EC 81 MG tablet Take 81 mg by mouth daily. Patient taking 3 times per week   Calcium  Carb-Cholecalciferol (SUPER CALCIUM  600 + D 400 PO) Take 1 tablet by mouth 2 (two) times daily.   Garlic 1000 MG CAPS Take by mouth daily.   hydrochlorothiazide  (HYDRODIURIL ) 25 MG tablet TAKE 1 TABLET BY MOUTH DAILY   losartan  (COZAAR ) 100 MG tablet Take 1 tablet (100 mg total) by mouth daily.   rosuvastatin  (CRESTOR ) 5 MG tablet TAKE 1 TABLET BY MOUTH 3 TIMES  WEEKLY   triamcinolone  ointment (KENALOG ) 0.1 % Apply 1 Application topically 2 (two) times daily. To knees and elbows   TURMERIC CURCUMIN PO Take by mouth.  latanoprost (XALATAN) 0.005 % ophthalmic solution SMARTSIG:In Eye(s) (Patient not taking: Reported on 08/05/2024)   Omega-3 Fatty Acids (OMEGA 3 500 PO) Take by mouth daily. (Patient not taking: Reported on 08/05/2024)    No facility-administered encounter medications on file as of 08/05/2024.    History: Past Medical History:  Diagnosis Date   Allergy    Diabetes mellitus without complication (HCC)    borderline   Encounter for dental exam and cleaning w/o abnormal findings 05/18/2018   Glaucoma    Hyperlipidemia    Hypertension    Past Surgical History:  Procedure Laterality Date   ABDOMINAL HYSTERECTOMY  1988   partial   CATARACT EXTRACTION W/PHACO Left 12/09/2022   Procedure: CATARACT EXTRACTION PHACO AND INTRAOCULAR LENS PLACEMENT (IOC) LEFT HYDRUS MICROSTENT  CLAREON VIVITY TORIC LENS  3.89  00:31.1;  Surgeon: Myrna Adine Anes, MD;  Location: [facility];  Service: Ophthalmology;  Laterality: Left;   CATARACT EXTRACTION W/PHACO Right 01/13/2023   Procedure: CATARACT EXTRACTION PHACO AND INTRAOCULAR LENS PLACEMENT (IOC) RIGHT HYDRUS MICROSTENT  CLAREON VIVITY  LENS  3.28  00:23.8;  Surgeon: Myrna Adine Anes, MD;  Location: [facility];  Service: Ophthalmology;  Laterality: Right;   COLONOSCOPY  01/2009   Dr. Dellie   COLONOSCOPY WITH PROPOFOL  N/A 07/12/2021   Procedure: COLONOSCOPY WITH BIOPSY;  Surgeon: Unk Corinn Skiff, MD;  Location: [facility];  Service: Endoscopy;  Laterality: N/A;   POLYPECTOMY N/A 07/12/2021   Procedure: POLYPECTOMY;  Surgeon: Unk Corinn Skiff, MD;  Location: [facility];  Service: Endoscopy;  Laterality: N/A;   Family History  Problem Relation Age of Onset   Hypertension Mother    Hypertension Father    Breast cancer Sister 29   Cancer Sister        colon   Dementia Sister    Breast cancer Paternal Aunt 56   Heart disease Brother        CABG   Social History   Occupational History   Occupation: Retired  Tobacco Use   Smoking status: Never   Smokeless tobacco: Never   Tobacco comments:    smoking cessation materials not required  Vaping Use   Vaping status: Never Used  Substance and Sexual Activity   Alcohol  use: No    Alcohol/week: 0.0 standard drinks of alcohol   Drug use: No   Sexual activity: Not Currently   Tobacco Counseling Counseling given: Not Answered Tobacco comments: smoking cessation materials not required  SDOH Screenings   Food Insecurity: No Food Insecurity (08/05/2024)  Housing: Unknown (08/05/2024)  Transportation Needs: No Transportation Needs (08/05/2024)  Utilities: Not At Risk (08/05/2024)  Alcohol Screen: Low Risk (07/09/2023)  Depression (PHQ2-9): Low Risk (08/05/2024)  Financial Resource Strain: Low Risk (07/09/2023)  Physical Activity: Insufficiently Active (08/05/2024)  Social Connections: Moderately Integrated (08/05/2024)  Stress: No Stress Concern Present (08/05/2024)  Tobacco Use: Low Risk (08/05/2024)  Health Literacy: Adequate Health Literacy (08/05/2024)   See flowsheets for full screening details  Depression Screen PHQ 2 & 9 Depression Scale- Over the past 2 weeks, how often have you been bothered by any of the following problems? Little interest or pleasure in doing things: 0 Feeling down, depressed, or hopeless (PHQ Adolescent also includes...irritable): 0 PHQ-2 Total Score: 0 Trouble falling or staying asleep, or sleeping too much: 0 Feeling tired or having little energy: 0 Poor appetite or overeating (PHQ Adolescent also includes...weight loss): 0 Feeling bad about yourself - or that you are a failure or  have let yourself or your family down: 0 Trouble concentrating on things, such as reading the newspaper or watching television (PHQ Adolescent also includes...like school work): 0 Moving or speaking so slowly that other people could have noticed. Or the opposite - being so fidgety or restless that you have been moving around a lot more than usual: 0 Thoughts that you would be better off dead, or of hurting yourself in some way: 0 PHQ-9 Total Score: 0 If you checked off any problems, how difficult have these problems made it for you to do your work, take care of  things at home, or get along with other people?: Not difficult at all     Goals Addressed             This Visit's Progress    DIET - EAT MORE FRUITS AND VEGETABLES   On track    DIET - INCREASE WATER  INTAKE   On track    Recommend to drink at least 6-8 8oz glasses of water  per day.     Increase physical activity   On track    Recommend increasing physical activity to 150 minutes per week             Objective:    Today's Vitals   08/05/24 0853 08/05/24 0913  BP: (!) 148/81 (!) 140/80  Weight: 154 lb 3.4 oz (69.9 kg)   Height: 5' 1 (1.549 m)    Body mass index is 29.14 kg/m.  Hearing/Vision screen No results found. Immunizations and Health Maintenance Health Maintenance  Topic Date Due   Diabetic kidney evaluation - Urine ACR  05/17/2020   FOOT EXAM  05/17/2020   OPHTHALMOLOGY EXAM  08/28/2023   COVID-19 Vaccine (5 - 2025-26 season) 03/29/2024   Medicare Annual Wellness (AWV)  07/08/2024   Influenza Vaccine  10/26/2024 (Originally 02/27/2024)   HEMOGLOBIN A1C  11/30/2024   Diabetic kidney evaluation - eGFR measurement  06/02/2025   Mammogram  08/02/2025   Colonoscopy  07/12/2026   DTaP/Tdap/Td (3 - Td or Tdap) 04/30/2032   Pneumococcal Vaccine: 50+ Years  Completed   Bone Density Scan  Completed   Hepatitis C Screening  Completed   Zoster Vaccines- Shingrix   Completed   Meningococcal B Vaccine  Aged Out        Assessment/Plan:  This is a routine wellness examination for Bethany Martinez.  Patient Care Team: Justus Leita DEL, MD as PCP - General (Internal Medicine) Myrna Adine Anes, MD as Consulting Physician (Ophthalmology) Edda Mt, MD (Otolaryngology)  I have personally reviewed and noted the following in the patients chart:   Medical and social history Use of alcohol, tobacco or illicit drugs  Current medications and supplements including opioid prescriptions. Functional ability and status Nutritional status Physical activity Advanced  directives List of other physicians Hospitalizations, surgeries, and ER visits in previous 12 months Vitals Screenings to include cognitive, depression, and falls Referrals and appointments  No orders of the defined types were placed in this encounter.  In addition, I have reviewed and discussed with patient certain preventive protocols, quality metrics, and best practice recommendations. A written personalized care plan for preventive services as well as general preventive health recommendations were provided to patient.   Bethany LITTIE Saris, LPN   02/27/7972   No follow-ups on file.  After Visit Summary: (In Person-Declined) Patient declined AVS at this time.  Nurse Notes: No voiced or noted concerns at this time Patient advised to keep follow-up appointment with PCP (May 2026) Appointment(s)  made: (Jan 2027) HM Addressed: pt says she just had urine ACR and foot exam done-has appt w/new PCP in May 2026 Pt has eye appt with Dr Myrna on 08/24/24  "

## 2024-08-25 ENCOUNTER — Other Ambulatory Visit: Payer: Self-pay

## 2024-08-25 DIAGNOSIS — I10 Essential (primary) hypertension: Secondary | ICD-10-CM

## 2024-08-25 MED ORDER — HYDROCHLOROTHIAZIDE 25 MG PO TABS: 25.0000 mg | ORAL_TABLET | Freq: Every day | ORAL | 0 refills | Status: AC

## 2024-08-26 ENCOUNTER — Other Ambulatory Visit: Payer: Self-pay | Admitting: Internal Medicine

## 2024-08-26 DIAGNOSIS — I10 Essential (primary) hypertension: Secondary | ICD-10-CM

## 2024-08-26 NOTE — Telephone Encounter (Signed)
 Copied from CRM #[id number]. Topic: Clinical - Medication Refill >> Aug 26, 2024 10:18 AM Laymon HERO wrote: Medication: hydrochlorothiazide  (HYDRODIURIL ) 25 MG tablet ;  losartan  (COZAAR ) 100 MG tablet   Has the patient contacted their pharmacy? Yes (Agent: If no, request that the patient contact the pharmacy for the refill. If patient does not wish to contact the pharmacy document the reason why and proceed with request.) (Agent: If yes, when and what did the pharmacy advise?)  This is the patient's preferred pharmacy:   [facility] - [place], [place] - [address] [place] [address] Phone: [phone] Fax: [phone]  Is this the correct pharmacy for this prescription? Yes If no, delete pharmacy and type the correct one.   Has the prescription been filled recently? Yes  Is the patient out of the medication? Yes  Has the patient been seen for an appointment in the last year OR does the patient have an upcoming appointment? Yes  Can we respond through MyChart? Yes  Agent: Please be advised that Rx refills may take up to 3 business days. We ask that you follow-up with your pharmacy.

## 2024-08-27 MED ORDER — LOSARTAN POTASSIUM 100 MG PO TABS: 100.0000 mg | ORAL_TABLET | Freq: Every day | ORAL | 0 refills | Status: AC

## 2024-12-02 ENCOUNTER — Ambulatory Visit: Admitting: Family Medicine

## 2025-08-18 ENCOUNTER — Ambulatory Visit
